# Patient Record
Sex: Female | Born: 1945 | Race: White | Hispanic: No | Marital: Married | Smoking: Never smoker
Health system: Southern US, Community
[De-identification: ages and names within clinical notes are randomized; demographics above are authoritative.]

## PROBLEM LIST (undated history)

## (undated) DIAGNOSIS — K2100 Gastro-esophageal reflux disease with esophagitis, without bleeding: Secondary | ICD-10-CM

## (undated) DIAGNOSIS — M069 Rheumatoid arthritis, unspecified: Secondary | ICD-10-CM

## (undated) DIAGNOSIS — J45909 Unspecified asthma, uncomplicated: Secondary | ICD-10-CM

## (undated) DIAGNOSIS — K5730 Diverticulosis of large intestine without perforation or abscess without bleeding: Secondary | ICD-10-CM

## (undated) DIAGNOSIS — E669 Obesity, unspecified: Secondary | ICD-10-CM

## (undated) DIAGNOSIS — T7840XA Allergy, unspecified, initial encounter: Secondary | ICD-10-CM

## (undated) DIAGNOSIS — M810 Age-related osteoporosis without current pathological fracture: Secondary | ICD-10-CM

## (undated) DIAGNOSIS — C4491 Basal cell carcinoma of skin, unspecified: Secondary | ICD-10-CM

## (undated) DIAGNOSIS — E7800 Pure hypercholesterolemia, unspecified: Secondary | ICD-10-CM

## (undated) DIAGNOSIS — Z86010 Personal history of colonic polyps: Secondary | ICD-10-CM

## (undated) DIAGNOSIS — D509 Iron deficiency anemia, unspecified: Secondary | ICD-10-CM

## (undated) DIAGNOSIS — E039 Hypothyroidism, unspecified: Secondary | ICD-10-CM

## (undated) DIAGNOSIS — I10 Essential (primary) hypertension: Secondary | ICD-10-CM

## (undated) DIAGNOSIS — M1990 Unspecified osteoarthritis, unspecified site: Secondary | ICD-10-CM

## (undated) DIAGNOSIS — Z860101 Personal history of adenomatous and serrated colon polyps: Secondary | ICD-10-CM

## (undated) DIAGNOSIS — K219 Gastro-esophageal reflux disease without esophagitis: Secondary | ICD-10-CM

## (undated) HISTORY — DX: Basal cell carcinoma of skin, unspecified: C44.91

## (undated) HISTORY — DX: Age-related osteoporosis without current pathological fracture: M81.0

## (undated) HISTORY — PX: ENDOMETRIAL ABLATION: SHX621

## (undated) HISTORY — DX: Allergy, unspecified, initial encounter: T78.40XA

## (undated) HISTORY — PX: TONSILLECTOMY: SUR1361

## (undated) HISTORY — DX: Obesity, unspecified: E66.9

## (undated) HISTORY — DX: Iron deficiency anemia, unspecified: D50.9

## (undated) HISTORY — PX: UPPER GASTROINTESTINAL ENDOSCOPY: SHX188

## (undated) HISTORY — DX: Gastro-esophageal reflux disease with esophagitis, without bleeding: K21.00

## (undated) HISTORY — DX: Rheumatoid arthritis, unspecified: M06.9

## (undated) HISTORY — DX: Pure hypercholesterolemia, unspecified: E78.00

## (undated) HISTORY — DX: Personal history of adenomatous and serrated colon polyps: Z86.0101

## (undated) HISTORY — DX: Gastro-esophageal reflux disease with esophagitis: K21.0

## (undated) HISTORY — PX: COLONOSCOPY: SHX174

## (undated) HISTORY — DX: Personal history of colonic polyps: Z86.010

## (undated) HISTORY — PX: CERVICAL CONE BIOPSY: SUR198

## (undated) HISTORY — PX: CATARACT EXTRACTION, BILATERAL: SHX1313

## (undated) HISTORY — DX: Gastro-esophageal reflux disease without esophagitis: K21.9

## (undated) HISTORY — DX: Unspecified asthma, uncomplicated: J45.909

## (undated) HISTORY — DX: Hypothyroidism, unspecified: E03.9

## (undated) HISTORY — DX: Essential (primary) hypertension: I10

## (undated) HISTORY — DX: Diverticulosis of large intestine without perforation or abscess without bleeding: K57.30

## (undated) HISTORY — DX: Unspecified osteoarthritis, unspecified site: M19.90

---

## 1998-01-29 ENCOUNTER — Ambulatory Visit (HOSPITAL_COMMUNITY): Admission: RE | Admit: 1998-01-29 | Discharge: 1998-01-29 | Payer: Self-pay | Admitting: Obstetrics and Gynecology

## 1998-01-29 ENCOUNTER — Encounter: Payer: Self-pay | Admitting: Obstetrics and Gynecology

## 1998-03-06 ENCOUNTER — Other Ambulatory Visit: Admission: RE | Admit: 1998-03-06 | Discharge: 1998-03-06 | Payer: Self-pay | Admitting: Obstetrics and Gynecology

## 1998-07-17 ENCOUNTER — Other Ambulatory Visit: Admission: RE | Admit: 1998-07-17 | Discharge: 1998-07-17 | Payer: Self-pay | Admitting: Obstetrics and Gynecology

## 1999-04-15 ENCOUNTER — Other Ambulatory Visit: Admission: RE | Admit: 1999-04-15 | Discharge: 1999-04-15 | Payer: Self-pay | Admitting: Obstetrics and Gynecology

## 1999-06-05 ENCOUNTER — Encounter: Payer: Self-pay | Admitting: Obstetrics and Gynecology

## 1999-06-05 ENCOUNTER — Ambulatory Visit (HOSPITAL_COMMUNITY): Admission: RE | Admit: 1999-06-05 | Discharge: 1999-06-05 | Payer: Self-pay | Admitting: Obstetrics and Gynecology

## 2000-04-26 ENCOUNTER — Other Ambulatory Visit: Admission: RE | Admit: 2000-04-26 | Discharge: 2000-04-26 | Payer: Self-pay | Admitting: Obstetrics and Gynecology

## 2000-10-06 ENCOUNTER — Ambulatory Visit (HOSPITAL_COMMUNITY): Admission: RE | Admit: 2000-10-06 | Discharge: 2000-10-06 | Payer: Self-pay | Admitting: Obstetrics and Gynecology

## 2000-10-06 ENCOUNTER — Encounter: Payer: Self-pay | Admitting: Obstetrics and Gynecology

## 2001-10-12 ENCOUNTER — Ambulatory Visit (HOSPITAL_COMMUNITY): Admission: RE | Admit: 2001-10-12 | Discharge: 2001-10-12 | Payer: Self-pay | Admitting: Obstetrics and Gynecology

## 2001-10-12 ENCOUNTER — Encounter: Payer: Self-pay | Admitting: Obstetrics and Gynecology

## 2003-06-11 ENCOUNTER — Other Ambulatory Visit: Admission: RE | Admit: 2003-06-11 | Discharge: 2003-06-11 | Payer: Self-pay | Admitting: Obstetrics and Gynecology

## 2004-04-16 ENCOUNTER — Ambulatory Visit: Payer: Self-pay | Admitting: Internal Medicine

## 2005-02-16 ENCOUNTER — Ambulatory Visit: Payer: Self-pay | Admitting: Internal Medicine

## 2005-05-21 ENCOUNTER — Ambulatory Visit: Payer: Self-pay | Admitting: Internal Medicine

## 2005-06-04 ENCOUNTER — Ambulatory Visit: Payer: Self-pay | Admitting: Internal Medicine

## 2005-06-04 ENCOUNTER — Encounter (INDEPENDENT_AMBULATORY_CARE_PROVIDER_SITE_OTHER): Payer: Self-pay | Admitting: *Deleted

## 2005-06-04 DIAGNOSIS — K5730 Diverticulosis of large intestine without perforation or abscess without bleeding: Secondary | ICD-10-CM | POA: Insufficient documentation

## 2005-06-04 DIAGNOSIS — K210 Gastro-esophageal reflux disease with esophagitis: Secondary | ICD-10-CM

## 2006-10-31 ENCOUNTER — Ambulatory Visit: Payer: Self-pay | Admitting: Internal Medicine

## 2007-01-24 ENCOUNTER — Ambulatory Visit: Payer: Self-pay | Admitting: Internal Medicine

## 2007-04-25 ENCOUNTER — Ambulatory Visit: Payer: Self-pay | Admitting: Family Medicine

## 2007-05-04 DIAGNOSIS — E669 Obesity, unspecified: Secondary | ICD-10-CM

## 2007-05-16 ENCOUNTER — Ambulatory Visit: Payer: Self-pay | Admitting: Family Medicine

## 2007-06-23 DIAGNOSIS — J302 Other seasonal allergic rhinitis: Secondary | ICD-10-CM

## 2007-06-23 DIAGNOSIS — E7800 Pure hypercholesterolemia, unspecified: Secondary | ICD-10-CM

## 2007-06-23 DIAGNOSIS — E039 Hypothyroidism, unspecified: Secondary | ICD-10-CM

## 2007-06-23 DIAGNOSIS — J3089 Other allergic rhinitis: Secondary | ICD-10-CM

## 2007-06-23 DIAGNOSIS — Z85828 Personal history of other malignant neoplasm of skin: Secondary | ICD-10-CM | POA: Insufficient documentation

## 2007-06-23 DIAGNOSIS — I10 Essential (primary) hypertension: Secondary | ICD-10-CM

## 2007-08-29 ENCOUNTER — Telehealth: Payer: Self-pay | Admitting: Internal Medicine

## 2007-09-18 ENCOUNTER — Telehealth: Payer: Self-pay | Admitting: Internal Medicine

## 2007-09-19 ENCOUNTER — Encounter: Payer: Self-pay | Admitting: Internal Medicine

## 2007-12-25 ENCOUNTER — Telehealth: Payer: Self-pay | Admitting: Internal Medicine

## 2008-03-29 ENCOUNTER — Telehealth: Payer: Self-pay | Admitting: Internal Medicine

## 2008-08-28 ENCOUNTER — Telehealth: Payer: Self-pay | Admitting: Internal Medicine

## 2008-09-26 ENCOUNTER — Encounter: Payer: Self-pay | Admitting: Internal Medicine

## 2008-09-27 ENCOUNTER — Encounter: Payer: Self-pay | Admitting: Internal Medicine

## 2008-09-30 ENCOUNTER — Telehealth: Payer: Self-pay | Admitting: Internal Medicine

## 2008-11-06 ENCOUNTER — Telehealth: Payer: Self-pay | Admitting: Internal Medicine

## 2008-11-11 ENCOUNTER — Ambulatory Visit: Payer: Self-pay | Admitting: Internal Medicine

## 2008-11-11 DIAGNOSIS — M0579 Rheumatoid arthritis with rheumatoid factor of multiple sites without organ or systems involvement: Secondary | ICD-10-CM

## 2008-11-14 ENCOUNTER — Encounter: Payer: Self-pay | Admitting: Internal Medicine

## 2009-06-16 ENCOUNTER — Encounter: Payer: Self-pay | Admitting: Internal Medicine

## 2010-02-25 ENCOUNTER — Ambulatory Visit: Payer: Self-pay | Admitting: Internal Medicine

## 2010-04-23 NOTE — Assessment & Plan Note (Signed)
Summary: sick per pt//kcw   Copy to:  n/a Primary Provider/Referring Provider:  Tracey Harries, MD  CC:  Acute visit-has been using Neti pot; congestion and cough increased.Marland Kitchen  History of Present Illness: February 25, 2010- Allergic rhinitis, Cough 64 yo never smoker wih hx allergic rhinitis, asthma and GERD, previously followed at [facility]. Old chart reviewed.  Acute visit- Dry cough since she started lisinopril in January. Cough more frequent and also nasal stuffiness x 3 weeks. More recent sneeze and yellow nasal. Had some sore throat and frontal headache. Neti pot not helping. No hx asthma, but a little cough or wheeze if she laughs. Prilosec controls reflux.   Preventive Screening-Counseling & Management  Alcohol-Tobacco     Smoking Status: never  Current Medications (verified): 1)  Prilosec Otc 20 Mg  Tbec (Omeprazole Magnesium) .Marland Kitchen.. 1 Twice A Day 30 Minutes Before Meals 2)  Plaquenil 200 Mg Tabs (Hydroxychloroquine Sulfate) .... One Tablet By Mouth Two Times A Day 3)  Red Yeast Rice 600 Mg Caps (Red Yeast Rice Extract) .... Take 1 By Mouth Once Daily 4)  Multivitamins   Tabs (Multiple Vitamin) .... One Tablet By Mouth Once Daily 5)  Fish Oil   Oil (Fish Oil) .... One Tablet By Mouth Once Daily 6)  Liothyronnie Sodium(T3)thyroxine L (T4) (Compound Mixed) .... Take 1 By Mouth  Every Morning 7)  Osteohealth 4 .... Take 1 By Mouth Once Daily 8)  Vitamin D-3 5000 Unit Tabs (Cholecalciferol) .... Take 1 By Mouth Twice A Week 9)  Glucosamine Sulfate 750 Mg Tabs (Glucosamine Sulfate) .... Take 1 By Mouth Once Daily 10)  Msm 1000 Mg Caps (Methylsulfonylmethane) .... Take 1 By Mouth Once Daily 11)  Lisinopril 5 Mg Tabs (Lisinopril) .... Take 1 By Mouth Once Daily  Allergies (verified): 1)  ! Tetracycline 2)  ! Neosporin  Past History:  Past Surgical History: Last updated: 06/23/2007 Tonsillectomy  Family History: Last updated: 02/25/2010 Family History of Colon Cancer: Maternal 1st  Cousin Family History of Diabetes: Maternal Grandmother Family History of Heart Disease: Mother Jerrye Beavers) Father- died 87 of CVA and Parkinson's with pneumonia Brother- died 40 of lung cancer/ smoker  Social History: Last updated: 11/07/2008 Occupation: Education Alcohol Use - no Illicit Drug Use - no  Risk Factors: Smoking Status: never (02/25/2010)  Past Medical History:   ALLERGY (ICD-995.3)- Allergy vaccine 1994 CARCINOMA, BASAL CELL (ICD-173.9) HYPOTHYROIDISM (ICD-244.9) HYPERCHOLESTEROLEMIA (ICD-272.0) HYPERTENSION (ICD-401.9) DIVERTICULOSIS, COLON (ICD-562.10) ESOPHAGITIS, REFLUX (ICD-530.11) OBESITY, UNSPECIFIED (ICD-278.00)  Family History: Family History of Colon Cancer: Maternal 1st Cousin Family History of Diabetes: Maternal Grandmother Family History of Heart Disease: Mother Jerrye Beavers) Father- died 25 of CVA and Parkinson's with pneumonia Brother- died 23 of lung cancer/ smoker  Social History: Smoking Status:  never  Review of Systems      See HPI       The patient complains of headaches, nasal congestion/difficulty breathing through nose, and sneezing.  The patient denies shortness of breath with activity, shortness of breath at rest, productive cough, non-productive cough, coughing up blood, chest pain, irregular heartbeats, acid heartburn, indigestion, loss of appetite, weight change, abdominal pain, difficulty swallowing, sore throat, and tooth/dental problems.    Vital Signs:  Patient profile:   65 year old female Height:      63 inches Weight:      188.13 pounds BMI:     33.45 O2 Sat:      94 % on Room air Pulse rate:   75 / minute BP sitting:  142 / 74  (left arm) Cuff size:   regular  Vitals Entered By: Reynaldo Minium CMA (February 25, 2010 10:44 AM)  O2 Flow:  Room air CC: Acute visit-has been using Neti pot; congestion, cough increased.   Physical Exam  Additional Exam:  General: A/Ox3; pleasant and cooperative, NAD, overweight,  NAD SKIN: no rash, lesions NODES: no lymphadenopathy HEENT: [place]/AT, EOM- WNL, Conjuctivae- clear, PERRLA, TM-WNL, Nose- clear, Throat- patchy red, no drainage, Mallampati  II NECK: Supple w/ fair ROM, JVD- none, normal carotid impulses w/o bruits Thyroid- normal to palpation CHEST: Clear to P&A HEART: RRR, no m/g/r heard ABDOMEN: Soft and nl; nml bowel sounds; no organomegaly or masses noted ZOX:WRUE, nl pulses, no edema  NEURO: Grossly intact to observation       Impression & Recommendations:  Problem # 1:  ALLERGY (ICD-995.3)  Allergic rhinitis- seasonal issues.  She may be getting a mild sinus infection. If we can't open her readily, she may need an antibiotic. will give neb and depo, with script for Z pak. Consider decongestant despite BP  We discussed lisininopril. She will contact Dr  Everlene Other for change from lisinopril  Medications Added to Medication List This Visit: 1)  Red Yeast Rice 600 Mg Caps (Red yeast rice extract) .... Take 1 by mouth once daily 2)  Liothyronnie Sodium(t3)thyroxine L (t4) (compound Mixed)  .... Take 1 by mouth  every morning 3)  Osteohealth 4  .... Take 1 by mouth once daily 4)  Vitamin D-3 5000 Unit Tabs (Cholecalciferol) .... Take 1 by mouth twice a week 5)  Glucosamine Sulfate 750 Mg Tabs (Glucosamine sulfate) .... Take 1 by mouth once daily 6)  Msm 1000 Mg Caps (Methylsulfonylmethane) .... Take 1 by mouth once daily 7)  Lisinopril 5 Mg Tabs (Lisinopril) .... Take 1 by mouth once daily 8)  Zithromax Z-pak 250 Mg Tabs (Azithromycin) .... 2 today then one daily  Other Orders: Est. Patient Level III (45409) Depo- Medrol 80mg  (J1040) Admin of Therapeutic Inj  intramuscular or subcutaneous (81191) Nebulizer Tx (47829)  Patient Instructions: 1)  Please schedule a follow-up appointment in 1 year. 2)  Please call Dr Everlene Other and explain that the lisinopril is causing persistent cough and you would like to change from that.  3)  neb neo nasal 4)   depo 80 5)  script to hold for Z pak antibiotic Prescriptions: ZITHROMAX Z-PAK 250 MG TABS (AZITHROMYCIN) 2 today then one daily  #1 pak x 0   Entered and Authorized by:   Waymon Budge MD   Signed by:   Waymon Budge MD on 02/25/2010   Method used:   Print then Give to Patient   RxID:   [id number]      Medication Administration  Injection # 1:    Medication: Depo- Medrol 80mg     Diagnosis: ALLERGY (ICD-995.3)    Route: IM    Site: RUOQ gluteus    Exp Date: 07/2012    Lot #: [id number]    Mfr: Pharmacia    Patient tolerated injection without complications    Given by: Elray Buba RN (February 25, 2010 11:49 AM)  Medication # 1:    Medication: EMR miscellaneous medications    Diagnosis: ALLERGY (ICD-995.3)    Dose: 3 gtts.    Route: intranasal    Exp Date: 04/13    Lot #: [id number]    Mfr: BAYER    Comments: Neo-Synephrine    Patient tolerated medication without complications  Given by: Elray Buba RN (February 25, 2010 11:51 AM)

## 2010-04-23 NOTE — Miscellaneous (Signed)
Summary: Prilosec Rx  Clinical Lists Changes  Medications: Rx of PRILOSEC OTC 20 MG  TBEC (OMEPRAZOLE MAGNESIUM) 1 twice a day 30 minutes before meals;  #60 x 4;  Signed;  Entered by: Hortense Ramal CMA (AAMA);  Authorized by: Hart Carwin MD;  Method used: Electronically to [facility] [address], [place], [place]  [id number], Ph: [phone], Fax: [phone]    Prescriptions: PRILOSEC OTC 20 MG  TBEC (OMEPRAZOLE MAGNESIUM) 1 twice a day 30 minutes before meals  #60 x 4   Entered by:   Hortense Ramal CMA (AAMA)   Authorized by:   Hart Carwin MD   Signed by:   Hortense Ramal CMA (AAMA) on 06/16/2009   Method used:   Electronically to        [facility]* (retail)       [address]       [place], [place]  [id number]       Ph: [phone]       Fax: [phone]   RxID:   [id number]

## 2010-08-04 NOTE — Assessment & Plan Note (Signed)
[facility]                         GASTROENTEROLOGY OFFICE NOTE   NAME:Sally Diaz, Sally Diaz                     MRN:          [id number]  DATE:10/31/2006                            DOB:          [birth year]    Ms. Colvin is a 65 year old white female with a history of  gastroesophageal reflux, asthmatic bronchitis and history of colon  polyps removed on colonoscopy by Dr. Madilyn Fireman in 2004.  She has since then  had a repeat colonoscopy in March of 2003 with findings of moderate to  severe diverticulosis of the left colon, but no evidence of recurrent  polyps.  Her recall colonoscopy was set for a 7-year interval, which  would be March 2014.  As far as her gastroesophageal reflux is  concerned, it is well controlled on Aciphex 20 mg daily.  She  discontinued the Aciphex for a few days, only to find out that she has  recurrent reflux.  She denies any nocturnal coughing, hoarseness or food  regurgitation, as long as she takes the Aciphex.  On upper endoscopy on  June 04, 2005, biopsies of the GE junction showed minimal inflammation,  consistent with gastroesophageal reflux but no intestinal metaplasia.   PHYSICAL EXAMINATION:  VITAL SIGNS:  Blood pressure 130/78, pulse 68.  Weight 188 pounds.  The patient was not re-examined today.   IMPRESSION:  A 65 year old white female with chronic gastroesophageal  reflux disease without histologic evidence of Barrett's esophagus.  The  patient is dependent on a proton pump inhibitor to remain her in  remission.   PLAN:  Refill for Aciphex 20 mg daily.  We will send it by E-prescibing  to [facility].  She was also interested in starting 3 months of  mail order Aciphex, and I gave her a prescription for that.  She will  return in 1 year.     Hedwig Morton. Juanda Chance, MD  Electronically Signed    DMB/MedQ  DD: 10/31/2006  DT: 11/01/2006  Job #: [id number]   cc:   Buren Kos, M.D.

## 2011-01-05 ENCOUNTER — Other Ambulatory Visit: Payer: Self-pay | Admitting: *Deleted

## 2011-01-05 MED ORDER — OMEPRAZOLE MAGNESIUM 20 MG PO TBEC: 20.0000 mg | DELAYED_RELEASE_TABLET | Freq: Two times a day (BID) | ORAL | Status: DC

## 2011-02-24 ENCOUNTER — Encounter: Payer: Self-pay | Admitting: Internal Medicine

## 2011-02-25 ENCOUNTER — Ambulatory Visit: Payer: Self-pay | Admitting: Internal Medicine

## 2011-03-12 ENCOUNTER — Encounter: Payer: Self-pay | Admitting: *Deleted

## 2011-03-19 ENCOUNTER — Ambulatory Visit (INDEPENDENT_AMBULATORY_CARE_PROVIDER_SITE_OTHER): Payer: Medicare Other | Admitting: Internal Medicine

## 2011-03-19 ENCOUNTER — Encounter: Payer: Self-pay | Admitting: Internal Medicine

## 2011-03-19 VITALS — BP 132/76 | HR 88 | Ht 63.0 in | Wt 187.0 lb

## 2011-03-19 DIAGNOSIS — K219 Gastro-esophageal reflux disease without esophagitis: Secondary | ICD-10-CM

## 2011-03-19 MED ORDER — OMEPRAZOLE MAGNESIUM 20 MG PO TBEC: 20.0000 mg | DELAYED_RELEASE_TABLET | Freq: Two times a day (BID) | ORAL | Status: DC

## 2011-03-19 NOTE — Progress Notes (Signed)
Sally Diaz Aug 05, 1945 MRN [id number]    History of Present Illness:  This is a 65 year old white female with chronic gastroesophageal reflux controlled on Prilosec 20 mg once a day or twice a day. Her last upper endoscopy in March 2007 showed esophagitis. There was no Barrett's esophagus. Her symptoms are perfectly well controlled on Prilosec. She needs a refill today. She was diagnosed with rheumatoid arthritis and has been started on Plaquenil and most recently low-dose prednisone which will be completed tomorrow. She has a history of adenomatous polyp of the colon and a family history of colon cancer in a first cousin. She had moderately severe diverticulosis on a colonoscopy in March 2007.   Past Medical History  Diagnosis Date  . Allergy, unspecified not elsewhere classified   . Basal cell carcinoma   . Hypothyroidism   . Hypercholesterolemia   . Hypertension   . Diverticulosis of colon   . Reflux esophagitis   . Obesity   . Hx of adenomatous colonic polyps   . Anemia, iron deficiency    Past Surgical History  Procedure Date  . Tonsillectomy     reports that she has never smoked. She has never used smokeless tobacco. She reports that she drinks alcohol. She reports that she does not use illicit drugs. family history includes Cancer in her mother; Colon cancer in an unspecified family member; Diabetes in her maternal grandmother; Heart disease in her mother; Lung cancer in her brother; Pneumonia in her father; and Stroke in her father. Allergies  Allergen Reactions  . Tetracycline   . Triple Antibiotic         Review of Systems: Denies dysphagia, odynophagia, chest pain or shortness of breath  The remainder of the 10 point ROS is negative except as outlined in H&P   Physical Exam: General appearance  Well developed, in no distress. Eyes- non icteric. HEENT nontraumatic, normocephalic. Mouth no lesions, tongue papillated, no cheilosis. Neck supple without  adenopathy, thyroid not enlarged, no carotid bruits, no JVD. Lungs Clear to auscultation bilaterally. Cor normal S1, normal S2, regular rhythm, no murmur,  quiet precordium. Abdomen: Soft, nontender. Rectal: Not done Extremities no pedal edema. Skin no lesions. Neurological alert and oriented x 3. Psychological normal mood and affect.  Assessment and Plan:  Problem #1 Chronic gastroesophageal reflux disease controlled on Prilosec 20 mg twice a day. We will refill her Prilosec today. She will continue antireflux measures.  Problem #2 Colorectal screening. Patient has a personal history of adenomatous polyps. She will be due for a recall colonoscopy in March 2014.   03/19/2011 Lina Sar

## 2011-03-19 NOTE — Patient Instructions (Addendum)
We have sent the following medications to your pharmacy for you to pick up at your convenience: Prilosec If you have problems with your Prilosec prescription, please call Dottie at [phone] and I will contact the pharmacy. CC: Dr Everlene Other, Dr Corliss Skains

## 2011-09-02 ENCOUNTER — Other Ambulatory Visit: Payer: Self-pay | Admitting: Internal Medicine

## 2012-01-12 ENCOUNTER — Other Ambulatory Visit: Payer: Self-pay | Admitting: *Deleted

## 2012-01-12 MED ORDER — OMEPRAZOLE MAGNESIUM 20 MG PO TBEC: 20.0000 mg | DELAYED_RELEASE_TABLET | Freq: Two times a day (BID) | ORAL | Status: DC

## 2012-05-12 ENCOUNTER — Other Ambulatory Visit: Payer: Self-pay | Admitting: Internal Medicine

## 2012-05-19 ENCOUNTER — Encounter: Payer: Self-pay | Admitting: *Deleted

## 2012-05-25 ENCOUNTER — Encounter: Payer: Self-pay | Admitting: Internal Medicine

## 2012-05-28 ENCOUNTER — Other Ambulatory Visit: Payer: Self-pay | Admitting: Internal Medicine

## 2012-06-13 ENCOUNTER — Telehealth: Payer: Self-pay | Admitting: Internal Medicine

## 2012-06-13 MED ORDER — OMEPRAZOLE MAGNESIUM 20 MG PO TBEC: DELAYED_RELEASE_TABLET | ORAL | Status: DC

## 2012-06-13 NOTE — Telephone Encounter (Signed)
rx sent until patient's appointment 07/18/12

## 2012-07-04 ENCOUNTER — Other Ambulatory Visit: Payer: Self-pay | Admitting: Internal Medicine

## 2012-07-10 ENCOUNTER — Telehealth: Payer: Self-pay | Admitting: Internal Medicine

## 2012-07-10 NOTE — Telephone Encounter (Signed)
I spoke with pt. She is scheduled to come in and see CDY 07/11/12 at 1:30. Pt needed nothing further

## 2012-07-11 ENCOUNTER — Ambulatory Visit (INDEPENDENT_AMBULATORY_CARE_PROVIDER_SITE_OTHER): Payer: Medicare Other | Admitting: Internal Medicine

## 2012-07-11 ENCOUNTER — Encounter: Payer: Self-pay | Admitting: Internal Medicine

## 2012-07-11 VITALS — BP 112/76 | HR 94 | Ht 62.5 in | Wt 192.8 lb

## 2012-07-11 DIAGNOSIS — J209 Acute bronchitis, unspecified: Secondary | ICD-10-CM

## 2012-07-11 DIAGNOSIS — T7840XA Allergy, unspecified, initial encounter: Secondary | ICD-10-CM

## 2012-07-11 MED ORDER — PROMETHAZINE-CODEINE 6.25-10 MG/5ML PO SYRP: 5.0000 mL | ORAL_SOLUTION | Freq: Four times a day (QID) | ORAL | Status: DC | PRN

## 2012-07-11 MED ORDER — AZITHROMYCIN 250 MG PO TABS: ORAL_TABLET | ORAL | Status: DC

## 2012-07-11 NOTE — Patient Instructions (Addendum)
Neb xop 0.63  Depo 80  Scripts for Zpak antibiotic and for cough syrup  Please call as needed

## 2012-07-11 NOTE — Progress Notes (Signed)
07/11/12- 66yoF never smoker followed for allergic rhinitis  FOLLOWS FOR: last seen 02-2010; pt states she is having trouble with breathing and cough; was around old house and raking leaves. Started Mucinex to help to bring up mucus. Also, states she is loosing her voice. Drainage in nasal area is green. Starting 10 days ago when she rates leaves and cleaned an old house that was mold in dusty. Green from nose, white from chest. Had some frontal headache initially and some wheeze with cough. Husband had had a cold. She took Mucinex, old cough syrup and cough drops.  ROS-see HPI Constitutional:   No-   weight loss, night sweats, fevers, chills, fatigue, lassitude. HEENT:   + headaches, difficulty swallowing, tooth/dental problems, sore throat,       No-  sneezing, itching, ear ache, +nasal congestion, +post nasal drip,  CV:  No-   chest pain, orthopnea, PND, swelling in lower extremities, anasarca,                                  dizziness, palpitations Resp: No-   shortness of breath with exertion or at rest.              + productive cough,  No non-productive cough,  No- coughing up of blood.              No-   change in color of mucus.  + wheezing.   Skin: No-   rash or lesions. GI:  No-   heartburn, indigestion, abdominal pain, nausea, vomiting, GU:  MS:  No-   joint pain or swelling.   Neuro-     nothing unusual Psych:  No- change in mood or affect. No depression or anxiety.  No memory loss.  OBJ- Physical Exam General- Alert, Oriented, Affect-appropriate, Distress- none acute Skin- rash-none, lesions- none, excoriation- none Lymphadenopathy- none Head- atraumatic            Eyes- Gross vision intact, PERRLA, conjunctivae and secretions clear            Ears- Hearing, canals-normal            Nose- Clear, no-Septal dev, mucus, polyps, erosion, perforation             Throat- Mallampati II , +mucosa red , drainage- none, tonsils- atrophic. +Hoarse Neck- flexible , trachea midline, no  stridor , thyroid nl, carotid no bruit Chest - symmetrical excursion , unlabored           Heart/CV- RRR , no murmur , no gallop  , no rub, nl s1 s2                           - JVD- none , edema- none, stasis changes- none, varices- none           Lung- clear to P&A, wheeze- none, cough- none , dullness-none, rub- none           Chest wall-  Abd-  Br/ Gen/ Rectal- Not done, not indicated Extrem- cyanosis- none, clubbing, none, atrophy- none, strength- nl Neuro- grossly intact to observation

## 2012-07-12 DIAGNOSIS — T7840XA Allergy, unspecified, initial encounter: Secondary | ICD-10-CM

## 2012-07-12 MED ORDER — METHYLPREDNISOLONE ACETATE 80 MG/ML IJ SUSP
80.0000 mg | Freq: Once | INTRAMUSCULAR | Status: AC
  Administered 2012-07-12: 80 mg via INTRAMUSCULAR

## 2012-07-12 MED ORDER — LEVALBUTEROL HCL 0.63 MG/3ML IN NEBU
0.6300 mg | INHALATION_SOLUTION | Freq: Once | RESPIRATORY_TRACT | Status: AC
  Administered 2012-07-12: 0.63 mg via RESPIRATORY_TRACT

## 2012-07-14 ENCOUNTER — Telehealth: Payer: Self-pay | Admitting: Internal Medicine

## 2012-07-14 MED ORDER — PREDNISONE 10 MG PO TABS: ORAL_TABLET | ORAL | Status: DC

## 2012-07-14 NOTE — Telephone Encounter (Signed)
Called and spoke with pt and she is aware of CY recs to start on the pred taper.  This has been sent in to the pharmacy per pts request and pt is aware that if she gets worse over the weekend to go to the Urgent care for eval. Pt voiced her understanding and nothing further is needed.

## 2012-07-14 NOTE — Telephone Encounter (Signed)
Called and spoke with pt and she stated that she is using the cough syrup every 6 hours now.  She started the zpak yesterday, and is coughing up yellow sputum now.  She stated that she is having right ear pain now.  She is unable to sleep at night due to all the coughing and she is unable to lay down at night.  Pt is aware that the abx will take a couple of days to start to help.  Pt wanted to know if there is anything else that she should be doing.  Should she start back on the mucinex?   Any other recs to help?  CY is out of the office today.  VS please advise. Thanks  Allergies  Allergen Reactions  . Neomycin-Bacitracin Zn-Polymyx   . Tetracycline

## 2012-07-14 NOTE — Telephone Encounter (Signed)
Spoke with CY-okay to give patient Prednisone 10 mg #20 take 4 x 2 days, 3 x 2 days, 2 x 2 days, 1 x 2 days, then stop no refills and let patient know if her ear pain worsens then she should go to Urgent Care over the weekend to get it looked at.

## 2012-07-17 DIAGNOSIS — J209 Acute bronchitis, unspecified: Secondary | ICD-10-CM | POA: Insufficient documentation

## 2012-07-17 DIAGNOSIS — J45909 Unspecified asthma, uncomplicated: Secondary | ICD-10-CM | POA: Insufficient documentation

## 2012-07-17 NOTE — Assessment & Plan Note (Signed)
Acute exacerbation of rhinitis with sinusitis. Effectively this is an upper respiratory infection with asthma and bronchitis. Hard to sort out how much she caught a viral infection from her husband and how much is allergy from trying to clean a moldy, dusty old house Plan Depo-Medrol, Z-Pak to hold

## 2012-07-17 NOTE — Assessment & Plan Note (Signed)
Plan-nebulizer with Xopenex, Depo-Medrol, Z-Pak to hold

## 2012-07-18 ENCOUNTER — Encounter: Payer: Self-pay | Admitting: Internal Medicine

## 2012-07-18 ENCOUNTER — Ambulatory Visit (INDEPENDENT_AMBULATORY_CARE_PROVIDER_SITE_OTHER): Payer: Medicare Other | Admitting: Internal Medicine

## 2012-07-18 VITALS — BP 132/80 | HR 88 | Ht 62.5 in | Wt 196.0 lb

## 2012-07-18 DIAGNOSIS — K219 Gastro-esophageal reflux disease without esophagitis: Secondary | ICD-10-CM

## 2012-07-18 MED ORDER — OMEPRAZOLE MAGNESIUM 20 MG PO TBEC: 20.0000 mg | DELAYED_RELEASE_TABLET | Freq: Two times a day (BID) | ORAL | Status: DC

## 2012-07-18 NOTE — Patient Instructions (Addendum)
We have sent the following medications to your pharmacy for you to pick up at your convenience: Omeprazole 20 mg twice daily  CC: Dr Tracey Harries

## 2012-07-18 NOTE — Progress Notes (Signed)
Sally Diaz 1945/08/09 MRN [id number]  History of Present Illness:  This is a 67 year old white female with chronic gastroesophageal reflux who comes for refills of Prilosec 20 mg twice a day. Her last upper endoscopy in March 2007 showed mild esophagitis. Biopsies were negative for Barrett's esophagus. She has a history of rheumatoid arthritis. A colorectal screening was done in 1997 when she had an adenomatous polyp. Her last colonoscopy in March 2007 did not show any recurrent polyps. She has a positive family history of colon cancer in her paternal cousin who died at the age of 47. She denies nocturnal cough, dysphagia, or chest pain. She has gained about 20 pounds.   Past Medical History  Diagnosis Date  . Allergy, unspecified not elsewhere classified   . Basal cell carcinoma   . Hypothyroidism   . Hypercholesterolemia   . Hypertension   . Diverticulosis of colon   . Reflux esophagitis   . Obesity   . Hx of adenomatous colonic polyps   . Anemia, iron deficiency    Past Surgical History  Procedure Laterality Date  . Tonsillectomy      reports that she has never smoked. She has never used smokeless tobacco. She reports that  drinks alcohol. She reports that she does not use illicit drugs. family history includes Bladder Cancer in her mother; Colon cancer in her cousin; Diabetes in her maternal grandmother; Heart disease in her mother; Lung cancer in her brother; Pneumonia in her father; and Stroke in her father. Allergies  Allergen Reactions  . Neomycin-Bacitracin Zn-Polymyx   . Tetracycline         Review of Systems: No change in bowel habits.  The remainder of the 10 point ROS is negative except as outlined in H&P   Physical Exam: General appearance  Well developed, in no distress. Eyes- non icteric. HEENT nontraumatic, normocephalic. Mouth no lesions, tongue papillated, no cheilosis. Neck supple without adenopathy, thyroid not enlarged, no carotid bruits, no  JVD. Lungs Clear to auscultation bilaterally. Expiratory wheezes. Productive cough present. Cor normal S1, normal S2, regular rhythm, no murmur,  quiet precordium. Abdomen: Soft nontender. Rectal: Not done. Extremities no pedal edema. Skin no lesions. Neurological alert and oriented x 3. Psychological normal mood and affect.  Assessment and Plan:  Problem #41 67 year old white female with gastroesophageal reflux under good control with Prilosec 20 mg twice a day. She may reduce the medication to one a day if it controls her symptoms. We will refill it for one year.  Problem #2 Colorectal screening. Patient's last colonoscopy was in March 2007. No polyps were found. Her next colonoscopy will be due in March 2017.   07/18/2012 Lina Sar

## 2012-08-01 ENCOUNTER — Other Ambulatory Visit: Payer: Self-pay | Admitting: Internal Medicine

## 2013-01-24 ENCOUNTER — Other Ambulatory Visit: Payer: Self-pay | Admitting: Internal Medicine

## 2013-07-12 ENCOUNTER — Encounter: Payer: Self-pay | Admitting: Internal Medicine

## 2013-07-12 ENCOUNTER — Ambulatory Visit (INDEPENDENT_AMBULATORY_CARE_PROVIDER_SITE_OTHER): Payer: Medicare Other | Admitting: Internal Medicine

## 2013-07-12 VITALS — BP 144/82 | HR 74 | Ht 63.0 in | Wt 192.8 lb

## 2013-07-12 DIAGNOSIS — K2100 Gastro-esophageal reflux disease with esophagitis, without bleeding: Secondary | ICD-10-CM

## 2013-07-12 DIAGNOSIS — J309 Allergic rhinitis, unspecified: Secondary | ICD-10-CM

## 2013-07-12 DIAGNOSIS — J302 Other seasonal allergic rhinitis: Secondary | ICD-10-CM

## 2013-07-12 DIAGNOSIS — J3089 Other allergic rhinitis: Principal | ICD-10-CM

## 2013-07-12 NOTE — Progress Notes (Signed)
07/11/12- 66yoF never smoker followed for allergic rhinitis  FOLLOWS FOR: last seen 02-2010; pt states she is having trouble with breathing and cough; was around old house and raking leaves. Started Mucinex to help to bring up mucus. Also, states she is loosing her voice. Drainage in nasal area is green. Starting 10 days ago when she rates leaves and cleaned an old house that was mold in dusty. Green from nose, white from chest. Had some frontal headache initially and some wheeze with cough. Husband had had a cold. She took Mucinex, old cough syrup and cough drops.  07/12/13- 67 yoF never smoker followed for allergic rhinitis, complicated by RA, HBP, GERD  FOLLOWS FOR: Pt states she is doing very well. Pt notices intermittent cough with and without mucus. Denies SOB and CP. Reports a good year with no special problems since last here. Trying to wean off Prilosec so we discussed interaction of reflux with respiratory complaints  ROS-see HPI Constitutional:   No-   weight loss, night sweats, fevers, chills, fatigue, lassitude. HEENT:   + headaches, difficulty swallowing, tooth/dental problems, sore throat,       No-  sneezing, itching, ear ache, +nasal congestion, post nasal drip,  CV:  No-   chest pain, orthopnea, PND, swelling in lower extremities, anasarca,                                                         dizziness, palpitations Resp: No-   shortness of breath with exertion or at rest.              No-productive cough,  No non-productive cough,  No- coughing up of blood.              No-   change in color of mucus.  No-wheezing.   Skin: No-   rash or lesions. GI:  No-   heartburn, indigestion, abdominal pain, nausea, vomiting, GU:  MS:  No-   joint pain or swelling.   Neuro-     nothing unusual Psych:  No- change in mood or affect. No depression or anxiety.  No memory loss.  OBJ- Physical Exam General- Alert, Oriented, Affect-appropriate, Distress- none acute Skin- rash-none, lesions-  none, excoriation- none Lymphadenopathy- none Head- atraumatic            Eyes- Gross vision intact, PERRLA, conjunctivae and secretions clear            Ears- Hearing, canals-normal            Nose- Clear, no-Septal dev, mucus, polyps, erosion, perforation             Throat- Mallampati II , mucosa clear , drainage- none, tonsils- atrophic.  Neck- flexible , trachea midline, no stridor , thyroid nl, carotid no bruit Chest - symmetrical excursion , unlabored           Heart/CV- RRR , no murmur , no gallop  , no rub, nl s1 s2                           - JVD- none , edema- none, stasis changes- none, varices- none           Lung- clear to P&A, wheeze- none, cough- none , dullness-none, rub- none  Chest wall-  Abd-  Br/ Gen/ Rectal- Not done, not indicated Extrem- cyanosis- none, clubbing, none, atrophy- none, strength- nl Neuro- grossly intact to observation

## 2013-07-12 NOTE — Patient Instructions (Signed)
If you want to try tapering off Prilosec, try changing to a different mechanism like otc ranitidine (Zantac) and gradually taper off that.   We will be happy to see you as needed

## 2013-08-08 NOTE — Assessment & Plan Note (Signed)
Good control especially for the spring season

## 2013-08-08 NOTE — Assessment & Plan Note (Signed)
Says she wants to get off Prilosec, we discussed rebound. She is going to switch off of Prilosec to ranitidine and then taper from that as tolerated.

## 2014-03-09 ENCOUNTER — Other Ambulatory Visit: Payer: Self-pay | Admitting: Internal Medicine

## 2014-07-01 ENCOUNTER — Telehealth: Payer: Self-pay | Admitting: Internal Medicine

## 2014-07-01 NOTE — Telephone Encounter (Signed)
Sharyn Lull, Please contact pt first and see what her concerns are. Thanks.

## 2014-07-01 NOTE — Telephone Encounter (Signed)
Suggest she first try prednisone 10 mg, # 7, 1 daily. If this doesn't help let us know.

## 2014-07-01 NOTE — Telephone Encounter (Signed)
Pt aware of rec's per CY. [place] the [place] and will not be home until next week.  Pt states that she may just wait and keep this on hand as a preventative option in case symptoms worsen.  Pt states that she is not "sick" she is just having some chest tightness and a little wheezing.  Pt denies any congestion in chest. Pt does have some PND and a little sinus congestion. Pt is wanting to know if maybe she needs a type of inhaler to control her asthma?? Pt states that she is beginning to wonder if it is her asthma acting up with her [place] the mountains and the [place]?   Pt has appt 08/26/14 for 1 year recheck.  Pt wanting to know if she can be fit in next week to be seen when she returns back home.  Pt states that she is okay with waiting until 07/02/14 AM for response.  Please advise Dr Annamaria Boots. Thanks.  Allergies  Allergen Reactions  . Neomycin-Bacitracin Zn-Polymyx   . Tetracycline      Medication List       This list is accurate as of: 07/01/14  5:33 PM.  Always use your most recent med list.               calcium carbonate 200 MG capsule  Take 250 mg by mouth 2 (two) times daily with a meal.     clobetasol cream 0.05 %  Commonly known as:  TEMOVATE  Apply topically as needed.     cyclobenzaprine 10 MG tablet  Commonly known as:  FLEXERIL  Take 10 mg by mouth as needed.     DHEA PO  Take 4 mg by mouth every morning.     fish oil-omega-3 fatty acids 1000 MG capsule  Take 2 capsules by mouth daily. 1 daily     hydroxychloroquine 200 MG tablet  Commonly known as:  PLAQUENIL  Take 200 mg by mouth 2 (two) times daily.     irbesartan 150 MG tablet  Commonly known as:  AVAPRO  Take 150 mg by mouth daily.     LIOTHYRONINE SODIUM PO  Take 1 tablet by mouth daily. T4, T3 compounded medication     Melatonin 1 MG Tabs  Take 0.05 tablets by mouth at bedtime.     multivitamin tablet  Take 2 tablets by mouth daily.     NON FORMULARY  Progesterone compound 25mg   take 1 tablet daily     PRILOSEC OTC 20 MG tablet  Generic drug:  omeprazole  TAKE 1 TABLET TWICE DAILY, 30 MINUTES BEFORE MEALS     promethazine-codeine 6.25-10 MG/5ML syrup  Commonly known as:  PHENERGAN with CODEINE  Take 5 mLs by mouth every 6 (six) hours as needed for cough.     TART CHERRY ADVANCED PO  Take 1 tablet by mouth 2 (two) times daily.     Vitamin D3 2000 UNITS Tabs  Take 1 tablet by mouth daily.

## 2014-07-01 NOTE — Telephone Encounter (Signed)
Patient needs appointment, all of Dr. Janee Morn appointments are blocked.    Sally Diaz - can you give this patient an appointment please?

## 2014-07-01 NOTE — Telephone Encounter (Signed)
Patient is having allergy troubles- sinus congestion, PND, sob.

## 2014-07-02 MED ORDER — ALBUTEROL SULFATE HFA 108 (90 BASE) MCG/ACT IN AERS: INHALATION_SPRAY | RESPIRATORY_TRACT | Status: DC

## 2014-07-02 NOTE — Telephone Encounter (Signed)
We can add patient to 07-10-14 at 11:15am. Thanks.

## 2014-07-02 NOTE — Telephone Encounter (Signed)
Dr. Young please advise.  Thanks! 

## 2014-07-02 NOTE — Telephone Encounter (Signed)
Spoke with pt and advised of appt time with Dr Annamaria Boots and also of rx for Proventil sent to pharmacy.

## 2014-07-02 NOTE — Telephone Encounter (Signed)
Sally Diaz- please see if we have a schedule space for her in next couple of weeks. Meanwhile offer albuterol HFA rescue inhaler, # 1,   2 puffs every 4-6 hours as needed

## 2014-07-02 NOTE — Telephone Encounter (Signed)
Spoke with pt and advised of appt time with Dr Annamaria Boots and also r

## 2014-07-10 ENCOUNTER — Ambulatory Visit (INDEPENDENT_AMBULATORY_CARE_PROVIDER_SITE_OTHER): Payer: Medicare Other | Admitting: Internal Medicine

## 2014-07-10 ENCOUNTER — Encounter: Payer: Self-pay | Admitting: Internal Medicine

## 2014-07-10 VITALS — BP 122/70 | HR 79 | Ht 63.0 in | Wt 190.6 lb

## 2014-07-10 DIAGNOSIS — K2100 Gastro-esophageal reflux disease with esophagitis, without bleeding: Secondary | ICD-10-CM

## 2014-07-10 DIAGNOSIS — J302 Other seasonal allergic rhinitis: Secondary | ICD-10-CM

## 2014-07-10 DIAGNOSIS — J309 Allergic rhinitis, unspecified: Secondary | ICD-10-CM

## 2014-07-10 DIAGNOSIS — J3089 Other allergic rhinitis: Principal | ICD-10-CM

## 2014-07-10 MED ORDER — AZELASTINE-FLUTICASONE 137-50 MCG/ACT NA SUSP: NASAL | Status: DC

## 2014-07-10 NOTE — Patient Instructions (Addendum)
Sample and script Dymista nasal spray  1-2 puffs each nostril once daily at bedtime   Please call as needed

## 2014-07-10 NOTE — Progress Notes (Signed)
07/11/12- 66yoF never smoker followed for allergic rhinitis  FOLLOWS FOR: last seen 02-2010; pt states she is having trouble with breathing and cough; was around old house and raking leaves. Started Mucinex to help to bring up mucus. Also, states she is loosing her voice. Drainage in nasal area is green. Starting 10 days ago when she rates leaves and cleaned an old house that was mold in dusty. Green from nose, white from chest. Had some frontal headache initially and some wheeze with cough. Husband had had a cold. She took Mucinex, old cough syrup and cough drops.  07/12/13- 67 yoF never smoker followed for allergic rhinitis, complicated by RA, HBP, GERD  FOLLOWS FOR: Pt states she is doing very well. Pt notices intermittent cough with and without mucus. Denies SOB and CP. Reports a good year with no special problems since last here. Trying to wean off Prilosec so we discussed interaction of reflux with respiratory complaints  07/10/14-  67 yoF never smoker followed for allergic rhinitis, complicated by RA, HBP, GERD  FOLLOWS FOR: Pt state since going to the mtns that she can breathe better; felt drainage coming back since being home now.  She stated week at [facility] before tree pollen since started and felt much better. Has noted nasal congestion on returning to [place]. Using ranitidine 75 mg once daily. Postnasal drainage increases cough which seems to increase her reflux.  ROS-see HPI Constitutional:   No-   weight loss, night sweats, fevers, chills, fatigue, lassitude. HEENT:   + headaches, difficulty swallowing, tooth/dental problems, sore throat,       No-  sneezing, itching, ear ache, +nasal congestion, post nasal drip,  CV:  No-   chest pain, orthopnea, PND, swelling in lower extremities, anasarca,                                                         dizziness, palpitations Resp: No-   shortness of breath with exertion or at rest.              No-productive cough,  +  non-productive cough,  No- coughing up of blood.              No-   change in color of mucus.  No-wheezing.   Skin: No-   rash or lesions. GI:  No-   heartburn, indigestion, abdominal pain, nausea, vomiting, GU:  MS:  No-   joint pain or swelling.   Neuro-     nothing unusual Psych:  No- change in mood or affect. No depression or anxiety.  No memory loss.  OBJ- Physical Exam General- Alert, Oriented, Affect-appropriate, Distress- none acute Skin- rash-none, lesions- none, excoriation- none Lymphadenopathy- none Head- atraumatic            Eyes- Gross vision intact, PERRLA, conjunctivae and secretions clear            Ears- Hearing, canals-normal            Nose- + turbinates pale and swollen, no-Septal dev, mucus, polyps, erosion, perforation             Throat- Mallampati II , mucosa clear , drainage- none, tonsils- atrophic.  Neck- flexible , trachea midline, no stridor , thyroid nl, carotid no bruit Chest - symmetrical excursion , unlabored  Heart/CV- RRR , no murmur , no gallop  , no rub, nl s1 s2                           - JVD- none , edema- none, stasis changes- none, varices- none           Lung- clear to P&A, wheeze- none, cough- none , dullness-none, rub- none           Chest wall-  Abd-  Br/ Gen/ Rectal- Not done, not indicated Extrem- cyanosis- none, clubbing, none, atrophy- none, strength- nl Neuro- grossly intact to observation

## 2014-07-10 NOTE — Assessment & Plan Note (Signed)
Seasonal exacerbation Plan-sample Dymista nasal spray for trial

## 2014-07-10 NOTE — Assessment & Plan Note (Signed)
Postnasal drainage  seems related to cough and to increased GERD symptoms Plan-reflux precautions, increase Zantac to 150 mg twice daily before meals

## 2014-08-26 ENCOUNTER — Ambulatory Visit: Payer: Self-pay | Admitting: Internal Medicine

## 2014-09-24 ENCOUNTER — Telehealth: Payer: Self-pay | Admitting: Internal Medicine

## 2014-09-24 MED ORDER — BENZONATATE 100 MG PO CAPS: ORAL_CAPSULE | ORAL | Status: DC

## 2014-09-24 NOTE — Telephone Encounter (Signed)
Spoke with pt and informed her if CY's recs. Tessalon perles called into pharmacy. Nothing further needed.

## 2014-09-24 NOTE — Telephone Encounter (Signed)
This sounds viral, in which case antibiotics won't work and she may have to wait it out with fluids and otc meds for symptoms. Offer benzonatate perles 100 mg, # 30, 1-2 every 8 hours if needed for cough

## 2014-09-24 NOTE — Telephone Encounter (Signed)
Spoke with pt. Reports increased coughing with production of white mucus and SOB. Denies chest tightness, wheezing or fever. All of this started while she was on a cruise. She was given prednisone and an antibiotic while on the cruise. These medications did not help. Would like CY's recommendations.  Allergies  Allergen Reactions  . Neomycin-Bacitracin Zn-Polymyx   . Tetracycline     Current Outpatient Prescriptions on File Prior to Visit  Medication Sig Dispense Refill  . albuterol (PROVENTIL HFA;VENTOLIN HFA) 108 (90 BASE) MCG/ACT inhaler Use 2 puffs every 4-6 hours as needed 1 Inhaler 6  . Azelastine-Fluticasone (DYMISTA) 137-50 MCG/ACT SUSP 1-2 puffs each nostril once daily at bedtime 1 Bottle prn  . calcium gluconate 500 MG tablet Take 1 tablet by mouth 2 (two) times daily.    . Cholecalciferol (VITAMIN D3) 2000 UNITS TABS Take 1 tablet by mouth daily.    . clobetasol cream (TEMOVATE) 0.05 % Apply topically as needed.    . fish oil-omega-3 fatty acids 1000 MG capsule Take 2 capsules by mouth daily. 1 daily    . hydroxychloroquine (PLAQUENIL) 200 MG tablet Take 100 mg by mouth 2 (two) times daily.    . irbesartan (AVAPRO) 150 MG tablet Take 150 mg by mouth daily.      Marland Kitchen LIOTHYRONINE SODIUM PO Take 1 tablet by mouth daily. T4, T3 compounded medication    . Melatonin 1 MG TABS Take 0.05 tablets by mouth at bedtime.    . Misc Natural Products (TART CHERRY ADVANCED PO) Take 1 tablet by mouth 2 (two) times daily.     . NON FORMULARY Progesterone compound 25mg  take 1 tablet daily    . promethazine-codeine (PHENERGAN WITH CODEINE) 6.25-10 MG/5ML syrup Take 5 mLs by mouth every 6 (six) hours as needed for cough. 180 mL 0  . ranitidine (ZANTAC) 75 MG tablet Take 75 mg by mouth daily.     No current facility-administered medications on file prior to visit.    CY - please advise. Thanks.

## 2014-10-24 ENCOUNTER — Telehealth: Payer: Self-pay | Admitting: Internal Medicine

## 2014-10-24 NOTE — Telephone Encounter (Signed)
Patient notified.  Nothing further needed. 

## 2014-10-24 NOTE — Telephone Encounter (Signed)
Spoke with pt. States that she went to see her OB-GYN and she was told that she had some wheezing in her L lung that sounded like bronchitis. Pt reports that she doesn't feel like she is wheezing. Reports coughing with production of white mucus. Denies chest tightness, SOB or fever. Would like to know what CY thinks needs to be done for the wheezing.  CY - please advise. Thanks.

## 2014-10-24 NOTE — Telephone Encounter (Signed)
Just use her albuterol rescue inhaler, 2 puffs every 4-6 hours IF NEEDED. If it doesn't make her feel better,and she feels well, then she may be fine to just watch.

## 2015-02-26 ENCOUNTER — Telehealth: Payer: Self-pay | Admitting: Internal Medicine

## 2015-02-26 MED ORDER — PREDNISONE 10 MG PO TABS: ORAL_TABLET | ORAL | Status: DC

## 2015-02-26 NOTE — Telephone Encounter (Signed)
Spoke with pt. She c/o prod cough (white phlem), wheezing, increase SOB w/ exertion, chest tx x 1 week.  She has been taking mucinex and using her albuterol inhaler q4hrs. She takes OTC nighttime nyquil. Please advise Dr. Annamaria Boots thanks  Allergies  Allergen Reactions  . Neomycin-Bacitracin Zn-Polymyx   . Tetracycline      Current Outpatient Prescriptions on File Prior to Visit  Medication Sig Dispense Refill  . albuterol (PROVENTIL HFA;VENTOLIN HFA) 108 (90 BASE) MCG/ACT inhaler Use 2 puffs every 4-6 hours as needed 1 Inhaler 6  . Azelastine-Fluticasone (DYMISTA) 137-50 MCG/ACT SUSP 1-2 puffs each nostril once daily at bedtime 1 Bottle prn  . calcium gluconate 500 MG tablet Take 1 tablet by mouth 2 (two) times daily.    . Cholecalciferol (VITAMIN D3) 2000 UNITS TABS Take 1 tablet by mouth daily.    . clobetasol cream (TEMOVATE) 0.05 % Apply topically as needed.    . fish oil-omega-3 fatty acids 1000 MG capsule Take 2 capsules by mouth daily. 1 daily    . hydroxychloroquine (PLAQUENIL) 200 MG tablet Take 100 mg by mouth 2 (two) times daily.    . irbesartan (AVAPRO) 150 MG tablet Take 150 mg by mouth daily.      Marland Kitchen LIOTHYRONINE SODIUM PO Take 1 tablet by mouth daily. T4, T3 compounded medication    . Melatonin 1 MG TABS Take 0.05 tablets by mouth at bedtime.    . Misc Natural Products (TART CHERRY ADVANCED PO) Take 1 tablet by mouth 2 (two) times daily.     . NON FORMULARY Progesterone compound 25mg  take 1 tablet daily    . ranitidine (ZANTAC) 75 MG tablet Take 75 mg by mouth daily.     No current facility-administered medications on file prior to visit.

## 2015-02-26 NOTE — Telephone Encounter (Signed)
Sounds viral. Antibiotic won't help.  Best for the wheeze and tightness will be prednisone 10 mg, # 20 4 X 2 DAYS, 3 X 2 DAYS, 2 X 2 DAYS, 1 X 2 DAYS

## 2015-02-26 NOTE — Telephone Encounter (Signed)
Called spoke with pt regarding recs. RX has been called in. Nothing further needed

## 2015-04-22 ENCOUNTER — Telehealth: Payer: Self-pay | Admitting: Internal Medicine

## 2015-04-22 MED ORDER — PREDNISONE 10 MG PO TABS: ORAL_TABLET | ORAL | Status: DC

## 2015-04-22 NOTE — Telephone Encounter (Signed)
Called spoke w/ pt. Aware of recs. RX sent in. Nothing further needed

## 2015-04-22 NOTE — Telephone Encounter (Signed)
Called spoke with pt. She is requesting prednisone to be called in. C/p prod cough (white phlem), blowing out yellow phlem, wheezing, chest tx x couple days. Please advise Dr. Annamaria Boots thanks  Allergies  Allergen Reactions  . Neomycin-Bacitracin Zn-Polymyx   . Tetracycline      Current Outpatient Prescriptions on File Prior to Visit  Medication Sig Dispense Refill  . albuterol (PROVENTIL HFA;VENTOLIN HFA) 108 (90 BASE) MCG/ACT inhaler Use 2 puffs every 4-6 hours as needed 1 Inhaler 6  . Azelastine-Fluticasone (DYMISTA) 137-50 MCG/ACT SUSP 1-2 puffs each nostril once daily at bedtime 1 Bottle prn  . calcium gluconate 500 MG tablet Take 1 tablet by mouth 2 (two) times daily.    . Cholecalciferol (VITAMIN D3) 2000 UNITS TABS Take 1 tablet by mouth daily.    . clobetasol cream (TEMOVATE) 0.05 % Apply topically as needed.    . fish oil-omega-3 fatty acids 1000 MG capsule Take 2 capsules by mouth daily. 1 daily    . hydroxychloroquine (PLAQUENIL) 200 MG tablet Take 100 mg by mouth 2 (two) times daily.    . irbesartan (AVAPRO) 150 MG tablet Take 150 mg by mouth daily.      Marland Kitchen LIOTHYRONINE SODIUM PO Take 1 tablet by mouth daily. T4, T3 compounded medication    . Melatonin 1 MG TABS Take 0.05 tablets by mouth at bedtime.    . Misc Natural Products (TART CHERRY ADVANCED PO) Take 1 tablet by mouth 2 (two) times daily.     . NON FORMULARY Progesterone compound 25mg  take 1 tablet daily    . predniSONE (DELTASONE) 10 MG tablet Take 4 tabs daily x 2 days, 3 tabs daily x 2 days, 2 tabs daily x 2 days, 1 tab daily x 2 days then stop 20 tablet 0  . ranitidine (ZANTAC) 75 MG tablet Take 75 mg by mouth daily.     No current facility-administered medications on file prior to visit.

## 2015-04-22 NOTE — Telephone Encounter (Signed)
Offer prednisone 110 mg, # 20, 4 X 2 DAYS, 3 X 2 DAYS, 2 X 2 DAYS, 1 X 2 DAYS

## 2015-06-06 ENCOUNTER — Encounter: Payer: Self-pay | Admitting: Gastroenterology

## 2015-07-10 ENCOUNTER — Ambulatory Visit: Payer: Self-pay | Admitting: Internal Medicine

## 2015-09-16 ENCOUNTER — Ambulatory Visit (HOSPITAL_COMMUNITY)
Admission: RE | Admit: 2015-09-16 | Discharge: 2015-09-16 | Disposition: A | Discharge status: Home / Self Care | Payer: Medicare Other | Source: Ambulatory Visit | Attending: Rheumatology | Admitting: Rheumatology

## 2015-09-16 ENCOUNTER — Other Ambulatory Visit (HOSPITAL_COMMUNITY): Payer: Self-pay | Admitting: Rheumatology

## 2015-09-16 DIAGNOSIS — M069 Rheumatoid arthritis, unspecified: Secondary | ICD-10-CM

## 2016-01-12 ENCOUNTER — Ambulatory Visit: Payer: Medicare Other | Admitting: Gastroenterology

## 2016-01-20 ENCOUNTER — Ambulatory Visit (INDEPENDENT_AMBULATORY_CARE_PROVIDER_SITE_OTHER): Payer: Medicare Other | Admitting: Gastroenterology

## 2016-01-20 ENCOUNTER — Encounter: Payer: Self-pay | Admitting: Gastroenterology

## 2016-01-20 VITALS — BP 156/90 | HR 84 | Ht 62.21 in | Wt 186.1 lb

## 2016-01-20 DIAGNOSIS — R1110 Vomiting, unspecified: Secondary | ICD-10-CM

## 2016-01-20 DIAGNOSIS — R112 Nausea with vomiting, unspecified: Secondary | ICD-10-CM

## 2016-01-20 DIAGNOSIS — K219 Gastro-esophageal reflux disease without esophagitis: Secondary | ICD-10-CM | POA: Diagnosis not present

## 2016-01-20 DIAGNOSIS — IMO0001 Reserved for inherently not codable concepts without codable children: Secondary | ICD-10-CM

## 2016-01-20 NOTE — Progress Notes (Signed)
Sally Diaz    NO:[id number]    [birth year]  Primary Care Physician:BOUSKA,DAVID E, MD  Referring Physician: Bernerd Limbo, MD [address] [place] [address] [place], [place] [address]  Chief complaint:  Vomiting, GERD  HPI: 74 yr F previously followed by Dr Olevia Perches for GERD, last seen in April 2014 here with c/o intermittent vomiting for past 6 months. She has been having about 1 or 2 episodes of vomiting per month. Before vomiting she feels something is stuck middle of her chest that improves after vomiting. She also has post nasal drip, and h/o intermittent bronchitis. No dysphagia or odynophagia. EGD in March 2007 showed mild esophagitis, biopsies negative for Barrett's esophagus. Colonoscopy in March 2007 was unremarkable. Colon cancer in distant relative (Paternal Cousin) . She has lost ~5 lbs in past 6 months. GERD symptoms currently controlled with Zantac    Outpatient Encounter Prescriptions as of 01/20/2016  Medication Sig  . albuterol (PROVENTIL HFA;VENTOLIN HFA) 108 (90 BASE) MCG/ACT inhaler Use 2 puffs every 4-6 hours as needed  . calcium gluconate 500 MG tablet Take 1 tablet by mouth 2 (two) times daily.  . Cholecalciferol (VITAMIN D3) 5000 units TBDP Take 1 tablet by mouth daily.  . fish oil-omega-3 fatty acids 1000 MG capsule Take 2 capsules by mouth daily. 1 daily  . hydroxychloroquine (PLAQUENIL) 200 MG tablet Take 100 mg by mouth 2 (two) times daily at 10 AM and 5 PM. Takes 1/2 q a.m. and 1 in the p.m.  . irbesartan (AVAPRO) 300 MG tablet Take 150 mg by mouth daily.    Marland Kitchen LIOTHYRONINE SODIUM PO Take 1 tablet by mouth daily. T4, T3 compounded medication  . Misc Natural Products (TART CHERRY ADVANCED PO) Take 1 tablet by mouth 2 (two) times daily.   . NON FORMULARY Progesterone compound 25mg  take 1 tablet daily  . Nutritional Supplements (DHEA) 15-50 MG CAPS Take 15 mg by mouth daily.  . Pregnenolone POWD Take 2 mg by mouth daily.  .  [DISCONTINUED] Azelastine-Fluticasone (DYMISTA) 137-50 MCG/ACT SUSP 1-2 puffs each nostril once daily at bedtime  . [DISCONTINUED] clobetasol cream (TEMOVATE) 0.05 % Apply topically as needed.  . [DISCONTINUED] Melatonin 1 MG TABS Take 0.05 tablets by mouth at bedtime.  . [DISCONTINUED] predniSONE (DELTASONE) 10 MG tablet Take 4 tabs daily x 2 days, 3 tabs daily x 2 days, 2 tabs daily x 2 days, 1 tab daily x 2 days then stop  . [DISCONTINUED] ranitidine (ZANTAC) 75 MG tablet Take 75 mg by mouth daily.   No facility-administered encounter medications on file as of 01/20/2016.     Allergies as of 01/20/2016 - Review Complete 01/20/2016  Allergen Reaction Noted  . Neomycin-bacitracin zn-polymyx    . Tetracycline      Past Medical History:  Diagnosis Date  . Allergy, unspecified not elsewhere classified   . Anemia, iron deficiency   . Basal cell carcinoma   . Diverticulosis of colon   . Hx of adenomatous colonic polyps   . Hypercholesterolemia   . Hypertension   . Hypothyroidism   . Obesity   . Reflux esophagitis     Past Surgical History:  Procedure Laterality Date  . CATARACT EXTRACTION, BILATERAL    . CERVICAL CONE BIOPSY    . TONSILLECTOMY      Family History  Problem Relation Age of Onset  . Colon cancer Cousin     Maternal Side  .  Diabetes Maternal Grandmother   . Heart disease Mother   . Bladder Cancer Mother   . Stroke Father   . Pneumonia Father   . Lung cancer Brother     Social History   Social History  . Marital status: Married    Spouse name: N/A  . Number of children: N/A  . Years of education: N/A   Occupational History  . Retired    Social History Main Topics  . Smoking status: Never Smoker  . Smokeless tobacco: Never Used  . Alcohol use Yes     Comment: occ   . Drug use: No  . Sexual activity: Not on file   Other Topics Concern  . Not on file   Social History Narrative  . No narrative on file      Review of systems: Review of  Systems  Constitutional: Negative for fever and chills.  HENT: Positive for post nasal drip Eyes: Negative for blurred vision.  Respiratory: Negative for cough, shortness of breath and wheezing.   Cardiovascular: Negative for chest pain and palpitations.  Gastrointestinal: as per HPI Genitourinary: Negative for dysuria, urgency, frequency and hematuria.  Musculoskeletal: Positive for myalgias, back pain and joint pain.  Skin: Negative for itching and rash.  Neurological: Negative for dizziness, tremors, focal weakness, seizures and loss of consciousness.  Endo/Heme/Allergies: Positive for seasonal allergies.  Psychiatric/Behavioral: Negative for depression, suicidal ideas and hallucinations.  All other systems reviewed and are negative.   Physical Exam: Vitals:   01/20/16 1355  BP: (!) 156/90  Pulse: 84   Body mass index is 33.82 kg/m. Gen:      No acute distress HEENT:  EOMI, sclera anicteric Neck:     No masses; no thyromegaly Lungs:    Clear to auscultation bilaterally; normal respiratory effort CV:         Regular rate and rhythm; no murmurs Abd:      + bowel sounds; soft, non-tender; no palpable masses, no distension Ext:    No edema; adequate peripheral perfusion Skin:      Warm and dry; no rash Neuro: alert and oriented x 3 Psych: normal mood and affect  Data Reviewed:  Reviewed labs, radiology imaging, old records and pertinent past GI work up   Assessment and Plan/Recommendations: 23 yr F with h/o rheumatoid arthritis on Plaquenil and Methotrexate, chronic GERD here with c/o nausea and intermittent episodes of intractable vomiting for past 6 months Patient's symptoms are atypical to have peptic ulcer disease or gastric outlet obstruction Will obtain gastric emptying study to exclude gastroparesis Also obtain upper GI series to evaluate for possible dysmotility Small frequent meals, avoid high fiber and high fat diet.  Due for screening colonoscopy but will wait  to schedule until after resolution of upper GI symptoms. Return in 4-6 weeks or sooner if needed Greater than 50% of the time used for counseling as well as treatment plan and follow-up. She had multiple questions which were answered to her satisfaction  K. Denzil Magnuson , MD [phone] Mon-Fri 8a-5p [phone] after 5p, weekends, holidays  CC: Bernerd Limbo, MD

## 2016-01-20 NOTE — Patient Instructions (Signed)
You have been scheduled for a gastric emptying scan at [facility] on 02/04/2016 at 7:30am. Please arrive at least 15 minutes prior to your appointment for registration. Please make certain not to have anything to eat or drink after midnight the night before your test. Hold all stomach medications (ex: Zofran, phenergan, Reglan) 48 hours prior to your test. If you need to reschedule your appointment, please contact radiology scheduling at [phone]. _____________________________________________________________________ A gastric-emptying study measures how long it takes for food to move through your stomach. There are several ways to measure stomach emptying. In the most common test, you eat food that contains a small amount of radioactive material. A scanner that detects the movement of the radioactive material is placed over your abdomen to monitor the rate at which food leaves your stomach. This test normally takes about 4 hours to complete. _____________________________________________________________________        Sally Diaz have been scheduled for an Upper GI Series at 02/09/2016. Your appointment is on 8:30am at [facility]. Please arrive 15 minutes prior to your test for registration. Make sure not to eat or drink anything after midnight on the night before your test. If you need to reschedule, please call radiology at [phone]. ________________________________________________________________ An upper GI series uses x rays to help diagnose problems of the upper GI tract, which includes the esophagus, stomach, and duodenum. The duodenum is the first part of the small intestine. An upper GI series is conducted by a radiology technologist or a radiologist-a doctor who specializes in x-ray imaging-at a hospital or outpatient center. While sitting or standing in front of an x-ray machine, the patient drinks barium liquid, which is often white and has a chalky consistency and taste. The barium  liquid coats the lining of the upper GI tract and makes signs of disease show up more clearly on x rays. X-ray video, called fluoroscopy, is used to view the barium liquid moving through the esophagus, stomach, and duodenum. Additional x rays and fluoroscopy are performed while the patient lies on an x-ray table. To fully coat the upper GI tract with barium liquid, the technologist or radiologist may press on the abdomen or ask the patient to change position. Patients hold still in various positions, allowing the technologist or radiologist to take x rays of the upper GI tract at different angles. If a technologist conducts the upper GI series, a radiologist will later examine the images to look for problems.  This test typically takes about 1 hour to complete. __________________________________________________________________

## 2016-02-03 ENCOUNTER — Ambulatory Visit (INDEPENDENT_AMBULATORY_CARE_PROVIDER_SITE_OTHER): Payer: Medicare Other | Admitting: Rheumatology

## 2016-02-03 ENCOUNTER — Encounter: Payer: Self-pay | Admitting: Rheumatology

## 2016-02-03 VITALS — BP 163/84 | HR 83 | Resp 14 | Ht 63.0 in | Wt 188.0 lb

## 2016-02-03 DIAGNOSIS — M79642 Pain in left hand: Secondary | ICD-10-CM

## 2016-02-03 DIAGNOSIS — M170 Bilateral primary osteoarthritis of knee: Secondary | ICD-10-CM | POA: Diagnosis not present

## 2016-02-03 DIAGNOSIS — M25532 Pain in left wrist: Secondary | ICD-10-CM | POA: Diagnosis not present

## 2016-02-03 DIAGNOSIS — M0579 Rheumatoid arthritis with rheumatoid factor of multiple sites without organ or systems involvement: Secondary | ICD-10-CM

## 2016-02-03 DIAGNOSIS — M25531 Pain in right wrist: Secondary | ICD-10-CM | POA: Diagnosis not present

## 2016-02-03 DIAGNOSIS — M79641 Pain in right hand: Secondary | ICD-10-CM | POA: Diagnosis not present

## 2016-02-03 DIAGNOSIS — Z79899 Other long term (current) drug therapy: Secondary | ICD-10-CM

## 2016-02-03 LAB — CBC WITH DIFFERENTIAL/PLATELET
Basophils Absolute: 44 cells/uL (ref 0–200)
Basophils Relative: 1 %
EOS PCT: 6 %
Eosinophils Absolute: 264 cells/uL (ref 15–500)
HCT: 35.9 % (ref 35.0–45.0)
HEMOGLOBIN: 11.8 g/dL (ref 11.7–15.5)
LYMPHS ABS: 1188 {cells}/uL (ref 850–3900)
Lymphocytes Relative: 27 %
MCH: 29.9 pg (ref 27.0–33.0)
MCHC: 32.9 g/dL (ref 32.0–36.0)
MCV: 91.1 fL (ref 80.0–100.0)
MPV: 8.9 fL (ref 7.5–12.5)
Monocytes Absolute: 528 cells/uL (ref 200–950)
Monocytes Relative: 12 %
NEUTROS ABS: 2376 {cells}/uL (ref 1500–7800)
NEUTROS PCT: 54 %
Platelets: 268 10*3/uL (ref 140–400)
RBC: 3.94 MIL/uL (ref 3.80–5.10)
RDW: 13.6 % (ref 11.0–15.0)
WBC: 4.4 10*3/uL (ref 3.8–10.8)

## 2016-02-03 LAB — COMPLETE METABOLIC PANEL WITH GFR
ALBUMIN: 4.4 g/dL (ref 3.6–5.1)
ALT: 14 U/L (ref 6–29)
AST: 18 U/L (ref 10–35)
Alkaline Phosphatase: 65 U/L (ref 33–130)
BUN: 11 mg/dL (ref 7–25)
CHLORIDE: 106 mmol/L (ref 98–110)
CO2: 26 mmol/L (ref 20–31)
Calcium: 9.6 mg/dL (ref 8.6–10.4)
Creat: 0.97 mg/dL — ABNORMAL HIGH (ref 0.60–0.93)
GFR, Est African American: 68 mL/min (ref >=60)
GFR, Est Non African American: 59 mL/min — ABNORMAL LOW (ref >=60)
GLUCOSE: 91 mg/dL (ref 65–99)
POTASSIUM: 4.3 mmol/L (ref 3.5–5.3)
SODIUM: 140 mmol/L (ref 135–146)
Total Bilirubin: 0.6 mg/dL (ref 0.2–1.2)
Total Protein: 7.1 g/dL (ref 6.1–8.1)

## 2016-02-03 MED ORDER — METHOTREXATE 2.5 MG PO TABS: 15.0000 mg | ORAL_TABLET | ORAL | 0 refills | Status: DC

## 2016-02-03 MED ORDER — FOLIC ACID 1 MG PO TABS: 2.0000 mg | ORAL_TABLET | Freq: Every day | ORAL | 4 refills | Status: AC

## 2016-02-03 MED ORDER — HYDROXYCHLOROQUINE SULFATE 200 MG PO TABS: 100.0000 mg | ORAL_TABLET | Freq: Two times a day (BID) | ORAL | 1 refills | Status: AC

## 2016-02-03 NOTE — Progress Notes (Signed)
Office Visit Note  Patient: Sally Diaz             Date of Birth: 09-16-1945           MRN: [id number]             PCP: Phineas Inches, MD Referring: Bernerd Limbo, MD Visit Date: 02/03/2016 Occupation: @GUAROCC @    Subjective:  Follow-up Follow-up on rheumatoid arthritis and high risk prescription  History of Present Illness: Sally Diaz is a 70 y.o. female since Plaquenil was not effective, Dr. Estanislado Pandy added methotrexate. She was requested to return back in 3 months.  On last visit on 09/16/2015, the patient was started on methotrexate by Dr. Estanislado Pandy. The plan was for the patient to get a TB gold chest x-ray pneumococcal vaccine and then after all of these things are done she can start methotrexate at 4 pills every week for 2 weeks then 6 pills every week for 2 weeks. She was also advised to take folic acid.  The above was done because the rheumatoid arthritis was causing increased joint pain and swelling and synovitis in hands and wrist joint she was having nocturnal pain. She was on Plaquenil only and had a recent prednisone taper.  Today patient states: That she was unable to take the methotrexate after 1 week because she was having a lead test done and she needed to stop the methotrexate.  Patient states that her flare that she saw Dr. Estanislado Pandy for on June 27 7,17,  all her pain symptoms  resolved. She suspects the right shoulder joint pain was coming from a long ride from here to [place]. She was sleeping in the car in awkward ways that may have aggravated her muscles in her shoulder joint. Overall since the last visit patient is doing great with her right shoulder as well as bilateral hands including her wrists. She is only taking Plaquenil at this time is not on methotrexate at all. She has not had any pain swelling symptoms and she is content with Plaquenil alone and has not used methotrexate except for that first initial dose back in June/July 2017. At this time she  feels that she does not need methotrexate at alshe feels that exercise can benefit her joints area   Activities of Daily Living:  Patient reports morning stiffness for 15 minutes.   Patient Denies nocturnal pain.  Difficulty dressing/grooming: Denies Difficulty climbing stairs: Denies Difficulty getting out of chair: Denies Difficulty using hands for taps, buttons, cutlery, and/or writing: Denies   Review of Systems  Constitutional: Negative for fatigue.  HENT: Negative for mouth sores and mouth dryness.   Eyes: Negative for dryness.  Respiratory: Negative for shortness of breath.   Gastrointestinal: Negative for constipation and diarrhea.  Musculoskeletal: Negative for myalgias and myalgias.  Skin: Negative for sensitivity to sunlight.  Psychiatric/Behavioral: Negative for decreased concentration and sleep disturbance.    PMFS History:  Patient Active Problem List   Diagnosis Date Noted  . Acute bronchitis 07/17/2012  . RHEUMATOID ARTHRITIS 11/11/2008  . CARCINOMA, BASAL CELL 06/23/2007  . HYPOTHYROIDISM 06/23/2007  . HYPERCHOLESTEROLEMIA 06/23/2007  . HYPERTENSION 06/23/2007  . Seasonal and perennial allergic rhinitis 06/23/2007  . OBESITY, UNSPECIFIED 05/04/2007  . ESOPHAGITIS, REFLUX 06/04/2005  . DIVERTICULOSIS, COLON 06/04/2005    Past Medical History:  Diagnosis Date  . Allergy, unspecified not elsewhere classified   . Anemia, iron deficiency   . Basal cell carcinoma   . Diverticulosis of colon   . Hx  of adenomatous colonic polyps   . Hypercholesterolemia   . Hypertension   . Hypothyroidism   . Obesity   . Reflux esophagitis     Family History  Problem Relation Age of Onset  . Colon cancer Cousin     Maternal Side  . Diabetes Maternal Grandmother   . Heart disease Mother   . Bladder Cancer Mother   . Stroke Father   . Pneumonia Father   . Lung cancer Brother    Past Surgical History:  Procedure Laterality Date  . CATARACT EXTRACTION, BILATERAL      . CERVICAL CONE BIOPSY    . TONSILLECTOMY     Social History   Social History Narrative  . No narrative on file     Objective: Vital Signs: BP (!) 163/84 (BP Location: Left Arm, Patient Position: Sitting, Cuff Size: Large)   Pulse 83   Resp 14   Ht 5\' 3"  (1.6 m)   Wt 188 lb (85.3 kg)   BMI 33.30 kg/m    Physical Exam  Constitutional: She is oriented to person, place, and time. She appears well-developed and well-nourished.  HENT:  Head: Normocephalic and atraumatic.  Eyes: EOM are normal. Pupils are equal, round, and reactive to light.  Cardiovascular: Normal rate, regular rhythm and normal heart sounds.  Exam reveals no gallop and no friction rub.   No murmur heard. Pulmonary/Chest: Effort normal and breath sounds normal. She has no wheezes. She has no rales.  Abdominal: Soft. Bowel sounds are normal. She exhibits no distension. There is no tenderness. There is no guarding. No hernia.  Musculoskeletal: Normal range of motion. She exhibits no edema, tenderness or deformity.  Lymphadenopathy:    She has no cervical adenopathy.  Neurological: She is alert and oriented to person, place, and time. Coordination normal.  Skin: Skin is warm and dry. Capillary refill takes less than 2 seconds. No rash noted.  Psychiatric: She has a normal mood and affect. Her behavior is normal.  Nursing note and vitals reviewed.    Musculoskeletal Exam: Full range of motion of all joints except decreased grip strength in the left hand. She is only able to make about 80% fist formation. For myalgia tender points are all absent  CDAI Exam: CDAI Homunculus Exam:   Tenderness:  RUE: wrist Right hand: 2nd MCP and 3rd MCP Left hand: 4th MCP  Joint Counts:  CDAI Tender Joint count: 4 CDAI Swollen Joint count: 0  Global Assessments:  Patient Global Assessment: 6 Provider Global Assessment: 6  CDAI Calculated Score: 16  Patient does have synovitis to right second MCP joint and ulnar  styloid of the right wrist. Left fourth MCP joint.  Investigation: No additional findings. Labs from 10/02/2015 shows CMP with GFR normal CBC with differential normal.  Imaging: No results found. Patient did go and get the chest x-ray done after 09/16/2015 visit Patient's TB goal is negative as of 09/16/2015 Patient's SPEP is negative also. Patient's immunoglobulin panel is negative for immunoglobulin G and A  And  Immunoglobulin M is slightly low at 22 and we will monitor.  Speciality Comments: No specialty comments available.    Procedures:  No procedures performed Allergies: Neomycin-bacitracin zn-polymyx and Tetracycline   Assessment / Plan: Visit Diagnoses: Rheumatoid arthritis with rheumatoid factor of multiple sites without organ or systems involvement ([place]) - +RF; +CCP - Plan: CBC with Differential/Platelet, COMPLETE METABOLIC PANEL WITH GFR, VITAMIN D 25 Hydroxy (Vit-D Deficiency, Fractures)  High risk medications (not anticoagulants) long-term use -  Methotrexate 6 per week,; folic acid 1 mg per day; Plaquenil 200 mg in the morning and 100 mg in the evening - Plan: CBC with Differential/Platelet, COMPLETE METABOLIC PANEL WITH GFR, VITAMIN D 25 Hydroxy (Vit-D Deficiency, Fractures)  Osteoarthritis of both knees, unspecified osteoarthritis type  Bilateral hand pain  Bilateral wrist pain   Refill methotrexate 6 per week ninety-day supply with no refills She will start with 4 pills of methotrexate every week for 2 weeks Then do labs on Wednesday. Then she will move up to 6 pills every week for 2 weeks Then do labs on Wednesday Then continue methotrexate 6 pills every week and do labs 2 months later.  She will take folic acid 2 mg every day.  I discussed the risks and benefits of the disease process as well as a treatment option with the patient at length. She herself is agreeable on starting methotrexate at this time.  Refill folic acid 2 mg every day dispense 90 day  supply with 4 refills  CBC with differential CMP with GFR every 2 months  Refill Plaquenil 200 mg twice a day Monday through Friday, ninety-day supply with one refill  Patient did get get chest x-ray, pneumococcal vaccine, TB gold and other labs  She will return to clinic in 3 months so we can see how she is responding to her methotrexate. If she is doing well at plan to stop her off of the Plaquenil. 3 months after that visit ID 6 months from today, maybe we can decrease the methotrexate to 5 per week or even 4 per week depending on how well the patient is doing. She also does not want to be a "pincushion". As a result I'm hopeful that if her labs are doing well we can do CBC with differential and CMP with GFR every 3 months. Next return to clinic in 3 months.  Orders: Orders Placed This Encounter  Procedures  . CBC with Differential/Platelet  . COMPLETE METABOLIC PANEL WITH GFR  . VITAMIN D 25 Hydroxy (Vit-D Deficiency, Fractures)   Meds ordered this encounter  Medications  . hydroxychloroquine (PLAQUENIL) 200 MG tablet    Sig: Take 0.5 tablets (100 mg total) by mouth 2 (two) times daily at 10 AM and 5 PM. Takes 1 tablet q a.m. and 1/2 tablet po in the p.m.    Dispense:  135 tablet    Refill:  1    Order Specific Question:   Supervising Provider    Answer:   Bo Merino [[id number]]  . methotrexate (RHEUMATREX) 2.5 MG tablet    Sig: Take 6 tablets (15 mg total) by mouth once a week. Caution:Chemotherapy. Protect from light.    Dispense:  48 tablet    Refill:  0    Order Specific Question:   Supervising Provider    Answer:   Bo Merino [id number]  . folic acid (FOLVITE) 1 MG tablet    Sig: Take 2 tablets (2 mg total) by mouth daily.    Dispense:  180 tablet    Refill:  4    Order Specific Question:   Supervising Provider    Answer:   Bo Merino [phone]    Face-to-face time spent with patient was 30 minutes. 50% of time was spent in counseling and coordination of  care.  Follow-Up Instructions: Return in about 3 months (around 05/05/2016).   Eliezer Lofts, PA-C I examined and evaluated the patient with Eliezer Lofts PA. The plan of care was discussed as noted above.  Bo Merino, MD

## 2016-02-04 ENCOUNTER — Encounter (HOSPITAL_COMMUNITY)
Admission: RE | Admit: 2016-02-04 | Discharge: 2016-02-04 | Disposition: A | Discharge status: Home / Self Care | Payer: Medicare Other | Source: Ambulatory Visit | Attending: Gastroenterology | Admitting: Gastroenterology

## 2016-02-04 DIAGNOSIS — K219 Gastro-esophageal reflux disease without esophagitis: Secondary | ICD-10-CM | POA: Diagnosis present

## 2016-02-04 DIAGNOSIS — R1110 Vomiting, unspecified: Secondary | ICD-10-CM | POA: Diagnosis present

## 2016-02-04 DIAGNOSIS — R112 Nausea with vomiting, unspecified: Secondary | ICD-10-CM | POA: Diagnosis not present

## 2016-02-04 DIAGNOSIS — IMO0001 Reserved for inherently not codable concepts without codable children: Secondary | ICD-10-CM

## 2016-02-04 LAB — VITAMIN D 25 HYDROXY (VIT D DEFICIENCY, FRACTURES): Vit D, 25-Hydroxy: 47 ng/mL (ref 30–100)

## 2016-02-04 MED ORDER — TECHNETIUM TC 99M SULFUR COLLOID
2.0000 | Freq: Once | INTRAVENOUS | Status: AC | PRN
  Administered 2016-02-04: 2 via INTRAVENOUS

## 2016-02-04 NOTE — Progress Notes (Signed)
#  1 vitamin D is normal  #2 CMP with GFR is normal except for slight elevation of creatinine and slight decrease in GFR.We can monitor. No change in treatment.#3 CBC with differential is normal.Please notify patient and sent labs to [facility] you

## 2016-02-05 ENCOUNTER — Telehealth: Payer: Self-pay | Admitting: Radiology

## 2016-02-05 ENCOUNTER — Telehealth: Payer: Self-pay | Admitting: Rheumatology

## 2016-02-05 NOTE — Telephone Encounter (Signed)
-----   Message from [place], [place] sent at 02/04/2016  3:04 PM EST ----- #1 vitamin D is normal  #2 CMP with GFR is normal except for slight elevation of creatinine and slight decrease in GFR.We can monitor. No change in treatment.#3 CBC with differential is normal.Please notify patient and sent labs to [facility] you

## 2016-02-05 NOTE — Telephone Encounter (Signed)
Mailed to patient

## 2016-02-05 NOTE — Telephone Encounter (Signed)
Called patient to advise /faxed to Dr Coletta Memos

## 2016-02-05 NOTE — Telephone Encounter (Signed)
Patient returned Amy's call, please call back. She would like a copy of her labs mailed to her.

## 2016-02-09 ENCOUNTER — Ambulatory Visit (HOSPITAL_COMMUNITY)
Admission: RE | Admit: 2016-02-09 | Discharge: 2016-02-09 | Disposition: A | Discharge status: Home / Self Care | Payer: Medicare Other | Source: Ambulatory Visit | Attending: Gastroenterology | Admitting: Gastroenterology

## 2016-02-09 ENCOUNTER — Telehealth: Payer: Self-pay | Admitting: Gastroenterology

## 2016-02-09 DIAGNOSIS — K219 Gastro-esophageal reflux disease without esophagitis: Secondary | ICD-10-CM | POA: Diagnosis not present

## 2016-02-09 DIAGNOSIS — IMO0001 Reserved for inherently not codable concepts without codable children: Secondary | ICD-10-CM

## 2016-02-09 DIAGNOSIS — R112 Nausea with vomiting, unspecified: Secondary | ICD-10-CM | POA: Diagnosis not present

## 2016-02-09 DIAGNOSIS — R1110 Vomiting, unspecified: Secondary | ICD-10-CM

## 2016-02-09 DIAGNOSIS — K449 Diaphragmatic hernia without obstruction or gangrene: Secondary | ICD-10-CM | POA: Diagnosis not present

## 2016-02-10 ENCOUNTER — Other Ambulatory Visit: Payer: Self-pay

## 2016-02-10 MED ORDER — PANTOPRAZOLE SODIUM 40 MG PO TBEC: 40.0000 mg | DELAYED_RELEASE_TABLET | Freq: Two times a day (BID) | ORAL | 3 refills | Status: DC

## 2016-02-11 ENCOUNTER — Telehealth: Payer: Self-pay | Admitting: *Deleted

## 2016-02-11 MED ORDER — RANITIDINE HCL 75 MG PO TABS: 75.0000 mg | ORAL_TABLET | Freq: Two times a day (BID) | ORAL | 3 refills | Status: DC

## 2016-02-11 NOTE — Telephone Encounter (Addendum)
Dr Silverio Decamp, [facility]-  Patient is requesting ranitidine be sent to her pharmacy because there seems to be an interaction with protonix to methotrexate   It seems she was on Zantac 75 mg in her history of meds so I will send this in to her pharmacy for her

## 2016-02-16 NOTE — Telephone Encounter (Signed)
ok 

## 2016-02-18 ENCOUNTER — Other Ambulatory Visit: Payer: Self-pay | Admitting: Radiology

## 2016-02-18 DIAGNOSIS — Z79899 Other long term (current) drug therapy: Secondary | ICD-10-CM

## 2016-02-18 LAB — COMPLETE METABOLIC PANEL WITH GFR
ALBUMIN: 4.2 g/dL (ref 3.6–5.1)
ALK PHOS: 66 U/L (ref 33–130)
ALT: 14 U/L (ref 6–29)
AST: 17 U/L (ref 10–35)
BILIRUBIN TOTAL: 0.4 mg/dL (ref 0.2–1.2)
BUN: 16 mg/dL (ref 7–25)
CO2: 30 mmol/L (ref 20–31)
CREATININE: 1.08 mg/dL — AB (ref 0.60–0.93)
Calcium: 9.6 mg/dL (ref 8.6–10.4)
Chloride: 103 mmol/L (ref 98–110)
GFR, Est African American: 60 mL/min (ref >=60)
GFR, Est Non African American: 52 mL/min — ABNORMAL LOW (ref >=60)
GLUCOSE: 76 mg/dL (ref 65–99)
POTASSIUM: 4.5 mmol/L (ref 3.5–5.3)
SODIUM: 140 mmol/L (ref 135–146)
TOTAL PROTEIN: 7 g/dL (ref 6.1–8.1)

## 2016-02-18 LAB — CBC WITH DIFFERENTIAL/PLATELET
BASOS ABS: 64 {cells}/uL (ref 0–200)
Basophils Relative: 1 %
EOS ABS: 320 {cells}/uL (ref 15–500)
Eosinophils Relative: 5 %
HEMATOCRIT: 35.4 % (ref 35.0–45.0)
Hemoglobin: 11.6 g/dL — ABNORMAL LOW (ref 11.7–15.5)
LYMPHS PCT: 31 %
Lymphs Abs: 1984 cells/uL (ref 850–3900)
MCH: 30.3 pg (ref 27.0–33.0)
MCHC: 32.8 g/dL (ref 32.0–36.0)
MCV: 92.4 fL (ref 80.0–100.0)
MONO ABS: 640 {cells}/uL (ref 200–950)
MPV: 9.2 fL (ref 7.5–12.5)
Monocytes Relative: 10 %
NEUTROS PCT: 53 %
Neutro Abs: 3392 cells/uL (ref 1500–7800)
Platelets: 270 10*3/uL (ref 140–400)
RBC: 3.83 MIL/uL (ref 3.80–5.10)
RDW: 13.6 % (ref 11.0–15.0)
WBC: 6.4 10*3/uL (ref 3.8–10.8)

## 2016-02-19 NOTE — Progress Notes (Signed)
Tell patient:Creatinine slightly elevated at 1.08.2 weeks prior it was slightly better.GFR is low at 52.This is consistent with abnormal kidney function which we can monitor.CBC with differential is normal.No change in treatment at this time.Please see copy of this lab to her PCP

## 2016-03-02 ENCOUNTER — Ambulatory Visit: Payer: Medicare Other | Admitting: Rheumatology

## 2016-03-02 ENCOUNTER — Ambulatory Visit (INDEPENDENT_AMBULATORY_CARE_PROVIDER_SITE_OTHER): Payer: Medicare Other

## 2016-03-02 ENCOUNTER — Ambulatory Visit (INDEPENDENT_AMBULATORY_CARE_PROVIDER_SITE_OTHER): Payer: Medicare Other | Admitting: Rheumatology

## 2016-03-02 ENCOUNTER — Encounter: Payer: Self-pay | Admitting: Rheumatology

## 2016-03-02 ENCOUNTER — Other Ambulatory Visit: Payer: Self-pay | Admitting: *Deleted

## 2016-03-02 VITALS — BP 138/82 | HR 90 | Resp 18 | Ht 62.5 in | Wt 184.0 lb

## 2016-03-02 DIAGNOSIS — M545 Low back pain, unspecified: Secondary | ICD-10-CM

## 2016-03-02 DIAGNOSIS — Z79899 Other long term (current) drug therapy: Secondary | ICD-10-CM

## 2016-03-02 DIAGNOSIS — M5136 Other intervertebral disc degeneration, lumbar region: Secondary | ICD-10-CM

## 2016-03-02 DIAGNOSIS — M19049 Primary osteoarthritis, unspecified hand: Secondary | ICD-10-CM | POA: Insufficient documentation

## 2016-03-02 DIAGNOSIS — M19041 Primary osteoarthritis, right hand: Secondary | ICD-10-CM | POA: Diagnosis not present

## 2016-03-02 DIAGNOSIS — M51369 Other intervertebral disc degeneration, lumbar region without mention of lumbar back pain or lower extremity pain: Secondary | ICD-10-CM

## 2016-03-02 DIAGNOSIS — M179 Osteoarthritis of knee, unspecified: Secondary | ICD-10-CM | POA: Insufficient documentation

## 2016-03-02 DIAGNOSIS — M0579 Rheumatoid arthritis with rheumatoid factor of multiple sites without organ or systems involvement: Secondary | ICD-10-CM

## 2016-03-02 DIAGNOSIS — M170 Bilateral primary osteoarthritis of knee: Secondary | ICD-10-CM

## 2016-03-02 DIAGNOSIS — M19042 Primary osteoarthritis, left hand: Secondary | ICD-10-CM

## 2016-03-02 DIAGNOSIS — M1710 Unilateral primary osteoarthritis, unspecified knee: Secondary | ICD-10-CM | POA: Insufficient documentation

## 2016-03-02 LAB — CBC WITH DIFFERENTIAL/PLATELET
BASOS ABS: 44 {cells}/uL (ref 0–200)
Basophils Relative: 1 %
EOS ABS: 264 {cells}/uL (ref 15–500)
Eosinophils Relative: 6 %
HEMATOCRIT: 36.7 % (ref 35.0–45.0)
HEMOGLOBIN: 12.2 g/dL (ref 11.7–15.5)
LYMPHS PCT: 31 %
Lymphs Abs: 1364 cells/uL (ref 850–3900)
MCH: 30 pg (ref 27.0–33.0)
MCHC: 33.2 g/dL (ref 32.0–36.0)
MCV: 90.2 fL (ref 80.0–100.0)
MONO ABS: 396 {cells}/uL (ref 200–950)
MPV: 9 fL (ref 7.5–12.5)
Monocytes Relative: 9 %
NEUTROS PCT: 53 %
Neutro Abs: 2332 cells/uL (ref 1500–7800)
Platelets: 315 10*3/uL (ref 140–400)
RBC: 4.07 MIL/uL (ref 3.80–5.10)
RDW: 13.8 % (ref 11.0–15.0)
WBC: 4.4 10*3/uL (ref 3.8–10.8)

## 2016-03-02 MED ORDER — TIZANIDINE HCL 4 MG PO TABS: ORAL_TABLET | ORAL | 0 refills | Status: DC

## 2016-03-02 MED ORDER — METHYLPREDNISOLONE 4 MG PO TBPK: ORAL_TABLET | ORAL | 0 refills | Status: DC

## 2016-03-02 NOTE — Progress Notes (Signed)
Patient was counseled on the purpose, proper use, and adverse effects of methylprednisolone and tizanidine.  Advised patient to take methylprednisolone with food in order to reduce GI upset.  Advised patient to avoid driving after taking tizanidine due to risk of sedation.  Patient voiced understanding.  Patient denied any questions or concerns regarding her medications.    Elisabeth Most, Pharm.D., BCPS Clinical Pharmacist Pager: [phone] Phone: [phone] 03/02/2016 2:47 PM

## 2016-03-02 NOTE — Progress Notes (Signed)
Office Visit Note  Patient: Sally Diaz             Date of Birth: June 25, 1945           MRN: [id number]             PCP: Phineas Inches, MD Referring: Bernerd Limbo, MD Visit Date: 03/02/2016 Occupation: '@GUAROCC'$ @    Subjective:  Lower back pain   History of Present Illness: Sally Diaz is a 70 y.o. female with history of sero positive rheumatoid arthritis. She comes today with the increased pain and discomfort in her lower back. She states she has an old injury to her lower back several years ago. About 3-4 weeks ago she was doing some Christmas decorations and did not recall lifting something heavy but gradually she started having pain in her lower back. She took some muscle relaxers which were expired and did not notice much improvement in her symptoms. The pain is localized to her lower back and it does not radiate down. Her rheumatoid arthritis is fairly well controlled without any significant joint pain or joint swelling. He is a still unable to completely make a fist with her left hand. She has noticed improvement in her symptoms since she's been on methotrexate him which is been about 2 months  Activities of Daily Living:  Patient reports morning stiffness for all day hours.   Patient Reports nocturnal pain.  Difficulty dressing/grooming: Reports Difficulty climbing stairs: Reports Difficulty getting out of chair: Reports Difficulty using hands for taps, buttons, cutlery, and/or writing: Denies   Review of Systems  Constitutional: Negative for fatigue, night sweats, weight gain, weight loss and weakness.  HENT: Positive for mouth dryness. Negative for mouth sores, trouble swallowing, trouble swallowing and nose dryness.   Eyes: Negative for pain, redness, visual disturbance and dryness.  Respiratory: Negative for cough, shortness of breath and difficulty breathing.   Cardiovascular: Negative for chest pain, palpitations, hypertension, irregular heartbeat and  swelling in legs/feet.  Gastrointestinal: Negative for blood in stool, constipation and diarrhea.  Endocrine: Negative for increased urination.  Genitourinary: Negative for vaginal dryness.  Musculoskeletal: Positive for arthralgias, joint pain, myalgias, morning stiffness, muscle tenderness and myalgias. Negative for joint swelling and muscle weakness.  Skin: Negative for color change, rash, hair loss, skin tightness, ulcers and sensitivity to sunlight.  Allergic/Immunologic: Negative for susceptible to infections.  Neurological: Negative for dizziness, memory loss and night sweats.  Hematological: Negative for swollen glands.  Psychiatric/Behavioral: Negative for depressed mood and sleep disturbance. The patient is not nervous/anxious.     PMFS History:  Patient Active Problem List   Diagnosis Date Noted  . High risk medication use 03/02/2016  . DDD (degenerative disc disease), lumbar 03/02/2016  . 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 Osteoarthritis, knee 03/02/2016  . Acute bronchitis 07/17/2012  . 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[facility]) 11/11/2008  . CARCINOMA, BASAL CELL 06/23/2007  . HYPOTHYROIDISM 06/23/2007  . HYPERCHOLESTEROLEMIA 06/23/2007  . HYPERTENSION 06/23/2007  . Seasonal and perennial allergic rhinitis 06/23/2007  . OBESITY, UNSPECIFIED 05/04/2007  . ESOPHAGITIS, REFLUX 06/04/2005  . DIVERTICULOSIS, COLON 06/04/2005    Past Medical History:  Diagnosis Date  . Allergy, unspecified not elsewhere classified   . Anemia, iron deficiency   . Basal cell carcinoma   . Diverticulosis of colon   . Hx of adenomatous colonic polyps   . Hypercholesterolemia   . Hypertension   . Hypothyroidism   . Obesity   . Reflux esophagitis  Family History  Problem Relation Age of Onset  . Colon cancer Cousin     Maternal Side  . Diabetes Maternal Grandmother   . Heart disease Mother   . Bladder Cancer Mother   . Stroke Father   . Pneumonia Father   . Lung  cancer Brother    Past Surgical History:  Procedure Laterality Date  . CATARACT EXTRACTION, BILATERAL    . CERVICAL CONE BIOPSY    . TONSILLECTOMY     Social History   Social History Narrative  . No narrative on file     Objective: Vital Signs: BP 138/82   Pulse 90   Resp 18   Ht 5' 2.5" (1.588 m)   Wt 184 lb (83.5 kg)   BMI 33.12 kg/m    Physical Exam   Musculoskeletal Exam: C-spine and thoracic spine good range of motion. She has discomfort with range of motion of her lumbar spine she also has tenderness over left SI joint area. Hip joints, elbow joints, wrist joints, MCPs PIPs DIPs with good range of motion with no tenderness on examination. Hip joints knee joints ankles MTPs PIPs with good range of motion with no synovitis.  CDAI Exam: CDAI Homunculus Exam:   Joint Counts:  CDAI Tender Joint count: 0 CDAI Swollen Joint count: 0  Global Assessments:  Patient Global Assessment: 3 Provider Global Assessment: 2  CDAI Calculated Score: 5    Investigation: No additional findings. Orders Only on 02/18/2016  Component Date Value Ref Range Status  . WBC 02/18/2016 6.4  3.8 - 10.8 K/uL Final  . RBC 02/18/2016 3.83  3.80 - 5.10 MIL/uL Final  . Hemoglobin 02/18/2016 11.6* 11.7 - 15.5 g/dL Final  . HCT 57/89/7847 35.4  35.0 - 45.0 % Final  . MCV 02/18/2016 92.4  80.0 - 100.0 fL Final  . MCH 02/18/2016 30.3  27.0 - 33.0 pg Final  . MCHC 02/18/2016 32.8  32.0 - 36.0 g/dL Final  . RDW 84/02/8207 13.6  11.0 - 15.0 % Final  . Platelets 02/18/2016 270  140 - 400 K/uL Final  . MPV 02/18/2016 9.2  7.5 - 12.5 fL Final  . Neutro Abs 02/18/2016 3392  1,500 - 7,800 cells/uL Final  . Lymphs Abs 02/18/2016 1984  850 - 3,900 cells/uL Final  . Monocytes Absolute 02/18/2016 640  200 - 950 cells/uL Final  . Eosinophils Absolute 02/18/2016 320  15 - 500 cells/uL Final  . Basophils Absolute 02/18/2016 64  0 - 200 cells/uL Final  . Neutrophils Relative % 02/18/2016 53  % Final  .  Lymphocytes Relative 02/18/2016 31  % Final  . Monocytes Relative 02/18/2016 10  % Final  . Eosinophils Relative 02/18/2016 5  % Final  . Basophils Relative 02/18/2016 1  % Final  . Smear Review 02/18/2016 Criteria for review not met   Final  . Sodium 02/18/2016 140  135 - 146 mmol/L Final  . Potassium 02/18/2016 4.5  3.5 - 5.3 mmol/L Final  . Chloride 02/18/2016 103  98 - 110 mmol/L Final  . CO2 02/18/2016 30  20 - 31 mmol/L Final  . Glucose, Bld 02/18/2016 76  65 - 99 mg/dL Final  . BUN 13/88/7195 16  7 - 25 mg/dL Final  . Creat 97/47/1855 1.08* 0.60 - 0.93 mg/dL Final   Comment:   For patients > or = 71 years of age: The upper reference limit for Creatinine is approximately 13% higher for people identified as African-American.     Marland Kitchen  Total Bilirubin 02/18/2016 0.4  0.2 - 1.2 mg/dL Final  . Alkaline Phosphatase 02/18/2016 66  33 - 130 U/L Final  . AST 02/18/2016 17  10 - 35 U/L Final  . ALT 02/18/2016 14  6 - 29 U/L Final  . Total Protein 02/18/2016 7.0  6.1 - 8.1 g/dL Final  . Albumin 02/18/2016 4.2  3.6 - 5.1 g/dL Final  . Calcium 02/18/2016 9.6  8.6 - 10.4 mg/dL Final  . GFR, Est African American 02/18/2016 60  >=60 mL/min Final  . GFR, Est Non African American 02/18/2016 52* >=60 mL/min Final  Office Visit on 02/03/2016  Component Date Value Ref Range Status  . WBC 02/03/2016 4.4  3.8 - 10.8 K/uL Final  . RBC 02/03/2016 3.94  3.80 - 5.10 MIL/uL Final  . Hemoglobin 02/03/2016 11.8  11.7 - 15.5 g/dL Final  . HCT 02/03/2016 35.9  35.0 - 45.0 % Final  . MCV 02/03/2016 91.1  80.0 - 100.0 fL Final  . MCH 02/03/2016 29.9  27.0 - 33.0 pg Final  . MCHC 02/03/2016 32.9  32.0 - 36.0 g/dL Final  . RDW 02/03/2016 13.6  11.0 - 15.0 % Final  . Platelets 02/03/2016 268  140 - 400 K/uL Final  . MPV 02/03/2016 8.9  7.5 - 12.5 fL Final  . Neutro Abs 02/03/2016 2376  1,500 - 7,800 cells/uL Final  . Lymphs Abs 02/03/2016 1188  850 - 3,900 cells/uL Final  . Monocytes Absolute 02/03/2016 528   200 - 950 cells/uL Final  . Eosinophils Absolute 02/03/2016 264  15 - 500 cells/uL Final  . Basophils Absolute 02/03/2016 44  0 - 200 cells/uL Final  . Neutrophils Relative % 02/03/2016 54  % Final  . Lymphocytes Relative 02/03/2016 27  % Final  . Monocytes Relative 02/03/2016 12  % Final  . Eosinophils Relative 02/03/2016 6  % Final  . Basophils Relative 02/03/2016 1  % Final  . Smear Review 02/03/2016 Criteria for review not met   Final  . Sodium 02/03/2016 140  135 - 146 mmol/L Final  . Potassium 02/03/2016 4.3  3.5 - 5.3 mmol/L Final  . Chloride 02/03/2016 106  98 - 110 mmol/L Final  . CO2 02/03/2016 26  20 - 31 mmol/L Final  . Glucose, Bld 02/03/2016 91  65 - 99 mg/dL Final  . BUN 02/03/2016 11  7 - 25 mg/dL Final  . Creat 02/03/2016 0.97* 0.60 - 0.93 mg/dL Final   Comment:   For patients > or = 71 years of age: The upper reference limit for Creatinine is approximately 13% higher for people identified as African-American.     . Total Bilirubin 02/03/2016 0.6  0.2 - 1.2 mg/dL Final  . Alkaline Phosphatase 02/03/2016 65  33 - 130 U/L Final  . AST 02/03/2016 18  10 - 35 U/L Final  . ALT 02/03/2016 14  6 - 29 U/L Final  . Total Protein 02/03/2016 7.1  6.1 - 8.1 g/dL Final  . Albumin 02/03/2016 4.4  3.6 - 5.1 g/dL Final  . Calcium 02/03/2016 9.6  8.6 - 10.4 mg/dL Final  . GFR, Est African American 02/03/2016 68  >=60 mL/min Final  . GFR, Est Non African American 02/03/2016 59* >=60 mL/min Final  . Vit D, 25-Hydroxy 02/04/2016 47  30 - 100 ng/mL Final   Comment: Vitamin D Status           25-OH Vitamin D        Deficiency                <  20 ng/mL        Insufficiency         20 - 29 ng/mL        Optimal             > or = 30 ng/mL   For 25-OH Vitamin D testing on patients on D2-supplementation and patients for whom quantitation of D2 and D3 fractions is required, the QuestAssureD 25-OH VIT D, (D2,D3), LC/MS/MS is recommended: order code [phone] (patients > 2 yrs).       Imaging: Nm Gastric Emptying  Result Date: 02/04/2016 CLINICAL DATA:  Nausea and vomiting.  Reflux. EXAM: NUCLEAR MEDICINE GASTRIC EMPTYING SCAN TECHNIQUE: After oral ingestion of radiolabeled meal, sequential abdominal images were obtained for 4 hours. Percentage of activity emptying the stomach was calculated at 1 hour, 2 hour, 3 hour, and 4 hours. RADIOPHARMACEUTICALS:  2.0 mCi Tc-2msulfur colloid in standardized meal COMPARISON:  None. FINDINGS: Expected location of the stomach in the left upper quadrant. Ingested meal empties the stomach gradually over the course of the study. 52% emptied at 1 hr ( normal >= 10%) 87% emptied at 2 hr ( normal >= 40%) 97% emptied at 3 hr ( normal >= 70%) 4 hour imaging was not performed due to the high degree of emptying. IMPRESSION: Normal gastric emptying study. Electronically Signed   By: WVan ClinesM.D.   On: 02/04/2016 13:31   Dg Ugi W/high Density W/kub  Result Date: 02/09/2016 CLINICAL DATA:  Regurgitation of food. EXAM: UPPER GI SERIES WITH KUB TECHNIQUE: After obtaining a scout radiograph a routine upper GI series was performed using thin and high density barium. FLUOROSCOPY TIME:  Fluoroscopy Time:  1.7 minutes Radiation Exposure Index (if provided by the fluoroscopic device): 21.7 mGyair kerma Number of Acquired Spot Images: 6, the rest saved fluoroscopy COMPARISON:  None. FINDINGS: Normal bowel gas pattern. No abnormal stool retention, gas collection, or mass effect. Incidental flash laryngeal penetration. No pharyngeal diverticulum or obstruction. Cricopharyngeal impression that is transient. Normal esophageal motility. No esophageal mucosal lesion. No web or stricture. A 13 mm barium tablet easily traversed the esophagus. Small sliding hiatal hernia. Spontaneous gastroesophageal reflux was noted. No mucosal lesions abnormality noted in the stomach. No gastric outlet obstruction or abnormality of the duodenal C-loop. IMPRESSION: Small  hiatal hernia and unprovoked gastroesophageal reflux. Electronically Signed   By: JMonte FantasiaM.D.   On: 02/09/2016 09:45   Xr Lumbar Spine 2-3 Views  Result Date: 03/02/2016 She has L2-3, L4-5 narrowing. With multiple osteophytes and spondylosis. She also has some facet joint space narrowing at L4-5 level. Impression: Multilevel spondylosis with severe disc space narrowing between L4-5 7 narrowing at L2-3 mild scoliosis and spurring noted.  Xr Pelvis 1-2 Views  Result Date: 03/02/2016 SI joint space appears normalgross is was noted. Impression SI joints are normal at joints are normal   Speciality Comments: No specialty comments available.    Procedures:  No procedures performed Allergies: Neomycin-bacitracin zn-polymyx and Tetracycline   Assessment / Plan:     Visit Diagnosis  Rheumatoid Arthritis with Rheumatoid Factor and positive CCP: She is doing much better on methotrexate and Plaquenil combination. The Plaquenil was started last visit and she's been taking it for approximately 6 weeks now. She has no synovitis on examination today.   High risk medication use: She is on methotrexate 6 tablets per week and Plaquenil 300 mg a day. Her labs have been stable and her eye exam has been normal. She'll continue  on the current dosage for right now.  Lower back pain: Patient gives history of this disease of lumbar spine for multiple years she has flared her lower back with increased pain and discomfort. She's also complaining of a lot of discomfort in the left SI joint. I will obtain x-ray of her lumbar spine today. The x-rays reveal multilevel disc disease with L4-5 significant narrowing. She has no radiculopathy. I will call in prescription for Medrol Dosepak. Tizanidine 4 mg by mouth 3 times a day when necessary total 30 day supply will be given. If her symptoms persist that she supposed to notify me and the may have to evaluated through MRI.   Osteoarthritis hands: She does have  some DIP PIP thickening in her hands but no underlying synovitis today.   Osteoarthritis knees: She has some chronic discomfort in her knee joints.   Orders: Orders Placed This Encounter  Procedures  . XR Lumbar Spine 2-3 Views  . XR Pelvis 1-2 Views   No orders of the defined types were placed in this encounter.   Face-to-face time spent with patient was 30 minutes. 50% of time was spent in counseling and coordination of care.  Follow-Up Instructions: Return in about 4 months (around 07/01/2016) for Rheumatoid arthritis.   Bo Merino, MD

## 2016-03-03 LAB — COMPLETE METABOLIC PANEL WITH GFR
ALBUMIN: 4.7 g/dL (ref 3.6–5.1)
ALK PHOS: 71 U/L (ref 33–130)
ALT: 36 U/L — ABNORMAL HIGH (ref 6–29)
AST: 33 U/L (ref 10–35)
BUN: 20 mg/dL (ref 7–25)
CALCIUM: 9.9 mg/dL (ref 8.6–10.4)
CHLORIDE: 102 mmol/L (ref 98–110)
CO2: 24 mmol/L (ref 20–31)
Creat: 1.12 mg/dL — ABNORMAL HIGH (ref 0.60–0.93)
GFR, EST AFRICAN AMERICAN: 57 mL/min — AB (ref >=60)
GFR, Est Non African American: 50 mL/min — ABNORMAL LOW (ref >=60)
Glucose, Bld: 101 mg/dL — ABNORMAL HIGH (ref 65–99)
POTASSIUM: 3.7 mmol/L (ref 3.5–5.3)
Sodium: 138 mmol/L (ref 135–146)
Total Bilirubin: 0.6 mg/dL (ref 0.2–1.2)
Total Protein: 7.4 g/dL (ref 6.1–8.1)

## 2016-03-03 NOTE — Progress Notes (Signed)
Tell patient: #1: CMP with GFR is normal except for slight elevation of creatinine. We can monitor.GFR is 50 and close to normal which we can monitor.#2: CBC with differential is normal  No change in treatment

## 2016-03-17 ENCOUNTER — Other Ambulatory Visit: Payer: Self-pay | Admitting: Radiology

## 2016-03-17 DIAGNOSIS — Z79899 Other long term (current) drug therapy: Secondary | ICD-10-CM

## 2016-03-17 LAB — COMPLETE METABOLIC PANEL WITH GFR
ALBUMIN: 4.1 g/dL (ref 3.6–5.1)
ALK PHOS: 65 U/L (ref 33–130)
ALT: 20 U/L (ref 6–29)
AST: 23 U/L (ref 10–35)
BILIRUBIN TOTAL: 0.7 mg/dL (ref 0.2–1.2)
BUN: 13 mg/dL (ref 7–25)
CALCIUM: 9.6 mg/dL (ref 8.6–10.4)
CO2: 23 mmol/L (ref 20–31)
Chloride: 106 mmol/L (ref 98–110)
Creat: 1.16 mg/dL — ABNORMAL HIGH (ref 0.60–0.93)
GFR, EST AFRICAN AMERICAN: 55 mL/min — AB (ref >=60)
GFR, EST NON AFRICAN AMERICAN: 47 mL/min — AB (ref >=60)
GLUCOSE: 96 mg/dL (ref 65–99)
POTASSIUM: 4.2 mmol/L (ref 3.5–5.3)
Sodium: 136 mmol/L (ref 135–146)
TOTAL PROTEIN: 6.8 g/dL (ref 6.1–8.1)

## 2016-03-17 LAB — CBC WITH DIFFERENTIAL/PLATELET
BASOS ABS: 0 {cells}/uL (ref 0–200)
Basophils Relative: 0 %
EOS ABS: 260 {cells}/uL (ref 15–500)
Eosinophils Relative: 4 %
HEMATOCRIT: 34.6 % — AB (ref 35.0–45.0)
HEMOGLOBIN: 11.4 g/dL — AB (ref 11.7–15.5)
LYMPHS ABS: 1560 {cells}/uL (ref 850–3900)
Lymphocytes Relative: 24 %
MCH: 29.8 pg (ref 27.0–33.0)
MCHC: 32.9 g/dL (ref 32.0–36.0)
MCV: 90.6 fL (ref 80.0–100.0)
MPV: 9.3 fL (ref 7.5–12.5)
Monocytes Absolute: 650 cells/uL (ref 200–950)
Monocytes Relative: 10 %
NEUTROS ABS: 4030 {cells}/uL (ref 1500–7800)
NEUTROS PCT: 62 %
Platelets: 262 10*3/uL (ref 140–400)
RBC: 3.82 MIL/uL (ref 3.80–5.10)
RDW: 14.6 % (ref 11.0–15.0)
WBC: 6.5 10*3/uL (ref 3.8–10.8)

## 2016-03-18 NOTE — Progress Notes (Signed)
Tell patient: #1: CMP with GFR shows slightly elevated creatinine but consistent with past labs. GFR slightly low at 47 but consistent with past labs. Overall kidney function is slightly lower than before. We will need to continue to monitor the kidneys#2: CBC with differential is within normal limits.  #3: Will discuss in detail at follow-up on 04/07/2016.

## 2016-03-24 ENCOUNTER — Telehealth: Payer: Self-pay | Admitting: Rheumatology

## 2016-03-24 NOTE — Telephone Encounter (Signed)
Patient called for lab results. She is due to have her next labs drawn on 01/10 and will be out of town. Should she come on Monday 01/08 or 01/16 cb# [phone]

## 2016-03-24 NOTE — Telephone Encounter (Signed)
Patient advised that she should come in on 03/29/16 for her labs.

## 2016-03-29 ENCOUNTER — Other Ambulatory Visit: Payer: Self-pay | Admitting: *Deleted

## 2016-03-29 DIAGNOSIS — Z79899 Other long term (current) drug therapy: Secondary | ICD-10-CM

## 2016-03-29 LAB — COMPLETE METABOLIC PANEL WITH GFR
ALT: 43 U/L — AB (ref 6–29)
AST: 28 U/L (ref 10–35)
Albumin: 4.1 g/dL (ref 3.6–5.1)
Alkaline Phosphatase: 62 U/L (ref 33–130)
BILIRUBIN TOTAL: 0.6 mg/dL (ref 0.2–1.2)
BUN: 21 mg/dL (ref 7–25)
CHLORIDE: 102 mmol/L (ref 98–110)
CO2: 27 mmol/L (ref 20–31)
Calcium: 9.5 mg/dL (ref 8.6–10.4)
Creat: 1.13 mg/dL — ABNORMAL HIGH (ref 0.60–0.93)
GFR, EST AFRICAN AMERICAN: 57 mL/min — AB (ref >=60)
GFR, EST NON AFRICAN AMERICAN: 49 mL/min — AB (ref >=60)
GLUCOSE: 92 mg/dL (ref 65–99)
Potassium: 4.1 mmol/L (ref 3.5–5.3)
SODIUM: 138 mmol/L (ref 135–146)
TOTAL PROTEIN: 7.1 g/dL (ref 6.1–8.1)

## 2016-03-29 LAB — CBC WITH DIFFERENTIAL/PLATELET
BASOS PCT: 1 %
Basophils Absolute: 48 cells/uL (ref 0–200)
EOS PCT: 6 %
Eosinophils Absolute: 288 cells/uL (ref 15–500)
HCT: 32.9 % — ABNORMAL LOW (ref 35.0–45.0)
Hemoglobin: 11.1 g/dL — ABNORMAL LOW (ref 11.7–15.5)
LYMPHS PCT: 29 %
Lymphs Abs: 1392 cells/uL (ref 850–3900)
MCH: 30.8 pg (ref 27.0–33.0)
MCHC: 33.7 g/dL (ref 32.0–36.0)
MCV: 91.4 fL (ref 80.0–100.0)
MONOS PCT: 7 %
MPV: 8.7 fL (ref 7.5–12.5)
Monocytes Absolute: 336 cells/uL (ref 200–950)
NEUTROS ABS: 2736 {cells}/uL (ref 1500–7800)
Neutrophils Relative %: 57 %
PLATELETS: 314 10*3/uL (ref 140–400)
RBC: 3.6 MIL/uL — ABNORMAL LOW (ref 3.80–5.10)
RDW: 15 % (ref 11.0–15.0)
WBC: 4.8 10*3/uL (ref 3.8–10.8)

## 2016-03-31 ENCOUNTER — Ambulatory Visit: Payer: Medicare Other | Admitting: Gastroenterology

## 2016-04-02 ENCOUNTER — Encounter: Payer: Self-pay | Admitting: Gastroenterology

## 2016-04-07 ENCOUNTER — Ambulatory Visit: Payer: Medicare Other | Admitting: Gastroenterology

## 2016-04-13 ENCOUNTER — Other Ambulatory Visit: Payer: Self-pay | Admitting: *Deleted

## 2016-04-13 DIAGNOSIS — Z79899 Other long term (current) drug therapy: Secondary | ICD-10-CM

## 2016-04-13 LAB — COMPLETE METABOLIC PANEL WITH GFR
ALBUMIN: 4.1 g/dL (ref 3.6–5.1)
ALK PHOS: 55 U/L (ref 33–130)
ALT: 40 U/L — AB (ref 6–29)
AST: 31 U/L (ref 10–35)
BILIRUBIN TOTAL: 0.7 mg/dL (ref 0.2–1.2)
BUN: 12 mg/dL (ref 7–25)
CO2: 26 mmol/L (ref 20–31)
CREATININE: 0.97 mg/dL — AB (ref 0.60–0.93)
Calcium: 9.1 mg/dL (ref 8.6–10.4)
Chloride: 107 mmol/L (ref 98–110)
GFR, EST AFRICAN AMERICAN: 68 mL/min (ref >=60)
GFR, EST NON AFRICAN AMERICAN: 59 mL/min — AB (ref >=60)
Glucose, Bld: 87 mg/dL (ref 65–99)
Potassium: 4.1 mmol/L (ref 3.5–5.3)
Sodium: 140 mmol/L (ref 135–146)
TOTAL PROTEIN: 6.6 g/dL (ref 6.1–8.1)

## 2016-04-13 LAB — CBC WITH DIFFERENTIAL/PLATELET
BASOS ABS: 41 {cells}/uL (ref 0–200)
BASOS PCT: 1 %
EOS ABS: 287 {cells}/uL (ref 15–500)
Eosinophils Relative: 7 %
HEMATOCRIT: 31.9 % — AB (ref 35.0–45.0)
HEMOGLOBIN: 10.7 g/dL — AB (ref 11.7–15.5)
LYMPHS ABS: 1558 {cells}/uL (ref 850–3900)
Lymphocytes Relative: 38 %
MCH: 30.9 pg (ref 27.0–33.0)
MCHC: 33.5 g/dL (ref 32.0–36.0)
MCV: 92.2 fL (ref 80.0–100.0)
MONO ABS: 369 {cells}/uL (ref 200–950)
MPV: 8.9 fL (ref 7.5–12.5)
Monocytes Relative: 9 %
NEUTROS ABS: 1845 {cells}/uL (ref 1500–7800)
Neutrophils Relative %: 45 %
PLATELETS: 268 10*3/uL (ref 140–400)
RBC: 3.46 MIL/uL — ABNORMAL LOW (ref 3.80–5.10)
RDW: 15.7 % — ABNORMAL HIGH (ref 11.0–15.0)
WBC: 4.1 10*3/uL (ref 3.8–10.8)

## 2016-04-13 MED ORDER — METHOTREXATE 2.5 MG PO TABS: 15.0000 mg | ORAL_TABLET | ORAL | 0 refills | Status: DC

## 2016-04-13 NOTE — Telephone Encounter (Signed)
Patient is here today to get the labs done.Okay to refill methotrexate at 6 pills per week; ninety-day supplyShe also is on Plaquenil 3 mg daily which she will continue.

## 2016-04-13 NOTE — Telephone Encounter (Signed)
Last Visit: 03/02/16 Next Visit: 06/21/16 Labs: 03/29/16 Creat 1.13, ALT 43, GFR 49  Okay to refill MTX?

## 2016-04-28 ENCOUNTER — Other Ambulatory Visit (INDEPENDENT_AMBULATORY_CARE_PROVIDER_SITE_OTHER): Payer: Self-pay

## 2016-04-28 DIAGNOSIS — Z79899 Other long term (current) drug therapy: Secondary | ICD-10-CM

## 2016-04-28 LAB — CBC WITH DIFFERENTIAL/PLATELET
BASOS ABS: 51 {cells}/uL (ref 0–200)
BASOS PCT: 1 %
EOS ABS: 204 {cells}/uL (ref 15–500)
Eosinophils Relative: 4 %
HEMATOCRIT: 33.4 % — AB (ref 35.0–45.0)
HEMOGLOBIN: 11 g/dL — AB (ref 11.7–15.5)
LYMPHS ABS: 1530 {cells}/uL (ref 850–3900)
Lymphocytes Relative: 30 %
MCH: 31 pg (ref 27.0–33.0)
MCHC: 32.9 g/dL (ref 32.0–36.0)
MCV: 94.1 fL (ref 80.0–100.0)
MONO ABS: 561 {cells}/uL (ref 200–950)
MONOS PCT: 11 %
MPV: 9 fL (ref 7.5–12.5)
NEUTROS ABS: 2754 {cells}/uL (ref 1500–7800)
Neutrophils Relative %: 54 %
Platelets: 283 10*3/uL (ref 140–400)
RBC: 3.55 MIL/uL — ABNORMAL LOW (ref 3.80–5.10)
RDW: 16.5 % — ABNORMAL HIGH (ref 11.0–15.0)
WBC: 5.1 10*3/uL (ref 3.8–10.8)

## 2016-04-28 LAB — COMPLETE METABOLIC PANEL WITH GFR
ALBUMIN: 4.4 g/dL (ref 3.6–5.1)
ALK PHOS: 58 U/L (ref 33–130)
ALT: 24 U/L (ref 6–29)
AST: 22 U/L (ref 10–35)
BILIRUBIN TOTAL: 0.8 mg/dL (ref 0.2–1.2)
BUN: 22 mg/dL (ref 7–25)
CALCIUM: 10 mg/dL (ref 8.6–10.4)
CO2: 29 mmol/L (ref 20–31)
CREATININE: 1.29 mg/dL — AB (ref 0.60–0.93)
Chloride: 104 mmol/L (ref 98–110)
GFR, EST AFRICAN AMERICAN: 48 mL/min — AB (ref >=60)
GFR, Est Non African American: 42 mL/min — ABNORMAL LOW (ref >=60)
Glucose, Bld: 94 mg/dL (ref 65–99)
Potassium: 4.2 mmol/L (ref 3.5–5.3)
Sodium: 141 mmol/L (ref 135–146)
TOTAL PROTEIN: 7 g/dL (ref 6.1–8.1)

## 2016-05-03 ENCOUNTER — Telehealth: Payer: Self-pay | Admitting: Rheumatology

## 2016-05-03 NOTE — Telephone Encounter (Signed)
Patient would like lab results as soon as we get them

## 2016-05-03 NOTE — Telephone Encounter (Signed)
Called patient and reviewed lab results. Patient verbalized understanding.

## 2016-05-05 ENCOUNTER — Ambulatory Visit: Payer: Medicare Other | Admitting: Rheumatology

## 2016-05-12 ENCOUNTER — Other Ambulatory Visit: Payer: Self-pay | Admitting: *Deleted

## 2016-05-12 DIAGNOSIS — Z79899 Other long term (current) drug therapy: Secondary | ICD-10-CM

## 2016-05-12 LAB — COMPLETE METABOLIC PANEL WITH GFR
ALK PHOS: 57 U/L (ref 33–130)
ALT: 34 U/L — ABNORMAL HIGH (ref 6–29)
AST: 24 U/L (ref 10–35)
Albumin: 4.3 g/dL (ref 3.6–5.1)
BILIRUBIN TOTAL: 0.6 mg/dL (ref 0.2–1.2)
BUN: 18 mg/dL (ref 7–25)
CO2: 24 mmol/L (ref 20–31)
Calcium: 9.8 mg/dL (ref 8.6–10.4)
Chloride: 106 mmol/L (ref 98–110)
Creat: 1.08 mg/dL — ABNORMAL HIGH (ref 0.60–0.93)
GFR, EST AFRICAN AMERICAN: 60 mL/min (ref >=60)
GFR, EST NON AFRICAN AMERICAN: 52 mL/min — AB (ref >=60)
Glucose, Bld: 88 mg/dL (ref 65–99)
Potassium: 4.4 mmol/L (ref 3.5–5.3)
Sodium: 141 mmol/L (ref 135–146)
TOTAL PROTEIN: 6.9 g/dL (ref 6.1–8.1)

## 2016-05-12 LAB — CBC WITH DIFFERENTIAL/PLATELET
BASOS ABS: 37 {cells}/uL (ref 0–200)
Basophils Relative: 1 %
Eosinophils Absolute: 185 cells/uL (ref 15–500)
Eosinophils Relative: 5 %
HCT: 32.9 % — ABNORMAL LOW (ref 35.0–45.0)
Hemoglobin: 10.8 g/dL — ABNORMAL LOW (ref 11.7–15.5)
LYMPHS PCT: 33 %
Lymphs Abs: 1221 cells/uL (ref 850–3900)
MCH: 31 pg (ref 27.0–33.0)
MCHC: 32.8 g/dL (ref 32.0–36.0)
MCV: 94.5 fL (ref 80.0–100.0)
MONOS PCT: 10 %
MPV: 9 fL (ref 7.5–12.5)
Monocytes Absolute: 370 cells/uL (ref 200–950)
NEUTROS ABS: 1887 {cells}/uL (ref 1500–7800)
NEUTROS PCT: 51 %
PLATELETS: 268 10*3/uL (ref 140–400)
RBC: 3.48 MIL/uL — ABNORMAL LOW (ref 3.80–5.10)
RDW: 16.2 % — ABNORMAL HIGH (ref 11.0–15.0)
WBC: 3.7 10*3/uL — ABNORMAL LOW (ref 3.8–10.8)

## 2016-05-17 ENCOUNTER — Telehealth: Payer: Self-pay | Admitting: Radiology

## 2016-05-17 NOTE — Telephone Encounter (Signed)
Called patient/ advised labs stable, will monitor  Dr Coletta Memos can view.

## 2016-05-17 NOTE — Telephone Encounter (Signed)
-----   Message from [place], [place] sent at 05/13/2016 11:31 AM EST ----- Send copy of labs to PCP Tell patient CMP with GFR normal except for elevated creatinine 1.08 and decreased GFR at 52 but improved compared to last visit CBC with differential is within normal limits with mild anemia. (Consistent with past labs)

## 2016-05-25 ENCOUNTER — Encounter: Payer: Self-pay | Admitting: Gastroenterology

## 2016-05-25 ENCOUNTER — Encounter (INDEPENDENT_AMBULATORY_CARE_PROVIDER_SITE_OTHER): Payer: Self-pay

## 2016-05-25 ENCOUNTER — Ambulatory Visit (INDEPENDENT_AMBULATORY_CARE_PROVIDER_SITE_OTHER): Payer: Medicare Other | Admitting: Gastroenterology

## 2016-05-25 VITALS — BP 130/72 | HR 80 | Ht 63.0 in | Wt 194.4 lb

## 2016-05-25 DIAGNOSIS — Z86010 Personal history of colonic polyps: Secondary | ICD-10-CM | POA: Diagnosis not present

## 2016-05-25 DIAGNOSIS — K219 Gastro-esophageal reflux disease without esophagitis: Secondary | ICD-10-CM | POA: Diagnosis not present

## 2016-05-25 MED ORDER — NA SULFATE-K SULFATE-MG SULF 17.5-3.13-1.6 GM/177ML PO SOLN: 1.0000 | Freq: Once | ORAL | 0 refills | Status: AC

## 2016-05-25 NOTE — Patient Instructions (Signed)
You have been scheduled for a colonoscopy. Please follow written instructions given to you at your visit today.  Please pick up your prep supplies at the pharmacy within the next 1-3 days. If you use inhalers (even only as needed), please bring them with you on the day of your procedure. Your physician has requested that you go to www.startemmi.com and enter the access code given to you at your visit today. This web site gives a general overview about your procedure. However, you should still follow specific instructions given to you by our office regarding your preparation for the procedure.  Use Gaviscon after meals three times a day   Follow up as needed

## 2016-05-25 NOTE — Progress Notes (Signed)
Sally Diaz    NO:[id number]    October 02, 1945  Primary Care Physician:BOUSKA,DAVID E, MD  Referring Physician: Bernerd Limbo, MD [place], [place] [id number]  Chief complaint: Nausea  HPI: 71 year old female with history of rheumatoid arthritis, hypothyroidism, hypertension, hyperlipidemia and GERD here for follow-up visit. She was last seen in office 01/20/2016 with complaints of nausea. She noticed improvement of nausea and her symptoms since she stopped dark chocolate , caffeine and is eating small meals. Rheumatoid arthritis is better controlled on methotrexate.  She had gastric emptying scan that showed normal emptying at 4 hours. Barium upper GI series was unremarkable except for unprovoked GERD   Outpatient Encounter Prescriptions as of 05/25/2016  Medication Sig  . albuterol (PROVENTIL HFA;VENTOLIN HFA) 108 (90 BASE) MCG/ACT inhaler Use 2 puffs every 4-6 hours as needed  . calcium gluconate 500 MG tablet Take 1 tablet by mouth 2 (two) times daily.  . chlorthalidone (HYGROTON) 25 MG tablet Take 25 mg by mouth as needed.   . Cholecalciferol (VITAMIN D3) 5000 units TBDP Take 1 tablet by mouth daily.  Marland Kitchen Co-Enzyme Q-10 30 MG CAPS Take 100 mg by mouth.  . fish oil-omega-3 fatty acids 1000 MG capsule Take 2 capsules by mouth daily. 1 daily  . folic acid (FOLVITE) 1 MG tablet Take 2 tablets (2 mg total) by mouth daily.  . Glutathione 50 MG TABS Take by mouth.  . irbesartan (AVAPRO) 300 MG tablet Take 150 mg by mouth daily.    Marland Kitchen LIOTHYRONINE SODIUM PO Take 1 tablet by mouth daily. T4, T3 compounded medication  . methotrexate (RHEUMATREX) 2.5 MG tablet Take 6 tablets (15 mg total) by mouth once a week. Caution:Chemotherapy. Protect from light.  . Misc Natural Products (TART CHERRY ADVANCED PO) Take 1 tablet by mouth 2 (two) times daily.   . NON FORMULARY Progesterone compound 25mg  take 1 tablet daily  . Nutritional Supplements (DHEA) 15-50 MG CAPS Take 15 mg  by mouth daily.  . Pregnenolone POWD Take 2 mg by mouth daily.  . Probiotic Product (PROBIOTIC-10 PO) Take by mouth.  . ranitidine (ZANTAC 75) 75 MG tablet Take 1 tablet (75 mg total) by mouth 2 (two) times daily.  Marland Kitchen tiZANidine (ZANAFLEX) 4 MG tablet Every 8 hours as needed for muscle spasms for up to 10 days then reduce dose to daily as needed.  . vitamin E 400 UNIT capsule Take by mouth.  . [DISCONTINUED] methylPREDNISolone (MEDROL DOSEPAK) 4 MG TBPK tablet Take as directed per package instructions  . [DISCONTINUED] pantoprazole (PROTONIX) 40 MG tablet Take 1 tablet (40 mg total) by mouth 2 (two) times daily before a meal.   No facility-administered encounter medications on file as of 05/25/2016.     Allergies as of 05/25/2016 - Review Complete 05/25/2016  Allergen Reaction Noted  . Neomycin-bacitracin zn-polymyx Rash   . Tetracycline Swelling     Past Medical History:  Diagnosis Date  . Allergy, unspecified not elsewhere classified   . Anemia, iron deficiency   . Basal cell carcinoma   . Diverticulosis of colon   . Hx of adenomatous colonic polyps   . Hypercholesterolemia   . Hypertension   . Hypothyroidism   . Obesity   . Reflux esophagitis     Past Surgical History:  Procedure Laterality Date  . CATARACT EXTRACTION, BILATERAL    . CERVICAL CONE BIOPSY    . ENDOMETRIAL ABLATION    . TONSILLECTOMY  Family History  Problem Relation Age of Onset  . Colon cancer Cousin     Maternal Side  . Diabetes Maternal Grandmother   . Aneurysm Maternal Grandmother     brain  . Heart disease Mother   . Bladder Cancer Mother   . Stroke Father   . Pneumonia Father   . Lung cancer Brother   . AAA (abdominal aortic aneurysm) Brother     Social History   Social History  . Marital status: Married    Spouse name: N/A  . Number of children: N/A  . Years of education: N/A   Occupational History  . Retired    Social History Main Topics  . Smoking status: Never Smoker  .  Smokeless tobacco: Never Used  . Alcohol use No  . Drug use: No  . Sexual activity: Not on file   Other Topics Concern  . Not on file   Social History Narrative  . No narrative on file      Review of systems: Review of Systems  Constitutional: Negative for fever and chills.  HENT: Negative.   Eyes: Negative for blurred vision.  Respiratory: Positive for cough and wheezing.   negative for shortness of breath Cardiovascular: Negative for chest pain and palpitations.  Gastrointestinal: as per HPI Genitourinary: Negative for dysuria, urgency, frequency and hematuria.  Musculoskeletal: Negative for myalgias, back pain and joint pain.  Skin: Negative for itching and rash.  Neurological: Negative for dizziness, tremors, focal weakness, seizures and loss of consciousness.  Endo/Heme/Allergies: Positive for seasonal allergies.  Psychiatric/Behavioral: Negative for depression, suicidal ideas and hallucinations.  All other systems reviewed and are negative.   Physical Exam: Vitals:   05/25/16 0903  BP: 130/72  Pulse: 80   Body mass index is 34.44 kg/m. Gen:      No acute distress HEENT:  EOMI, sclera anicteric Neck:     No masses; no thyromegaly Lungs:    Clear to auscultation bilaterally; normal respiratory effort CV:         Regular rate and rhythm; no murmurs Abd:      + bowel sounds; soft, non-tender; no palpable masses, no distension Ext:    No edema; adequate peripheral perfusion Skin:      Warm and dry; no rash Neuro: alert and oriented x 3 Psych: normal mood and affect  Data Reviewed:  Reviewed labs, radiology imaging, old records and pertinent past GI work up  Personally reviewed the images of upper GI series and discussed the results with patient  Assessment and Plan/Recommendations:  71 year old female with history of rheumatoid arthritis on methotrexate and chronic GERD here for follow-up visit GERD symptoms improved with diet changes and antireflux  measures She is currently taking ranitidine 75 mg twice daily, not on PPI with good symptom control Small meals Avoid meals 3 hours before bedtime and sleep with head end elevation Gaviscon as needed after meals for breakthrough symptoms  Last colonoscopy in March 2007, past due for surveillance colonoscopy recall We'll schedule it The risks and benefits as well as alternatives of endoscopic procedure(s) have been discussed and reviewed. All questions answered. The patient agrees to proceed.   25 minutes was spent face-to-face with the patient. Greater than 50% of the time used for counseling as well as treatment plan and follow-up. She had multiple questions which were answered to her satisfaction  K. Denzil Magnuson , MD [phone] Mon-Fri 8a-5p [phone] after 5p, weekends, holidays  CC: Bernerd Limbo, MD

## 2016-05-26 ENCOUNTER — Other Ambulatory Visit: Payer: Self-pay | Admitting: *Deleted

## 2016-05-26 DIAGNOSIS — Z79899 Other long term (current) drug therapy: Secondary | ICD-10-CM

## 2016-05-26 LAB — CBC WITH DIFFERENTIAL/PLATELET
BASOS ABS: 40 {cells}/uL (ref 0–200)
Basophils Relative: 1 %
EOS ABS: 240 {cells}/uL (ref 15–500)
Eosinophils Relative: 6 %
HCT: 32.3 % — ABNORMAL LOW (ref 35.0–45.0)
HEMOGLOBIN: 10.7 g/dL — AB (ref 11.7–15.5)
Lymphocytes Relative: 35 %
Lymphs Abs: 1400 cells/uL (ref 850–3900)
MCH: 32.1 pg (ref 27.0–33.0)
MCHC: 33.1 g/dL (ref 32.0–36.0)
MCV: 97 fL (ref 80.0–100.0)
MPV: 9 fL (ref 7.5–12.5)
Monocytes Absolute: 440 cells/uL (ref 200–950)
Monocytes Relative: 11 %
NEUTROS ABS: 1880 {cells}/uL (ref 1500–7800)
Neutrophils Relative %: 47 %
PLATELETS: 252 10*3/uL (ref 140–400)
RBC: 3.33 MIL/uL — ABNORMAL LOW (ref 3.80–5.10)
RDW: 15.2 % — ABNORMAL HIGH (ref 11.0–15.0)
WBC: 4 10*3/uL (ref 3.8–10.8)

## 2016-05-26 LAB — COMPLETE METABOLIC PANEL WITH GFR
ALBUMIN: 4.1 g/dL (ref 3.6–5.1)
ALK PHOS: 54 U/L (ref 33–130)
ALT: 23 U/L (ref 6–29)
AST: 22 U/L (ref 10–35)
BILIRUBIN TOTAL: 0.6 mg/dL (ref 0.2–1.2)
BUN: 14 mg/dL (ref 7–25)
CALCIUM: 9.6 mg/dL (ref 8.6–10.4)
CO2: 26 mmol/L (ref 20–31)
Chloride: 106 mmol/L (ref 98–110)
Creat: 0.93 mg/dL (ref 0.60–0.93)
GFR, EST AFRICAN AMERICAN: 72 mL/min (ref >=60)
GFR, EST NON AFRICAN AMERICAN: 62 mL/min (ref >=60)
Glucose, Bld: 90 mg/dL (ref 65–99)
Potassium: 4.2 mmol/L (ref 3.5–5.3)
Sodium: 141 mmol/L (ref 135–146)
TOTAL PROTEIN: 6.8 g/dL (ref 6.1–8.1)

## 2016-06-07 ENCOUNTER — Ambulatory Visit (AMBULATORY_SURGERY_CENTER): Payer: Medicare Other | Admitting: Gastroenterology

## 2016-06-07 ENCOUNTER — Encounter: Payer: Self-pay | Admitting: Gastroenterology

## 2016-06-07 VITALS — BP 126/78 | HR 71 | Temp 97.1°F | Resp 18 | Ht 63.0 in | Wt 194.0 lb

## 2016-06-07 DIAGNOSIS — Z86010 Personal history of colonic polyps: Secondary | ICD-10-CM

## 2016-06-07 DIAGNOSIS — K635 Polyp of colon: Secondary | ICD-10-CM

## 2016-06-07 DIAGNOSIS — D122 Benign neoplasm of ascending colon: Secondary | ICD-10-CM

## 2016-06-07 DIAGNOSIS — D126 Benign neoplasm of colon, unspecified: Secondary | ICD-10-CM

## 2016-06-07 DIAGNOSIS — D123 Benign neoplasm of transverse colon: Secondary | ICD-10-CM

## 2016-06-07 MED ORDER — SODIUM CHLORIDE 0.9 % IV SOLN: 500.0000 mL | INTRAVENOUS | Status: DC

## 2016-06-07 NOTE — Progress Notes (Signed)
Called to room to assist during endoscopic procedure.  Patient ID and intended procedure confirmed with present staff. Received instructions for my participation in the procedure from the performing physician.  

## 2016-06-07 NOTE — Progress Notes (Signed)
A/ox3 pleased with MAC, report to [employer]

## 2016-06-07 NOTE — Progress Notes (Signed)
Pt's states no medical or surgical changes since previsit or office visit. 

## 2016-06-07 NOTE — Op Note (Signed)
[place] Patient Name: Sally Diaz Procedure Date: 06/07/2016 9:01 AM MRN: [id number] Endoscopist: Mauri Pole , MD Age: 71 Referring MD:  Date of Birth: [birth year] Gender: Female Account #: [id number] Procedure:                Colonoscopy Indications:              High risk colon cancer surveillance: Personal                            history of colonic polyps, Last colonoscopy: 2007 Medicines:                Monitored Anesthesia Care Procedure:                Pre-Anesthesia Assessment:                           - Prior to the procedure, a History and Physical                            was performed, and patient medications and                            allergies were reviewed. The patient's tolerance of                            previous anesthesia was also reviewed. The risks                            and benefits of the procedure and the sedation                            options and risks were discussed with the patient.                            All questions were answered, and informed consent                            was obtained. Prior Anticoagulants: The patient has                            taken no previous anticoagulant or antiplatelet                            agents. ASA Grade Assessment: II - A patient with                            mild systemic disease. After reviewing the risks                            and benefits, the patient was deemed in                            satisfactory condition to undergo the procedure.  After obtaining informed consent, the colonoscope                            was passed under direct vision. Throughout the                            procedure, the patient's blood pressure, pulse, and                            oxygen saturations were monitored continuously. The                            Colonoscope was introduced through the anus and                            advanced  to the the terminal ileum, with                            identification of the appendiceal orifice and IC                            valve. The colonoscopy was performed without                            difficulty. The patient tolerated the procedure                            well. The quality of the bowel preparation was                            excellent. The terminal ileum, ileocecal valve,                            appendiceal orifice, and rectum were photographed. Scope In: 9:22:28 AM Scope Out: 9:43:38 AM Scope Withdrawal Time: 0 hours 16 minutes 6 seconds  Total Procedure Duration: 0 hours 21 minutes 10 seconds  Findings:                 The perianal and digital rectal examinations were                            normal.                           A 11 mm polyp was found in the ascending colon. The                            polyp was semi-sessile. The polyp was removed with                            a cold snare. Resection and retrieval were complete.                           A 4 mm polyp was found in the transverse colon.  The                            polyp was sessile. The polyp was removed with a                            cold biopsy forceps. Resection and retrieval were                            complete.                           Multiple small and large-mouthed diverticula were                            found in the sigmoid colon and descending colon.                           Non-bleeding internal hemorrhoids were found during                            retroflexion. The hemorrhoids were medium-sized.                           The exam was otherwise without abnormality. Complications:            No immediate complications. Estimated Blood Loss:     Estimated blood loss: none. Impression:               - One 11 mm polyp in the ascending colon, removed                            with a cold snare. Resected and retrieved.                           - One 4 mm  polyp in the transverse colon, removed                            with a cold biopsy forceps. Resected and retrieved.                           - Diverticulosis in the sigmoid colon and in the                            descending colon.                           - Non-bleeding internal hemorrhoids.                           - The examination was otherwise normal. Recommendation:           - Patient has a contact number available for                            emergencies. The signs and symptoms of potential  delayed complications were discussed with the                            patient. Return to normal activities tomorrow.                            Written discharge instructions were provided to the                            patient.                           - Resume previous diet.                           - Continue present medications.                           - Await pathology results.                           - Repeat colonoscopy in 3 - 5 years for                            surveillance based on pathology results. Mauri Pole, MD 06/07/2016 9:49:50 AM This report has been signed electronically.

## 2016-06-07 NOTE — Patient Instructions (Signed)
YOU HAD AN ENDOSCOPIC PROCEDURE TODAY AT THE [facility]:   Refer to the procedure report that was given to you for any specific questions about what was found during the examination.  If the procedure report does not answer your questions, please call your gastroenterologist to clarify.  If you requested that your care partner not be given the details of your procedure findings, then the procedure report has been included in a sealed envelope for you to review at your convenience later.  YOU SHOULD EXPECT: Some feelings of bloating in the abdomen. Passage of more gas than usual.  Walking can help get rid of the air that was put into your GI tract during the procedure and reduce the bloating. If you had a lower endoscopy (such as a colonoscopy or flexible sigmoidoscopy) you may notice spotting of blood in your stool or on the toilet paper. If you underwent a bowel prep for your procedure, you may not have a normal bowel movement for a few days.  Please Note:  You might notice some irritation and congestion in your nose or some drainage.  This is from the oxygen used during your procedure.  There is no need for concern and it should clear up in a day or so.  SYMPTOMS TO REPORT IMMEDIATELY:   Following lower endoscopy (colonoscopy or flexible sigmoidoscopy):  Excessive amounts of blood in the stool  Significant tenderness or worsening of abdominal pains  Swelling of the abdomen that is new, acute  Fever of 100F or higher  For urgent or emergent issues, a gastroenterologist can be reached at any hour by calling [phone].  DIET:  We do recommend a small meal at first, but then you may proceed to your regular diet.  Drink plenty of fluids but you should avoid alcoholic beverages for 24 hours.  ACTIVITY:  You should plan to take it easy for the rest of today and you should NOT DRIVE or use heavy machinery until tomorrow (because of the sedation medicines used during the test).     FOLLOW UP: Our staff will call the number listed on your records the next business day following your procedure to check on you and address any questions or concerns that you may have regarding the information given to you following your procedure. If we do not reach you, we will leave a message.  However, if you are feeling well and you are not experiencing any problems, there is no need to return our call.  We will assume that you have returned to your regular daily activities without incident.  If any biopsies were taken you will be contacted by phone or by letter within the next 1-3 weeks.  Please call us at [phone] if you have not heard about the biopsies in 3 weeks.   SIGNATURES/CONFIDENTIALITY: You and/or your care partner have signed paperwork which will be entered into your electronic medical record.  These signatures attest to the fact that that the information above on your After Visit Summary has been reviewed and is understood.  Full responsibility of the confidentiality of this discharge information lies with you and/or your care-partner.  Await pathology  Please read over handouts about polyps, diverticulosis, hemorrhoids and high fiber diets  Continue your normal medications 

## 2016-06-08 ENCOUNTER — Telehealth: Payer: Self-pay

## 2016-06-08 NOTE — Telephone Encounter (Signed)
  Follow up Call-  Call back number 06/07/2016  Post procedure Call Back phone  # [phone]  Permission to leave phone message Yes  Some recent data might be hidden     Patient questions:  Do you have a fever, pain , or abdominal swelling? No. Pain Score  0 *  Have you tolerated food without any problems? Yes.    Have you been able to return to your normal activities? Yes.    Do you have any questions about your discharge instructions: Diet   No. Medications  No. Follow up visit  No.  Do you have questions or concerns about your Care? No.  Actions: * If pain score is 4 or above: No action needed, pain <4.

## 2016-06-09 ENCOUNTER — Other Ambulatory Visit: Payer: Self-pay | Admitting: *Deleted

## 2016-06-09 DIAGNOSIS — Z79899 Other long term (current) drug therapy: Secondary | ICD-10-CM

## 2016-06-09 LAB — COMPLETE METABOLIC PANEL WITH GFR
ALT: 26 U/L (ref 6–29)
AST: 21 U/L (ref 10–35)
Albumin: 4.1 g/dL (ref 3.6–5.1)
Alkaline Phosphatase: 59 U/L (ref 33–130)
BILIRUBIN TOTAL: 0.7 mg/dL (ref 0.2–1.2)
BUN: 12 mg/dL (ref 7–25)
CHLORIDE: 105 mmol/L (ref 98–110)
CO2: 27 mmol/L (ref 20–31)
Calcium: 9.2 mg/dL (ref 8.6–10.4)
Creat: 0.93 mg/dL (ref 0.60–0.93)
GFR, EST AFRICAN AMERICAN: 72 mL/min (ref >=60)
GFR, EST NON AFRICAN AMERICAN: 62 mL/min (ref >=60)
GLUCOSE: 95 mg/dL (ref 65–99)
POTASSIUM: 4 mmol/L (ref 3.5–5.3)
SODIUM: 139 mmol/L (ref 135–146)
Total Protein: 6.5 g/dL (ref 6.1–8.1)

## 2016-06-10 LAB — CBC WITH DIFFERENTIAL/PLATELET
Basophils Absolute: 59 cells/uL (ref 0–200)
Basophils Relative: 1 %
Eosinophils Absolute: 177 cells/uL (ref 15–500)
Eosinophils Relative: 3 %
HCT: 32 % — ABNORMAL LOW (ref 35.0–45.0)
Hemoglobin: 10.7 g/dL — ABNORMAL LOW (ref 11.7–15.5)
LYMPHS PCT: 19 %
Lymphs Abs: 1121 cells/uL (ref 850–3900)
MCH: 32.3 pg (ref 27.0–33.0)
MCHC: 33.4 g/dL (ref 32.0–36.0)
MCV: 96.7 fL (ref 80.0–100.0)
MPV: 9.3 fL (ref 7.5–12.5)
Monocytes Absolute: 472 cells/uL (ref 200–950)
Monocytes Relative: 8 %
Neutro Abs: 4071 cells/uL (ref 1500–7800)
Neutrophils Relative %: 69 %
PLATELETS: 270 10*3/uL (ref 140–400)
RBC: 3.31 MIL/uL — ABNORMAL LOW (ref 3.80–5.10)
RDW: 14.6 % (ref 11.0–15.0)
WBC: 5.9 10*3/uL (ref 3.8–10.8)

## 2016-06-11 ENCOUNTER — Telehealth: Payer: Self-pay | Admitting: Radiology

## 2016-06-11 ENCOUNTER — Encounter: Payer: Self-pay | Admitting: Gastroenterology

## 2016-06-11 MED ORDER — METHOTREXATE 2.5 MG PO TABS: 15.0000 mg | ORAL_TABLET | ORAL | 0 refills | Status: DC

## 2016-06-11 NOTE — Telephone Encounter (Signed)
I have called patient to advise labs are c/w previous 

## 2016-06-11 NOTE — Telephone Encounter (Signed)
03/02/2016 last visit Next visit 06/23/16 Labs resulted WNL Ok to refill MTX per Dr Estanislado Pandy

## 2016-06-11 NOTE — Telephone Encounter (Signed)
-----   Message from Eliezer Lofts, [place] sent at 06/11/2016  3:09 PM EDT ----- Send copy of labs to PCP And tell patient #1: CMP with GFR is normal  #2: CBC with differential is consistent with past labs with mild anemia. No change in the last 5 lab results since 03/29/2016 to 06/09/2016.

## 2016-06-17 DIAGNOSIS — M545 Low back pain, unspecified: Secondary | ICD-10-CM | POA: Insufficient documentation

## 2016-06-17 NOTE — Progress Notes (Addendum)
Office Visit Note  Patient: Sally Diaz             Date of Birth: [birth year]           MRN: [id number]             PCP: Phineas Inches, MD Referring: Bernerd Limbo, MD Visit Date: 06/23/2016 Occupation: _0 @    Subjective:  Pain of the Lower Back   History of Present Illness: JURNIE GARRITANO is a 71 y.o. female    Currently, patient is taking Plaquenil at 200 mg in the morning and 100 mg in the evening. We will discuss with the patient how to take the medication without cutting it in half. Namely, we will do 200 mg twice a day Monday through Friday only and skip Saturday and Sunday.  She is also taking methotrexate 6 pills every Friday. She is doing really well with the methotrexate. She is also taking folic acid 2 mg every day.  Overall patient's rheumatoid arthritis is very well controlled with the combination of Plaquenil and methotrexate. She has labs up-to-date and there were done March 2018. See epic for full details.   Right lower back pain x 5 - 6 days without radiation. Hx of L4-L5 disc space narrowing per patient.  Last Plaquenil eye exam was January 2018 at Dr. Velvet Bathe office and was normal according to the patient.  Activities of Daily Living:  Patient reports morning stiffness for 1 minutes.   Patient Denies nocturnal pain.  Difficulty dressing/grooming: Denies Difficulty climbing stairs: Denies Difficulty getting out of chair: Denies Difficulty using hands for taps, buttons, cutlery, and/or writing: Denies   Review of Systems  Constitutional: Negative for fatigue.  HENT: Negative for mouth sores and mouth dryness.   Eyes: Negative for dryness.  Respiratory: Negative for shortness of breath.   Gastrointestinal: Negative for constipation and diarrhea.  Musculoskeletal: Negative for myalgias and myalgias.  Skin: Negative for sensitivity to sunlight.  Psychiatric/Behavioral: Negative for decreased concentration and sleep disturbance.     PMFS History:  Patient Active Problem List   Diagnosis Date Noted  . Acute midline low back pain without sciatica 06/17/2016  . High risk medication use 03/02/2016  . DDD (degenerative disc disease), lumbar 03/02/2016  . 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 Osteoarthritis, knee 03/02/2016  . Acute bronchitis 07/17/2012  . 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 CARCINOMA, BASAL CELL 06/23/2007  . HYPOTHYROIDISM 06/23/2007  . HYPERCHOLESTEROLEMIA 06/23/2007  . HYPERTENSION 06/23/2007  . Seasonal and perennial allergic rhinitis 06/23/2007  . OBESITY, UNSPECIFIED 05/04/2007  . ESOPHAGITIS, REFLUX 06/04/2005  . DIVERTICULOSIS, COLON 06/04/2005    Past Medical History:  Diagnosis Date  . Allergy, unspecified not elsewhere classified   . Anemia, iron deficiency   . Basal cell carcinoma   . Diverticulosis of colon   . Hx of adenomatous colonic polyps   . Hypercholesterolemia   . Hypertension   . Hypothyroidism   . Obesity   . Reflux esophagitis     Family History  Problem Relation Age of Onset  . Colon cancer Cousin     Maternal Side  . Diabetes Maternal Grandmother   . Aneurysm Maternal Grandmother     brain  . Heart disease Mother   . Bladder Cancer Mother   . Stroke Father   . Pneumonia Father   . Lung cancer Brother   . AAA (abdominal aortic aneurysm) Brother    Past Surgical History:  Procedure Laterality Date  .  CATARACT EXTRACTION, BILATERAL    . CERVICAL CONE BIOPSY    . ENDOMETRIAL ABLATION    . TONSILLECTOMY     Social History   Social History Narrative  . No narrative on file     Objective: Vital Signs: BP 128/80   Pulse 78   Resp 16   Wt 194 lb (88 kg)   BMI 34.37 kg/m    Physical Exam  Constitutional: She is oriented to person, place, and time. She appears well-developed and well-nourished.  HENT:  Head: Normocephalic and atraumatic.  Eyes: EOM are normal. Pupils are equal, round, and reactive to light.   Cardiovascular: Normal rate, regular rhythm and normal heart sounds.  Exam reveals no gallop and no friction rub.   No murmur heard. Pulmonary/Chest: Effort normal and breath sounds normal. She has no wheezes. She has no rales.  Abdominal: Soft. Bowel sounds are normal. She exhibits no distension. There is no tenderness. There is no guarding. No hernia.  Musculoskeletal: Normal range of motion. She exhibits no edema, tenderness or deformity.  Lymphadenopathy:    She has no cervical adenopathy.  Neurological: She is alert and oriented to person, place, and time. Coordination normal.  Skin: Skin is warm and dry. Capillary refill takes less than 2 seconds. No rash noted.  Psychiatric: She has a normal mood and affect. Her behavior is normal.  Nursing note and vitals reviewed.    Musculoskeletal Exam:  Full range of motion of all joints Grip strength is equal and strong bilaterally Fibromyalgia tender points are all absent  CDAI Exam: CDAI Homunculus Exam:   Joint Counts:  CDAI Tender Joint count: 0 CDAI Swollen Joint count: 0  Global Assessments:  Patient Global Assessment: 0 Provider Global Assessment: 0  No synovitis on examination. Typically her right second MCP joint was problematic in the past (initially) but doing really well  Investigation: Findings:   PLQ Eye exam has been normal January 2018, normal at Dr. Velvet Bathe office per patient   Orders Only on 06/09/2016  Component Date Value Ref Range Status  . WBC 06/09/2016 5.9  3.8 - 10.8 K/uL Final  . RBC 06/09/2016 3.31* 3.80 - 5.10 MIL/uL Final  . Hemoglobin 06/09/2016 10.7* 11.7 - 15.5 g/dL Final  . HCT 06/09/2016 32.0* 35.0 - 45.0 % Final  . MCV 06/09/2016 96.7  80.0 - 100.0 fL Final  . MCH 06/09/2016 32.3  27.0 - 33.0 pg Final  . MCHC 06/09/2016 33.4  32.0 - 36.0 g/dL Final  . RDW 06/09/2016 14.6  11.0 - 15.0 % Final  . Platelets 06/09/2016 270  140 - 400 K/uL Final  . MPV 06/09/2016 9.3  7.5 - 12.5 fL Final   . Neutro Abs 06/09/2016 4071  1,500 - 7,800 cells/uL Final  . Lymphs Abs 06/09/2016 1121  850 - 3,900 cells/uL Final  . Monocytes Absolute 06/09/2016 472  200 - 950 cells/uL Final  . Eosinophils Absolute 06/09/2016 177  15 - 500 cells/uL Final  . Basophils Absolute 06/09/2016 59  0 - 200 cells/uL Final  . Neutrophils Relative % 06/09/2016 69  % Final  . Lymphocytes Relative 06/09/2016 19  % Final  . Monocytes Relative 06/09/2016 8  % Final  . Eosinophils Relative 06/09/2016 3  % Final  . Basophils Relative 06/09/2016 1  % Final  . Smear Review 06/09/2016 Criteria for review not met   Final  . Sodium 06/09/2016 139  135 - 146 mmol/L Final  . Potassium 06/09/2016 4.0  3.5 -  5.3 mmol/L Final  . Chloride 06/09/2016 105  98 - 110 mmol/L Final  . CO2 06/09/2016 27  20 - 31 mmol/L Final  . Glucose, Bld 06/09/2016 95  65 - 99 mg/dL Final  . BUN 06/09/2016 12  7 - 25 mg/dL Final  . Creat 06/09/2016 0.93  0.60 - 0.93 mg/dL Final   Comment:   For patients > or = 72 years of age: The upper reference limit for Creatinine is approximately 13% higher for people identified as African-American.     . Total Bilirubin 06/09/2016 0.7  0.2 - 1.2 mg/dL Final  . Alkaline Phosphatase 06/09/2016 59  33 - 130 U/L Final  . AST 06/09/2016 21  10 - 35 U/L Final  . ALT 06/09/2016 26  6 - 29 U/L Final  . Total Protein 06/09/2016 6.5  6.1 - 8.1 g/dL Final  . Albumin 06/09/2016 4.1  3.6 - 5.1 g/dL Final  . Calcium 06/09/2016 9.2  8.6 - 10.4 mg/dL Final  . GFR, Est African American 06/09/2016 72  >=60 mL/min Final  . GFR, Est Non African American 06/09/2016 62  >=60 mL/min Final  Orders Only on 05/26/2016  Component Date Value Ref Range Status  . WBC 05/26/2016 4.0  3.8 - 10.8 K/uL Final  . RBC 05/26/2016 3.33* 3.80 - 5.10 MIL/uL Final  . Hemoglobin 05/26/2016 10.7* 11.7 - 15.5 g/dL Final  . HCT 05/26/2016 32.3* 35.0 - 45.0 % Final  . MCV 05/26/2016 97.0  80.0 - 100.0 fL Final  . MCH 05/26/2016 32.1  27.0 -  33.0 pg Final  . MCHC 05/26/2016 33.1  32.0 - 36.0 g/dL Final  . RDW 05/26/2016 15.2* 11.0 - 15.0 % Final  . Platelets 05/26/2016 252  140 - 400 K/uL Final  . MPV 05/26/2016 9.0  7.5 - 12.5 fL Final  . Neutro Abs 05/26/2016 1880  1,500 - 7,800 cells/uL Final  . Lymphs Abs 05/26/2016 1400  850 - 3,900 cells/uL Final  . Monocytes Absolute 05/26/2016 440  200 - 950 cells/uL Final  . Eosinophils Absolute 05/26/2016 240  15 - 500 cells/uL Final  . Basophils Absolute 05/26/2016 40  0 - 200 cells/uL Final  . Neutrophils Relative % 05/26/2016 47  % Final  . Lymphocytes Relative 05/26/2016 35  % Final  . Monocytes Relative 05/26/2016 11  % Final  . Eosinophils Relative 05/26/2016 6  % Final  . Basophils Relative 05/26/2016 1  % Final  . Smear Review 05/26/2016 Criteria for review not met   Final  . Sodium 05/26/2016 141  135 - 146 mmol/L Final  . Potassium 05/26/2016 4.2  3.5 - 5.3 mmol/L Final  . Chloride 05/26/2016 106  98 - 110 mmol/L Final  . CO2 05/26/2016 26  20 - 31 mmol/L Final  . Glucose, Bld 05/26/2016 90  65 - 99 mg/dL Final  . BUN 05/26/2016 14  7 - 25 mg/dL Final  . Creat 05/26/2016 0.93  0.60 - 0.93 mg/dL Final   Comment:   For patients > or = 72 years of age: The upper reference limit for Creatinine is approximately 13% higher for people identified as African-American.     . Total Bilirubin 05/26/2016 0.6  0.2 - 1.2 mg/dL Final  . Alkaline Phosphatase 05/26/2016 54  33 - 130 U/L Final  . AST 05/26/2016 22  10 - 35 U/L Final  . ALT 05/26/2016 23  6 - 29 U/L Final  . Total Protein 05/26/2016 6.8  6.1 - 8.1  g/dL Final  . Albumin 05/26/2016 4.1  3.6 - 5.1 g/dL Final  . Calcium 05/26/2016 9.6  8.6 - 10.4 mg/dL Final  . GFR, Est African American 05/26/2016 72  >=60 mL/min Final  . GFR, Est Non African American 05/26/2016 62  >=60 mL/min Final  Orders Only on 05/12/2016  Component Date Value Ref Range Status  . WBC 05/12/2016 3.7* 3.8 - 10.8 K/uL Final  . RBC 05/12/2016 3.48*  3.80 - 5.10 MIL/uL Final  . Hemoglobin 05/12/2016 10.8* 11.7 - 15.5 g/dL Final  . HCT 05/12/2016 32.9* 35.0 - 45.0 % Final  . MCV 05/12/2016 94.5  80.0 - 100.0 fL Final  . MCH 05/12/2016 31.0  27.0 - 33.0 pg Final  . MCHC 05/12/2016 32.8  32.0 - 36.0 g/dL Final  . RDW 05/12/2016 16.2* 11.0 - 15.0 % Final  . Platelets 05/12/2016 268  140 - 400 K/uL Final  . MPV 05/12/2016 9.0  7.5 - 12.5 fL Final  . Neutro Abs 05/12/2016 1887  1,500 - 7,800 cells/uL Final  . Lymphs Abs 05/12/2016 1221  850 - 3,900 cells/uL Final  . Monocytes Absolute 05/12/2016 370  200 - 950 cells/uL Final  . Eosinophils Absolute 05/12/2016 185  15 - 500 cells/uL Final  . Basophils Absolute 05/12/2016 37  0 - 200 cells/uL Final  . Neutrophils Relative % 05/12/2016 51  % Final  . Lymphocytes Relative 05/12/2016 33  % Final  . Monocytes Relative 05/12/2016 10  % Final  . Eosinophils Relative 05/12/2016 5  % Final  . Basophils Relative 05/12/2016 1  % Final  . Smear Review 05/12/2016 Criteria for review not met   Final  . Sodium 05/12/2016 141  135 - 146 mmol/L Final  . Potassium 05/12/2016 4.4  3.5 - 5.3 mmol/L Final  . Chloride 05/12/2016 106  98 - 110 mmol/L Final  . CO2 05/12/2016 24  20 - 31 mmol/L Final  . Glucose, Bld 05/12/2016 88  65 - 99 mg/dL Final  . BUN 05/12/2016 18  7 - 25 mg/dL Final  . Creat 05/12/2016 1.08* 0.60 - 0.93 mg/dL Final   Comment:   For patients > or = 72 years of age: The upper reference limit for Creatinine is approximately 13% higher for people identified as African-American.     . Total Bilirubin 05/12/2016 0.6  0.2 - 1.2 mg/dL Final  . Alkaline Phosphatase 05/12/2016 57  33 - 130 U/L Final  . AST 05/12/2016 24  10 - 35 U/L Final  . ALT 05/12/2016 34* 6 - 29 U/L Final  . Total Protein 05/12/2016 6.9  6.1 - 8.1 g/dL Final  . Albumin 05/12/2016 4.3  3.6 - 5.1 g/dL Final  . Calcium 05/12/2016 9.8  8.6 - 10.4 mg/dL Final  . GFR, Est African American 05/12/2016 60  >=60 mL/min Final   . GFR, Est Non African American 05/12/2016 52* >=60 mL/min Final  Orders Only on 04/28/2016  Component Date Value Ref Range Status  . WBC 04/28/2016 5.1  3.8 - 10.8 K/uL Final  . RBC 04/28/2016 3.55* 3.80 - 5.10 MIL/uL Final  . Hemoglobin 04/28/2016 11.0* 11.7 - 15.5 g/dL Final  . HCT 04/28/2016 33.4* 35.0 - 45.0 % Final  . MCV 04/28/2016 94.1  80.0 - 100.0 fL Final  . MCH 04/28/2016 31.0  27.0 - 33.0 pg Final  . MCHC 04/28/2016 32.9  32.0 - 36.0 g/dL Final  . RDW 04/28/2016 16.5* 11.0 - 15.0 % Final  . Platelets 04/28/2016 283  140 - 400 K/uL Final  . MPV 04/28/2016 9.0  7.5 - 12.5 fL Final  . Neutro Abs 04/28/2016 2754  1,500 - 7,800 cells/uL Final  . Lymphs Abs 04/28/2016 1530  850 - 3,900 cells/uL Final  . Monocytes Absolute 04/28/2016 561  200 - 950 cells/uL Final  . Eosinophils Absolute 04/28/2016 204  15 - 500 cells/uL Final  . Basophils Absolute 04/28/2016 51  0 - 200 cells/uL Final  . Neutrophils Relative % 04/28/2016 54  % Final  . Lymphocytes Relative 04/28/2016 30  % Final  . Monocytes Relative 04/28/2016 11  % Final  . Eosinophils Relative 04/28/2016 4  % Final  . Basophils Relative 04/28/2016 1  % Final  . Smear Review 04/28/2016 Criteria for review not met   Final  . Sodium 04/28/2016 141  135 - 146 mmol/L Final  . Potassium 04/28/2016 4.2  3.5 - 5.3 mmol/L Final  . Chloride 04/28/2016 104  98 - 110 mmol/L Final  . CO2 04/28/2016 29  20 - 31 mmol/L Final  . Glucose, Bld 04/28/2016 94  65 - 99 mg/dL Final  . BUN 04/28/2016 22  7 - 25 mg/dL Final  . Creat 04/28/2016 1.29* 0.60 - 0.93 mg/dL Final   Comment:   For patients > or = 72 years of age: The upper reference limit for Creatinine is approximately 13% higher for people identified as African-American.     . Total Bilirubin 04/28/2016 0.8  0.2 - 1.2 mg/dL Final  . Alkaline Phosphatase 04/28/2016 58  33 - 130 U/L Final  . AST 04/28/2016 22  10 - 35 U/L Final  . ALT 04/28/2016 24  6 - 29 U/L Final  . Total  Protein 04/28/2016 7.0  6.1 - 8.1 g/dL Final  . Albumin 04/28/2016 4.4  3.6 - 5.1 g/dL Final  . Calcium 04/28/2016 10.0  8.6 - 10.4 mg/dL Final  . GFR, Est African American 04/28/2016 48* >=60 mL/min Final  . GFR, Est Non African American 04/28/2016 42* >=60 mL/min Final  Orders Only on 04/13/2016  Component Date Value Ref Range Status  . WBC 04/13/2016 4.1  3.8 - 10.8 K/uL Final  . RBC 04/13/2016 3.46* 3.80 - 5.10 MIL/uL Final  . Hemoglobin 04/13/2016 10.7* 11.7 - 15.5 g/dL Final  . HCT 04/13/2016 31.9* 35.0 - 45.0 % Final  . MCV 04/13/2016 92.2  80.0 - 100.0 fL Final  . MCH 04/13/2016 30.9  27.0 - 33.0 pg Final  . MCHC 04/13/2016 33.5  32.0 - 36.0 g/dL Final  . RDW 04/13/2016 15.7* 11.0 - 15.0 % Final  . Platelets 04/13/2016 268  140 - 400 K/uL Final  . MPV 04/13/2016 8.9  7.5 - 12.5 fL Final  . Neutro Abs 04/13/2016 1845  1,500 - 7,800 cells/uL Final  . Lymphs Abs 04/13/2016 1558  850 - 3,900 cells/uL Final  . Monocytes Absolute 04/13/2016 369  200 - 950 cells/uL Final  . Eosinophils Absolute 04/13/2016 287  15 - 500 cells/uL Final  . Basophils Absolute 04/13/2016 41  0 - 200 cells/uL Final  . Neutrophils Relative % 04/13/2016 45  % Final  . Lymphocytes Relative 04/13/2016 38  % Final  . Monocytes Relative 04/13/2016 9  % Final  . Eosinophils Relative 04/13/2016 7  % Final  . Basophils Relative 04/13/2016 1  % Final  . Smear Review 04/13/2016 Criteria for review not met   Final  . Sodium 04/13/2016 140  135 - 146 mmol/L Final  . Potassium 04/13/2016  4.1  3.5 - 5.3 mmol/L Final  . Chloride 04/13/2016 107  98 - 110 mmol/L Final  . CO2 04/13/2016 26  20 - 31 mmol/L Final  . Glucose, Bld 04/13/2016 87  65 - 99 mg/dL Final  . BUN 04/13/2016 12  7 - 25 mg/dL Final  . Creat 04/13/2016 0.97* 0.60 - 0.93 mg/dL Final   Comment:   For patients > or = 72 years of age: The upper reference limit for Creatinine is approximately 13% higher for people identified as African-American.     .  Total Bilirubin 04/13/2016 0.7  0.2 - 1.2 mg/dL Final  . Alkaline Phosphatase 04/13/2016 55  33 - 130 U/L Final  . AST 04/13/2016 31  10 - 35 U/L Final  . ALT 04/13/2016 40* 6 - 29 U/L Final  . Total Protein 04/13/2016 6.6  6.1 - 8.1 g/dL Final  . Albumin 04/13/2016 4.1  3.6 - 5.1 g/dL Final  . Calcium 04/13/2016 9.1  8.6 - 10.4 mg/dL Final  . GFR, Est African American 04/13/2016 68  >=60 mL/min Final  . GFR, Est Non African American 04/13/2016 59* >=60 mL/min Final  Orders Only on 03/29/2016  Component Date Value Ref Range Status  . WBC 03/29/2016 4.8  3.8 - 10.8 K/uL Final  . RBC 03/29/2016 3.60* 3.80 - 5.10 MIL/uL Final  . Hemoglobin 03/29/2016 11.1* 11.7 - 15.5 g/dL Final  . HCT 03/29/2016 32.9* 35.0 - 45.0 % Final  . MCV 03/29/2016 91.4  80.0 - 100.0 fL Final  . MCH 03/29/2016 30.8  27.0 - 33.0 pg Final  . MCHC 03/29/2016 33.7  32.0 - 36.0 g/dL Final  . RDW 03/29/2016 15.0  11.0 - 15.0 % Final  . Platelets 03/29/2016 314  140 - 400 K/uL Final  . MPV 03/29/2016 8.7  7.5 - 12.5 fL Final  . Neutro Abs 03/29/2016 2736  1,500 - 7,800 cells/uL Final  . Lymphs Abs 03/29/2016 1392  850 - 3,900 cells/uL Final  . Monocytes Absolute 03/29/2016 336  200 - 950 cells/uL Final  . Eosinophils Absolute 03/29/2016 288  15 - 500 cells/uL Final  . Basophils Absolute 03/29/2016 48  0 - 200 cells/uL Final  . Neutrophils Relative % 03/29/2016 57  % Final  . Lymphocytes Relative 03/29/2016 29  % Final  . Monocytes Relative 03/29/2016 7  % Final  . Eosinophils Relative 03/29/2016 6  % Final  . Basophils Relative 03/29/2016 1  % Final  . Smear Review 03/29/2016 Criteria for review not met   Final  . Sodium 03/29/2016 138  135 - 146 mmol/L Final  . Potassium 03/29/2016 4.1  3.5 - 5.3 mmol/L Final  . Chloride 03/29/2016 102  98 - 110 mmol/L Final  . CO2 03/29/2016 27  20 - 31 mmol/L Final  . Glucose, Bld 03/29/2016 92  65 - 99 mg/dL Final  . BUN 03/29/2016 21  7 - 25 mg/dL Final  . Creat 03/29/2016  1.13* 0.60 - 0.93 mg/dL Final   Comment:   For patients > or = 72 years of age: The upper reference limit for Creatinine is approximately 13% higher for people identified as African-American.     . Total Bilirubin 03/29/2016 0.6  0.2 - 1.2 mg/dL Final  . Alkaline Phosphatase 03/29/2016 62  33 - 130 U/L Final  . AST 03/29/2016 28  10 - 35 U/L Final  . ALT 03/29/2016 43* 6 - 29 U/L Final  . Total Protein 03/29/2016 7.1  6.1 -  8.1 g/dL Final  . Albumin 03/29/2016 4.1  3.6 - 5.1 g/dL Final  . Calcium 03/29/2016 9.5  8.6 - 10.4 mg/dL Final  . GFR, Est African American 03/29/2016 57* >=60 mL/min Final  . GFR, Est Non African American 03/29/2016 49* >=60 mL/min Final  Orders Only on 03/17/2016  Component Date Value Ref Range Status  . WBC 03/17/2016 6.5  3.8 - 10.8 K/uL Final  . RBC 03/17/2016 3.82  3.80 - 5.10 MIL/uL Final  . Hemoglobin 03/17/2016 11.4* 11.7 - 15.5 g/dL Final  . HCT 03/17/2016 34.6* 35.0 - 45.0 % Final  . MCV 03/17/2016 90.6  80.0 - 100.0 fL Final  . MCH 03/17/2016 29.8  27.0 - 33.0 pg Final  . MCHC 03/17/2016 32.9  32.0 - 36.0 g/dL Final  . RDW 03/17/2016 14.6  11.0 - 15.0 % Final  . Platelets 03/17/2016 262  140 - 400 K/uL Final  . MPV 03/17/2016 9.3  7.5 - 12.5 fL Final  . Neutro Abs 03/17/2016 4030  1,500 - 7,800 cells/uL Final  . Lymphs Abs 03/17/2016 1560  850 - 3,900 cells/uL Final  . Monocytes Absolute 03/17/2016 650  200 - 950 cells/uL Final  . Eosinophils Absolute 03/17/2016 260  15 - 500 cells/uL Final  . Basophils Absolute 03/17/2016 0  0 - 200 cells/uL Final  . Neutrophils Relative % 03/17/2016 62  % Final  . Lymphocytes Relative 03/17/2016 24  % Final  . Monocytes Relative 03/17/2016 10  % Final  . Eosinophils Relative 03/17/2016 4  % Final  . Basophils Relative 03/17/2016 0  % Final  . Smear Review 03/17/2016 Criteria for review not met   Final  . Sodium 03/17/2016 136  135 - 146 mmol/L Final  . Potassium 03/17/2016 4.2  3.5 - 5.3 mmol/L Final  .  Chloride 03/17/2016 106  98 - 110 mmol/L Final  . CO2 03/17/2016 23  20 - 31 mmol/L Final  . Glucose, Bld 03/17/2016 96  65 - 99 mg/dL Final  . BUN 03/17/2016 13  7 - 25 mg/dL Final  . Creat 03/17/2016 1.16* 0.60 - 0.93 mg/dL Final   Comment:   For patients > or = 72 years of age: The upper reference limit for Creatinine is approximately 13% higher for people identified as African-American.     . Total Bilirubin 03/17/2016 0.7  0.2 - 1.2 mg/dL Final  . Alkaline Phosphatase 03/17/2016 65  33 - 130 U/L Final  . AST 03/17/2016 23  10 - 35 U/L Final  . ALT 03/17/2016 20  6 - 29 U/L Final  . Total Protein 03/17/2016 6.8  6.1 - 8.1 g/dL Final  . Albumin 03/17/2016 4.1  3.6 - 5.1 g/dL Final  . Calcium 03/17/2016 9.6  8.6 - 10.4 mg/dL Final  . GFR, Est African American 03/17/2016 55* >=60 mL/min Final  . GFR, Est Non African American 03/17/2016 47* >=60 mL/min Final  Orders Only on 03/02/2016  Component Date Value Ref Range Status  . WBC 03/02/2016 4.4  3.8 - 10.8 K/uL Final  . RBC 03/02/2016 4.07  3.80 - 5.10 MIL/uL Final  . Hemoglobin 03/02/2016 12.2  11.7 - 15.5 g/dL Final  . HCT 03/02/2016 36.7  35.0 - 45.0 % Final  . MCV 03/02/2016 90.2  80.0 - 100.0 fL Final  . MCH 03/02/2016 30.0  27.0 - 33.0 pg Final  . MCHC 03/02/2016 33.2  32.0 - 36.0 g/dL Final  . RDW 03/02/2016 13.8  11.0 - 15.0 % Final  .  Platelets 03/02/2016 315  140 - 400 K/uL Final  . MPV 03/02/2016 9.0  7.5 - 12.5 fL Final  . Neutro Abs 03/02/2016 2332  1,500 - 7,800 cells/uL Final  . Lymphs Abs 03/02/2016 1364  850 - 3,900 cells/uL Final  . Monocytes Absolute 03/02/2016 396  200 - 950 cells/uL Final  . Eosinophils Absolute 03/02/2016 264  15 - 500 cells/uL Final  . Basophils Absolute 03/02/2016 44  0 - 200 cells/uL Final  . Neutrophils Relative % 03/02/2016 53  % Final  . Lymphocytes Relative 03/02/2016 31  % Final  . Monocytes Relative 03/02/2016 9  % Final  . Eosinophils Relative 03/02/2016 6  % Final  . Basophils  Relative 03/02/2016 1  % Final  . Smear Review 03/02/2016 Criteria for review not met   Final  . Sodium 03/02/2016 138  135 - 146 mmol/L Final  . Potassium 03/02/2016 3.7  3.5 - 5.3 mmol/L Final  . Chloride 03/02/2016 102  98 - 110 mmol/L Final  . CO2 03/02/2016 24  20 - 31 mmol/L Final  . Glucose, Bld 03/02/2016 101* 65 - 99 mg/dL Final  . BUN 03/02/2016 20  7 - 25 mg/dL Final  . Creat 03/02/2016 1.12* 0.60 - 0.93 mg/dL Final   Comment:   For patients > or = 73 years of age: The upper reference limit for Creatinine is approximately 13% higher for people identified as African-American.     . Total Bilirubin 03/02/2016 0.6  0.2 - 1.2 mg/dL Final  . Alkaline Phosphatase 03/02/2016 71  33 - 130 U/L Final  . AST 03/02/2016 33  10 - 35 U/L Final  . ALT 03/02/2016 36* 6 - 29 U/L Final  . Total Protein 03/02/2016 7.4  6.1 - 8.1 g/dL Final  . Albumin 03/02/2016 4.7  3.6 - 5.1 g/dL Final  . Calcium 03/02/2016 9.9  8.6 - 10.4 mg/dL Final  . GFR, Est African American 03/02/2016 57* >=60 mL/min Final  . GFR, Est Non African American 03/02/2016 50* >=60 mL/min Final  Orders Only on 02/18/2016  Component Date Value Ref Range Status  . WBC 02/18/2016 6.4  3.8 - 10.8 K/uL Final  . RBC 02/18/2016 3.83  3.80 - 5.10 MIL/uL Final  . Hemoglobin 02/18/2016 11.6* 11.7 - 15.5 g/dL Final  . HCT 02/18/2016 35.4  35.0 - 45.0 % Final  . MCV 02/18/2016 92.4  80.0 - 100.0 fL Final  . MCH 02/18/2016 30.3  27.0 - 33.0 pg Final  . MCHC 02/18/2016 32.8  32.0 - 36.0 g/dL Final  . RDW 02/18/2016 13.6  11.0 - 15.0 % Final  . Platelets 02/18/2016 270  140 - 400 K/uL Final  . MPV 02/18/2016 9.2  7.5 - 12.5 fL Final  . Neutro Abs 02/18/2016 3392  1,500 - 7,800 cells/uL Final  . Lymphs Abs 02/18/2016 1984  850 - 3,900 cells/uL Final  . Monocytes Absolute 02/18/2016 640  200 - 950 cells/uL Final  . Eosinophils Absolute 02/18/2016 320  15 - 500 cells/uL Final  . Basophils Absolute 02/18/2016 64  0 - 200 cells/uL Final   . Neutrophils Relative % 02/18/2016 53  % Final  . Lymphocytes Relative 02/18/2016 31  % Final  . Monocytes Relative 02/18/2016 10  % Final  . Eosinophils Relative 02/18/2016 5  % Final  . Basophils Relative 02/18/2016 1  % Final  . Smear Review 02/18/2016 Criteria for review not met   Final  . Sodium 02/18/2016 140  135 - 146  mmol/L Final  . Potassium 02/18/2016 4.5  3.5 - 5.3 mmol/L Final  . Chloride 02/18/2016 103  98 - 110 mmol/L Final  . CO2 02/18/2016 30  20 - 31 mmol/L Final  . Glucose, Bld 02/18/2016 76  65 - 99 mg/dL Final  . BUN 02/18/2016 16  7 - 25 mg/dL Final  . Creat 02/18/2016 1.08* 0.60 - 0.93 mg/dL Final   Comment:   For patients > or = 72 years of age: The upper reference limit for Creatinine is approximately 13% higher for people identified as African-American.     . Total Bilirubin 02/18/2016 0.4  0.2 - 1.2 mg/dL Final  . Alkaline Phosphatase 02/18/2016 66  33 - 130 U/L Final  . AST 02/18/2016 17  10 - 35 U/L Final  . ALT 02/18/2016 14  6 - 29 U/L Final  . Total Protein 02/18/2016 7.0  6.1 - 8.1 g/dL Final  . Albumin 02/18/2016 4.2  3.6 - 5.1 g/dL Final  . Calcium 02/18/2016 9.6  8.6 - 10.4 mg/dL Final  . GFR, Est African American 02/18/2016 60  >=60 mL/min Final  . GFR, Est Non African American 02/18/2016 52* >=60 mL/min Final  Office Visit on 02/03/2016  Component Date Value Ref Range Status  . WBC 02/03/2016 4.4  3.8 - 10.8 K/uL Final  . RBC 02/03/2016 3.94  3.80 - 5.10 MIL/uL Final  . Hemoglobin 02/03/2016 11.8  11.7 - 15.5 g/dL Final  . HCT 02/03/2016 35.9  35.0 - 45.0 % Final  . MCV 02/03/2016 91.1  80.0 - 100.0 fL Final  . MCH 02/03/2016 29.9  27.0 - 33.0 pg Final  . MCHC 02/03/2016 32.9  32.0 - 36.0 g/dL Final  . RDW 02/03/2016 13.6  11.0 - 15.0 % Final  . Platelets 02/03/2016 268  140 - 400 K/uL Final  . MPV 02/03/2016 8.9  7.5 - 12.5 fL Final  . Neutro Abs 02/03/2016 2376  1,500 - 7,800 cells/uL Final  . Lymphs Abs 02/03/2016 1188  850 - 3,900  cells/uL Final  . Monocytes Absolute 02/03/2016 528  200 - 950 cells/uL Final  . Eosinophils Absolute 02/03/2016 264  15 - 500 cells/uL Final  . Basophils Absolute 02/03/2016 44  0 - 200 cells/uL Final  . Neutrophils Relative % 02/03/2016 54  % Final  . Lymphocytes Relative 02/03/2016 27  % Final  . Monocytes Relative 02/03/2016 12  % Final  . Eosinophils Relative 02/03/2016 6  % Final  . Basophils Relative 02/03/2016 1  % Final  . Smear Review 02/03/2016 Criteria for review not met   Final  . Sodium 02/03/2016 140  135 - 146 mmol/L Final  . Potassium 02/03/2016 4.3  3.5 - 5.3 mmol/L Final  . Chloride 02/03/2016 106  98 - 110 mmol/L Final  . CO2 02/03/2016 26  20 - 31 mmol/L Final  . Glucose, Bld 02/03/2016 91  65 - 99 mg/dL Final  . BUN 02/03/2016 11  7 - 25 mg/dL Final  . Creat 02/03/2016 0.97* 0.60 - 0.93 mg/dL Final   Comment:   For patients > or = 72 years of age: The upper reference limit for Creatinine is approximately 13% higher for people identified as African-American.     . Total Bilirubin 02/03/2016 0.6  0.2 - 1.2 mg/dL Final  . Alkaline Phosphatase 02/03/2016 65  33 - 130 U/L Final  . AST 02/03/2016 18  10 - 35 U/L Final  . ALT 02/03/2016 14  6 - 29 U/L  Final  . Total Protein 02/03/2016 7.1  6.1 - 8.1 g/dL Final  . Albumin 02/03/2016 4.4  3.6 - 5.1 g/dL Final  . Calcium 02/03/2016 9.6  8.6 - 10.4 mg/dL Final  . GFR, Est African American 02/03/2016 68  >=60 mL/min Final  . GFR, Est Non African American 02/03/2016 59* >=60 mL/min Final  . Vit D, 25-Hydroxy 02/03/2016 47  30 - 100 ng/mL Final   Comment: Vitamin D Status           25-OH Vitamin D        Deficiency                <20 ng/mL        Insufficiency         20 - 29 ng/mL        Optimal             > or = 30 ng/mL   For 25-OH Vitamin D testing on patients on D2-supplementation and patients for whom quantitation of D2 and D3 fractions is required, the QuestAssureD 25-OH VIT D, (D2,D3), LC/MS/MS is recommended:  order code [id number] (patients > 2 yrs).   There may be more visits with results that are not included.      Imaging: No results found.  Speciality Comments: No specialty comments available.    Procedures:  No procedures performed Allergies: Latex; Neomycin-bacitracin zn-polymyx; and Tetracycline   Assessment / Plan:     Visit Diagnoses: 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[place]) - Rheumatoid Factor and positive CCP:  High risk medication use - MTX-2.58m 6 tablets per week (150mtotal)PLQ- 300 mg a day  DDD (degenerative disc disease), lumbar  Primary osteoarthritis of both hands  Primary osteoarthritis of both knees  Acute midline low back pain without sciatica   Plan: #1: Rheumatoid arthritis.; Rheumatoid factor and CCP positive Currently on methotrexate 6 per week and doing very well. Folic acid 2 mg every day Plaquenil 200 mg in the morning and 100 mg in the evening Since last visit, patient was started on methotrexate which was added to her Plaquenil. She is doing really well with her joints as a result of the dual therapy.  No joint pain stiffness and swelling As a result, patient was advised to do a trial of Plaquenil 200 mg daily. She will continue the methotrexate at 6 per week at this time.  #2: High-risk prescription. Methotrexate 6 per week; folic acid 2 per day; Plaquenil 200 mg in the morning and 100 in the evening  New prescription for Plaquenil will be 200 mg once a day and continue the methotrexate as it is  #3: CBC with differential and CMP with GFR every 3 months which will be done at the next office visit  #4: Right paraspinous lumbar back pain at L4-L5 level after "overdoing it" in my backyard. Appears to be musculoskeletal in nature and patient will use Voltaren gel.   Orders: No orders of the defined types were placed in this encounter.  Meds ordered this encounter  Medications  . diclofenac sodium (VOLTAREN) 1 % GEL     Sig: Apply 4 g topically 4 (four) times daily. Voltaren Gel 3 grams to 3 large joints upto TID 3 TUBES with 3 refills    Dispense:  3 Tube    Refill:  3    Voltaren Gel 3 grams to 3 large joints upto TID 3 TUBES with 3 refills    Order Specific Question:  Supervising Provider    Answer:   Bo Merino [[id number]]  . hydroxychloroquine (PLAQUENIL) 200 MG tablet    Sig: 200 mg Monday through Friday; none on Saturday or Sunday.    Dispense:  120 tablet    Refill:  1    Order Specific Question:   Supervising Provider    Answer:   Bo Merino [phone]    Face-to-face time spent with patient was 30 minutes. 50% of time was spent in counseling and coordination of care.  Follow-Up Instructions: Return in about 3 months (around 09/22/2016) for RA,plq 200 bid m-f; mtx 6/wk, folic 110m qd, right lumbar paraspinous muscle spasm.   NEliezer Lofts PA-C  Patient had no synovitis on my exam today. She has responded very well to methotrexate. We will continue combination therapy with methotrexate and Plaquenil. I examined and evaluated the patient with NEliezer LoftsPA. The plan of care was discussed as noted above.  SBo Merino MD Note - This record has been created using DEditor, commissioning  Chart creation errors have been sought, but may not always  have been located. Such creation errors do not reflect on  the standard of medical care.

## 2016-06-21 ENCOUNTER — Ambulatory Visit: Payer: Medicare Other | Admitting: Rheumatology

## 2016-06-23 ENCOUNTER — Ambulatory Visit (INDEPENDENT_AMBULATORY_CARE_PROVIDER_SITE_OTHER): Payer: Medicare Other | Admitting: Rheumatology

## 2016-06-23 ENCOUNTER — Encounter: Payer: Self-pay | Admitting: Rheumatology

## 2016-06-23 VITALS — BP 128/80 | HR 78 | Resp 16 | Wt 194.0 lb

## 2016-06-23 DIAGNOSIS — M19041 Primary osteoarthritis, right hand: Secondary | ICD-10-CM | POA: Diagnosis not present

## 2016-06-23 DIAGNOSIS — M545 Low back pain, unspecified: Secondary | ICD-10-CM

## 2016-06-23 DIAGNOSIS — M19042 Primary osteoarthritis, left hand: Secondary | ICD-10-CM | POA: Diagnosis not present

## 2016-06-23 DIAGNOSIS — Z79899 Other long term (current) drug therapy: Secondary | ICD-10-CM | POA: Diagnosis not present

## 2016-06-23 DIAGNOSIS — M170 Bilateral primary osteoarthritis of knee: Secondary | ICD-10-CM

## 2016-06-23 DIAGNOSIS — M5136 Other intervertebral disc degeneration, lumbar region: Secondary | ICD-10-CM

## 2016-06-23 DIAGNOSIS — M0579 Rheumatoid arthritis with rheumatoid factor of multiple sites without organ or systems involvement: Secondary | ICD-10-CM | POA: Diagnosis not present

## 2016-06-23 MED ORDER — DICLOFENAC SODIUM 1 % TD GEL: 4.0000 g | Freq: Four times a day (QID) | TRANSDERMAL | 3 refills | Status: AC

## 2016-06-23 MED ORDER — HYDROXYCHLOROQUINE SULFATE 200 MG PO TABS: ORAL_TABLET | ORAL | 1 refills | Status: DC

## 2016-06-23 NOTE — Patient Instructions (Signed)

## 2016-06-24 ENCOUNTER — Telehealth: Payer: Self-pay | Admitting: Rheumatology

## 2016-06-24 ENCOUNTER — Telehealth: Payer: Self-pay | Admitting: Pharmacist

## 2016-06-24 NOTE — Telephone Encounter (Signed)
Patient called this morning stating that [employer]) is sending a fax over to our office for a prior authorization for a prescription that was written for her yesterday.  The prior authorization #[id number].  She just wanted to let us know so that we can be on the look out for the fax.  CB#[phone].  Thank you

## 2016-06-24 NOTE — Telephone Encounter (Signed)
Received call from patient.  I informed her Voltaren gel was denied.  Discussed use of GoodRx Coupon card.  Patient voiced understanding.  She asked about adverse effects of Voltaren gel.  Counseled patient on purpose, proper use, and adverse effects of Voltaren gel.  Patient denies any further questions.    Elisabeth Most, Pharm.D., BCPS, CPP Clinical Pharmacist Pager: [phone] Phone: [phone] 06/24/2016 4:57 PM

## 2016-06-24 NOTE — Telephone Encounter (Signed)
Submitted a prior authorization through Cover My Meds. Received a N/A response. Called [employer] to clarify the response. Spoke with [place], a representative who states that the authorization has been denied. She will fax a copy of the letter to the office.  Reference number: [id number]

## 2016-06-24 NOTE — Telephone Encounter (Signed)
Patient was seen 06/23/16.  Prescription sent for Plaquenil 200 mg Monday through Friday none on Saturday and Sunday, quantity 120.  Per your note, "New prescription for Plaquenil will be 200 mg once a day."   Please clarify directions on Plaquenil prescription.

## 2016-06-25 ENCOUNTER — Telehealth: Payer: Self-pay | Admitting: *Deleted

## 2016-06-25 NOTE — Telephone Encounter (Signed)
Patient may not tolerate once a day dosing and as a result I offered her the previous prescription to make sure she has adequate supply.She will do a trial of once a day dosing and if it works well, the next prescription will reflect the successful dose

## 2016-06-25 NOTE — Telephone Encounter (Signed)
Received fax from pharmacy to clarify dosage for patient's PLQ. Advised the pharmacist patient should be taking PLQ 1 tablet BID Monday-Friday, skip Saturday and Sunday.

## 2016-07-05 ENCOUNTER — Telehealth: Payer: Self-pay

## 2016-07-05 NOTE — Telephone Encounter (Addendum)
Received a prior authorization request from [facility] for generic Voltaren Gel. An authorization was submitted though cover my meds. The response was "unknown." Spoke with Alma Friendly, a representative from [employer], who states that the authorization has been denied.   Spoke with Nira Conn from [facility] to let her know of the outcome. She states that they may not be able to accept the goodrx coupon due to the cost of the medication but she could offer a store discount to patient. She will reach out to patient to let her know.   Spoke to Ms. Bognar who was already aware. She denies any questions regarding her medication at this time and voiced understanding.  Dabney Schanz, [place], CPhT 10:14 AM

## 2016-07-08 ENCOUNTER — Telehealth: Payer: Self-pay | Admitting: *Deleted

## 2016-07-08 NOTE — Telephone Encounter (Signed)
Please call to check on patient on Monday.

## 2016-07-08 NOTE — Telephone Encounter (Signed)
Patient stopped by the office today. Patient states she has a rash that she believes may possibly be shingles. Patient states she was seen in urgent care on 07/06/16 and they performed a biopsy. Patient states the rash is underneath her breast on her abdomen and on her neck. Patient states the rash in painful. Advised patient to be seen by her PCP or return to the urgent care for further evaluation and treatment.

## 2016-07-15 ENCOUNTER — Encounter (HOSPITAL_COMMUNITY): Payer: Self-pay | Admitting: Emergency Medicine

## 2016-07-15 ENCOUNTER — Emergency Department (HOSPITAL_COMMUNITY)
Admission: EM | Admit: 2016-07-15 | Discharge: 2016-07-15 | Disposition: A | Discharge status: Home / Self Care | Payer: Medicare Other | Attending: Emergency Medicine | Admitting: Emergency Medicine

## 2016-07-15 DIAGNOSIS — Z79899 Other long term (current) drug therapy: Secondary | ICD-10-CM | POA: Diagnosis not present

## 2016-07-15 DIAGNOSIS — Z91040 Latex allergy status: Secondary | ICD-10-CM | POA: Diagnosis not present

## 2016-07-15 DIAGNOSIS — E876 Hypokalemia: Secondary | ICD-10-CM | POA: Diagnosis not present

## 2016-07-15 DIAGNOSIS — R55 Syncope and collapse: Secondary | ICD-10-CM | POA: Insufficient documentation

## 2016-07-15 DIAGNOSIS — E039 Hypothyroidism, unspecified: Secondary | ICD-10-CM | POA: Insufficient documentation

## 2016-07-15 DIAGNOSIS — I10 Essential (primary) hypertension: Secondary | ICD-10-CM | POA: Insufficient documentation

## 2016-07-15 LAB — URINALYSIS, ROUTINE W REFLEX MICROSCOPIC
BILIRUBIN URINE: NEGATIVE
Glucose, UA: NEGATIVE mg/dL
KETONES UR: NEGATIVE mg/dL
Nitrite: NEGATIVE
PROTEIN: NEGATIVE mg/dL
SPECIFIC GRAVITY, URINE: 1.011 (ref 1.005–1.030)
pH: 5 (ref 5.0–8.0)

## 2016-07-15 LAB — CBC
HEMATOCRIT: 34.7 % — AB (ref 36.0–46.0)
HEMOGLOBIN: 11.6 g/dL — AB (ref 12.0–15.0)
MCH: 31.8 pg (ref 26.0–34.0)
MCHC: 33.4 g/dL (ref 30.0–36.0)
MCV: 95.1 fL (ref 78.0–100.0)
Platelets: 267 10*3/uL (ref 150–400)
RBC: 3.65 MIL/uL — AB (ref 3.87–5.11)
RDW: 14 % (ref 11.5–15.5)
WBC: 8.8 10*3/uL (ref 4.0–10.5)

## 2016-07-15 LAB — BASIC METABOLIC PANEL
Anion gap: 9 (ref 5–15)
BUN: 27 mg/dL — ABNORMAL HIGH (ref 6–20)
CHLORIDE: 98 mmol/L — AB (ref 101–111)
CO2: 29 mmol/L (ref 22–32)
Calcium: 9.5 mg/dL (ref 8.9–10.3)
Creatinine, Ser: 1.3 mg/dL — ABNORMAL HIGH (ref 0.44–1.00)
GFR calc Af Amer: 47 mL/min — ABNORMAL LOW (ref >60)
GFR calc non Af Amer: 40 mL/min — ABNORMAL LOW (ref >60)
Glucose, Bld: 110 mg/dL — ABNORMAL HIGH (ref 65–99)
POTASSIUM: 3.3 mmol/L — AB (ref 3.5–5.1)
SODIUM: 136 mmol/L (ref 135–145)

## 2016-07-15 LAB — I-STAT TROPONIN, ED: Troponin i, poc: 0 ng/mL (ref 0.00–0.08)

## 2016-07-15 LAB — CBG MONITORING, ED: GLUCOSE-CAPILLARY: 100 mg/dL — AB (ref 65–99)

## 2016-07-15 MED ORDER — MAGNESIUM OXIDE 400 (241.3 MG) MG PO TABS
800.0000 mg | ORAL_TABLET | Freq: Once | ORAL | Status: AC
  Administered 2016-07-15: 800 mg via ORAL
  Filled 2016-07-15: qty 2

## 2016-07-15 MED ORDER — POTASSIUM CHLORIDE CRYS ER 20 MEQ PO TBCR
40.0000 meq | EXTENDED_RELEASE_TABLET | Freq: Once | ORAL | Status: AC
  Administered 2016-07-15: 40 meq via ORAL
  Filled 2016-07-15: qty 2

## 2016-07-15 MED ORDER — SODIUM CHLORIDE 0.9 % IV BOLUS (SEPSIS)
1000.0000 mL | Freq: Once | INTRAVENOUS | Status: AC
  Administered 2016-07-15: 1000 mL via INTRAVENOUS

## 2016-07-15 NOTE — ED Provider Notes (Signed)
[facility] DEPT Provider Note   CSN: [id number] Arrival date & time: 07/15/16  [place]     History   Chief Complaint Chief Complaint  Patient presents with  . Loss of Consciousness    HPI Sally Diaz is a 71 y.o. female.  71 yo F with a cc of a syncopal event. Patient had been feeling bad today. She is unable to describe it much further. Has felt fatigued and weak. She was out with a friend today shopping. She suddenly felt like she had to go to the bathroom. She had a loose stool and then when she stood up felt like she is to pass out. She walks to a bench and then sat down and then woke up with people crowded around her. Denied chest pain shortness breath abdominal pain headache or neck pain.   The history is provided by the patient.  Illness  This is a new problem. The current episode started less than 1 hour ago. The problem occurs constantly. The problem has not changed since onset.Pertinent negatives include no chest pain, no headaches and no shortness of breath. Nothing aggravates the symptoms. Nothing relieves the symptoms. She has tried nothing for the symptoms. The treatment provided no relief.    Past Medical History:  Diagnosis Date  . Allergy, unspecified not elsewhere classified   . Anemia, iron deficiency   . Basal cell carcinoma   . Diverticulosis of colon   . Hx of adenomatous colonic polyps   . Hypercholesterolemia   . Hypertension   . Hypothyroidism   . Obesity   . Reflux esophagitis     Patient Active Problem List   Diagnosis Date Noted  . Acute midline low back pain without sciatica 06/17/2016  . High risk medication use 03/02/2016  . DDD (degenerative disc disease), lumbar 03/02/2016  . 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 Osteoarthritis, knee 03/02/2016  . Acute bronchitis 07/17/2012  . 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[place]) 11/11/2008  . CARCINOMA, BASAL CELL 06/23/2007  . HYPOTHYROIDISM 06/23/2007  .  HYPERCHOLESTEROLEMIA 06/23/2007  . HYPERTENSION 06/23/2007  . Seasonal and perennial allergic rhinitis 06/23/2007  . OBESITY, UNSPECIFIED 05/04/2007  . ESOPHAGITIS, REFLUX 06/04/2005  . DIVERTICULOSIS, COLON 06/04/2005    Past Surgical History:  Procedure Laterality Date  . CATARACT EXTRACTION, BILATERAL    . CERVICAL CONE BIOPSY    . ENDOMETRIAL ABLATION    . TONSILLECTOMY      OB History    No data available       Home Medications    Prior to Admission medications   Medication Sig Start Date End Date Taking? Authorizing Provider  albuterol (PROVENTIL HFA;VENTOLIN HFA) 108 (90 BASE) MCG/ACT inhaler Use 2 puffs every 4-6 hours as needed 07/02/14   Deneise Lever, MD  calcium gluconate 500 MG tablet Take 1 tablet by mouth 2 (two) times daily.    Historical Provider, MD  chlorthalidone (HYGROTON) 25 MG tablet Take 25 mg by mouth as needed.  02/09/16   Historical Provider, MD  Cholecalciferol (VITAMIN D3) 5000 units TBDP Take 1 tablet by mouth daily.    Historical Provider, MD  Co-Enzyme Q-10 30 MG CAPS Take 100 mg by mouth.    Historical Provider, MD  diclofenac sodium (VOLTAREN) 1 % GEL Apply 4 g topically 4 (four) times daily. Voltaren Gel 3 grams to 3 large joints upto TID 3 TUBES with 3 refills 06/23/16 12/20/16  Naitik Panwala, PA-C  fish oil-omega-3 fatty acids 1000 MG capsule Take 2  capsules by mouth daily. 1 daily    Historical Provider, MD  folic acid (FOLVITE) 1 MG tablet Take 2 tablets (2 mg total) by mouth daily. 02/03/16 04/28/17  Naitik Panwala, PA-C  Glutathione 50 MG TABS Take by mouth.    Historical Provider, MD  hydroxychloroquine (PLAQUENIL) 200 MG tablet 200 mg Monday through Friday; none on Saturday or Sunday. 06/23/16   Naitik Panwala, PA-C  irbesartan (AVAPRO) 300 MG tablet Take 150 mg by mouth daily.      Historical Provider, MD  LIOTHYRONINE SODIUM PO Take 1 tablet by mouth daily. T4, T3 compounded medication    Historical Provider, MD  methotrexate (RHEUMATREX)  2.5 MG tablet Take 6 tablets (15 mg total) by mouth once a week. Caution:Chemotherapy. Protect from light. 06/11/16 08/28/16  Bo Merino, MD  Misc Natural Products (TART CHERRY ADVANCED PO) Take 1 tablet by mouth 2 (two) times daily.     Historical Provider, MD  NON FORMULARY Progesterone compound 25mg  take 1 tablet daily    Historical Provider, MD  Nutritional Supplements (DHEA) 15-50 MG CAPS Take 15 mg by mouth daily.    Historical Provider, MD  Pregnenolone POWD Take 2 mg by mouth daily.    Historical Provider, MD  Probiotic Product (PROBIOTIC-10 PO) Take by mouth.    Historical Provider, MD  ranitidine (ZANTAC 75) 75 MG tablet Take 1 tablet (75 mg total) by mouth 2 (two) times daily. 02/11/16   Mauri Pole, MD  tiZANidine (ZANAFLEX) 4 MG tablet Every 8 hours as needed for muscle spasms for up to 10 days then reduce dose to daily as needed. Patient not taking: Reported on 06/23/2016 03/02/16   Bo Merino, MD  vitamin E 400 UNIT capsule Take by mouth.    Historical Provider, MD    Family History Family History  Problem Relation Age of Onset  . Colon cancer Cousin     Maternal Side  . Diabetes Maternal Grandmother   . Aneurysm Maternal Grandmother     brain  . Heart disease Mother   . Bladder Cancer Mother   . Stroke Father   . Pneumonia Father   . Lung cancer Brother   . AAA (abdominal aortic aneurysm) Brother     Social History Social History  Substance Use Topics  . Smoking status: Never Smoker  . Smokeless tobacco: Never Used  . Alcohol use No     Allergies   Latex; Neomycin-bacitracin zn-polymyx; and Tetracycline   Review of Systems Review of Systems  Constitutional: Negative for chills and fever.  HENT: Negative for congestion and rhinorrhea.   Eyes: Negative for redness and visual disturbance.  Respiratory: Negative for shortness of breath and wheezing.   Cardiovascular: Negative for chest pain and palpitations.  Gastrointestinal: Negative for  nausea and vomiting.  Genitourinary: Negative for dysuria and urgency.  Musculoskeletal: Negative for arthralgias and myalgias.  Skin: Negative for pallor and wound.  Neurological: Positive for syncope and weakness. Negative for dizziness and headaches.     Physical Exam Updated Vital Signs BP (!) 145/66 (BP Location: Right Arm)   Pulse 74   Temp 97.9 F (36.6 C) (Oral)   Resp (!) 21   Ht 5' 2.5" (1.588 m)   Wt 186 lb (84.4 kg)   SpO2 94%   BMI 33.48 kg/m   Physical Exam  Constitutional: She is oriented to person, place, and time. She appears well-developed and well-nourished. No distress.  HENT:  Head: Normocephalic and atraumatic.  Eyes: EOM are normal.  Pupils are equal, round, and reactive to light.  Neck: Normal range of motion. Neck supple.  Cardiovascular: Normal rate and regular rhythm.  Exam reveals no gallop and no friction rub.   No murmur heard. Pulmonary/Chest: Effort normal. She has no wheezes. She has no rales.  Abdominal: Soft. She exhibits no distension and no mass. There is no tenderness. There is no guarding.  Musculoskeletal: She exhibits no edema or tenderness.  Neurological: She is alert and oriented to person, place, and time.  Skin: Skin is warm and dry. She is not diaphoretic.  Psychiatric: She has a normal mood and affect. Her behavior is normal.  Nursing note and vitals reviewed.    ED Treatments / Results  Labs (all labs ordered are listed, but only abnormal results are displayed) Labs Reviewed  BASIC METABOLIC PANEL - Abnormal; Notable for the following:       Result Value   Potassium 3.3 (*)    Chloride 98 (*)    Glucose, Bld 110 (*)    BUN 27 (*)    Creatinine, Ser 1.30 (*)    GFR calc non Af Amer 40 (*)    GFR calc Af Amer 47 (*)    All other components within normal limits  CBC - Abnormal; Notable for the following:    RBC 3.65 (*)    Hemoglobin 11.6 (*)    HCT 34.7 (*)    All other components within normal limits  URINALYSIS,  ROUTINE W REFLEX MICROSCOPIC - Abnormal; Notable for the following:    Hgb urine dipstick MODERATE (*)    Leukocytes, UA SMALL (*)    Bacteria, UA RARE (*)    Squamous Epithelial / LPF 0-5 (*)    All other components within normal limits  CBG MONITORING, ED - Abnormal; Notable for the following:    Glucose-Capillary 100 (*)    All other components within normal limits  I-STAT TROPOININ, ED    EKG  EKG Interpretation  Date/Time:  Thursday July 15 2016 19:10:41 EDT Ventricular Rate:  72 PR Interval:    QRS Duration: 103 QT Interval:  387 QTC Calculation: 424 R Axis:   -14 Text Interpretation:  Sinus rhythm Abnormal R-wave progression, early transition Left ventricular hypertrophy no wpw, prolonged qt or brugada No old tracing to compare Confirmed by Nena Hampe MD, DANIEL ([id number]) on 07/15/2016 7:24:27 PM       Radiology No results found.  Procedures Procedures (including critical care time)  Medications Ordered in ED Medications  potassium chloride SA (K-DUR,KLOR-CON) CR tablet 40 mEq (not administered)  magnesium oxide (MAG-OX) tablet 800 mg (not administered)  sodium chloride 0.9 % bolus 1,000 mL (1,000 mLs Intravenous New Bag/Given 07/15/16 1954)     Initial Impression / Assessment and Plan / ED Course  I have reviewed the triage vital signs and the nursing notes.  Pertinent labs & imaging results that were available during my care of the patient were reviewed by me and considered in my medical decision making (see chart for details).     71 yo F With a chief complaint of a syncopal event. Likely vasovagal as she had a bowel movement just prior to the event. EKG with no significant finding. A screening troponin was ordered as the patient has been weak throughout the day. I do not feel that delta is necessary. Will obtain basic labs with generalized weakness.  Lab workup is unremarkable. Patient doing better after later fluids. Discharge home.  9:32 PM:  I have  discussed  the diagnosis/risks/treatment options with the patient and family and believe the pt to be eligible for discharge home to follow-up with PCP. We also discussed returning to the ED immediately if new or worsening sx occur. We discussed the sx which are most concerning (e.g., sudden worsening pain, fever, inability to tolerate by mouth) that necessitate immediate return. Medications administered to the patient during their visit and any new prescriptions provided to the patient are listed below.  Medications given during this visit Medications  potassium chloride SA (K-DUR,KLOR-CON) CR tablet 40 mEq (not administered)  magnesium oxide (MAG-OX) tablet 800 mg (not administered)  sodium chloride 0.9 % bolus 1,000 mL (1,000 mLs Intravenous New Bag/Given 07/15/16 1954)     The patient appears reasonably screen and/or stabilized for discharge and I doubt any other medical condition or other [facility] requiring further screening, evaluation, or treatment in the ED at this time prior to discharge.      Final Clinical Impressions(s) / ED Diagnoses   Final diagnoses:  Syncope and collapse  Hypokalemia    New Prescriptions New Prescriptions   No medications on file     Deno Etienne, DO 07/15/16 2132

## 2016-07-15 NOTE — ED Notes (Signed)
Bed: NI77 Expected date:  Expected time:  Means of arrival:  Comments: 71 yo f lethargic, syncope

## 2016-07-15 NOTE — ED Notes (Addendum)
Pt ambulated to bathroom with no difficulty 

## 2016-07-15 NOTE — ED Triage Notes (Addendum)
Pt felt lethargic beginning this morning. She took her bp at home and was mildly lethargic. Pt went shopping with a friend and had 1 episode of diarrhea. Pt felt diaphoretic and light headed.Pt sat down and then had an episode of syncope. Pt denies complaints at this time.   Pt also is being treated for a rash that she is being treated for which she is on prednisone at home

## 2016-07-16 ENCOUNTER — Telehealth: Payer: Self-pay | Admitting: Rheumatology

## 2016-07-16 NOTE — Telephone Encounter (Signed)
Advised per Mr Tanda Rockers two bid M-F  She has voiced understanding

## 2016-07-16 NOTE — Telephone Encounter (Signed)
I spoke to patient. She states she had a rash with itching last week. She did see dermatology was told unsure what was going on with her, they did biopsy the area.   She thinks rash was from MTX States yesterday she had episode where she felt very dizzy/she passed out while shopping   She is a very poor historian/ ER notes state vasovagal , she had just had a bowel movement   Patient states she feels like this is from MTX  I have advise her to d/c the MTX and come in to discuss other options.

## 2016-07-16 NOTE — Telephone Encounter (Signed)
She will continue with the PLQ

## 2016-07-16 NOTE — Telephone Encounter (Signed)
Patient thinks shes having several reactions to the MTX. Patient passed out while she was shopping yesterday. Went to hospital yesterday due to that. Please call back, and advise. Due to take MTX tonight. Patient took Prednisone dose pack this week due to rash. Patient stopped Plaquenil because of Prednisone. Does she start that again?

## 2016-07-16 NOTE — Telephone Encounter (Signed)
#  1: Okay to stop methotrexate for now#2: Advised the patient to take Plaquenil 200 mg twice a day Monday through Friday only(Because we added methotrexate at the last visit, we advised her to do PLQ once a day so we need to change her back to Plaquenil 200 mg twice a day Monday through Friday)  -------------------------------------------  Review of her chart  FROM April 2018 VISIT shows ===> #1: Rheumatoid arthritis.; Rheumatoid factor and CCP positive Currently on methotrexate 6 per week and doing very well. Folic acid 2 mg every day Plaquenil 200 mg in the morning and 100 mg in the evening Since last visit, patient was started on methotrexate which was added to her Plaquenil. She is doing really well with her joints as a result of the dual therapy.  No joint pain stiffness and swelling As a result, patient was advised to do a trial of Plaquenil 200 mg daily.

## 2016-08-05 NOTE — Progress Notes (Signed)
Office Visit Note  Patient: Sally Diaz             Date of Birth: 24-Mar-1945           MRN: [id number]             PCP: Bernerd Limbo, MD Referring: Bernerd Limbo, MD Visit Date: 08/17/2016 Occupation: @GUAROCC @    Subjective:  Medication Management (d/ c MTX due to rash using PLQ bid M-F Eye exam Jan 2018 per pt )   History of Present Illness: Sally Diaz is a 71 y.o. female with history of sero positive rheumatoid arthritis. She started taking methotrexate along with Plaquenil in December 2017. She states in April she developed a rash and it to spread gradually all over her torso. She stopped the medication. She went to urgent care after that and was told that she might have shingles. She was using topical clobetasol. She went to see Dr. Coletta Memos who felt that it was a drug reaction and prescribed her prednisone. It gradually got better. She does describes the rash to be pruritic. She finished the prednisone on 07/07/2016. She says currently she is doing quite well just on Plaquenil. She's been taking Plaquenil 200 mg by mouth twice a day Monday to Friday. She's been experiencing an area of numbness on the lateral aspect of her left foot. She denies any lower back pain.  Activities of Daily Living:  Patient reports morning stiffness for 0 minute.   Patient Denies nocturnal pain.  Difficulty dressing/grooming: Denies Difficulty climbing stairs: Denies Difficulty getting out of chair: Denies Difficulty using hands for taps, buttons, cutlery, and/or writing: Denies   Review of Systems  Constitutional: Negative for fatigue, night sweats, weight gain, weight loss and weakness.  HENT: Negative for mouth sores, trouble swallowing, trouble swallowing, mouth dryness and nose dryness.   Eyes: Positive for dryness. Negative for pain, redness and visual disturbance.  Respiratory: Negative for cough, shortness of breath and difficulty breathing.   Cardiovascular: Negative for chest  pain, palpitations, hypertension, irregular heartbeat and swelling in legs/feet.  Gastrointestinal: Negative for blood in stool, constipation and diarrhea.  Endocrine: Negative for increased urination.  Genitourinary: Negative for vaginal dryness.  Musculoskeletal: Positive for arthralgias and joint pain. Negative for joint swelling, myalgias, muscle weakness, morning stiffness, muscle tenderness and myalgias.  Skin: Negative for color change, rash, hair loss, skin tightness, ulcers and sensitivity to sunlight.  Allergic/Immunologic: Negative for susceptible to infections.  Neurological: Negative for dizziness, memory loss and night sweats.  Hematological: Negative for swollen glands.  Psychiatric/Behavioral: Negative for depressed mood and sleep disturbance. The patient is not nervous/anxious.     PMFS History:  Patient Active Problem List   Diagnosis Date Noted  . Acute midline low back pain without sciatica 06/17/2016  . High risk medication use 03/02/2016  . DDD (degenerative disc disease), lumbar 03/02/2016  . 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 Osteoarthritis, knee 03/02/2016  . Acute bronchitis 07/17/2012  . 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[place]) 11/11/2008  . History of basal cell carcinoma 06/23/2007  . HYPOTHYROIDISM 06/23/2007  . HYPERCHOLESTEROLEMIA 06/23/2007  . HYPERTENSION 06/23/2007  . Seasonal and perennial allergic rhinitis 06/23/2007  . OBESITY, UNSPECIFIED 05/04/2007  . ESOPHAGITIS, REFLUX 06/04/2005  . DIVERTICULOSIS, COLON 06/04/2005    Past Medical History:  Diagnosis Date  . Allergy, unspecified not elsewhere classified   . Anemia, iron deficiency   . Basal cell carcinoma   . Diverticulosis of colon   . Hx of  adenomatous colonic polyps   . Hypercholesterolemia   . Hypertension   . Hypothyroidism   . Obesity   . Reflux esophagitis     Family History  Problem Relation Age of Onset  . Colon cancer Cousin        Maternal Side  .  Diabetes Maternal Grandmother   . Aneurysm Maternal Grandmother        brain  . Heart disease Mother   . Bladder Cancer Mother   . Stroke Father   . Pneumonia Father   . Lung cancer Brother   . AAA (abdominal aortic aneurysm) Brother    Past Surgical History:  Procedure Laterality Date  . CATARACT EXTRACTION, BILATERAL    . CERVICAL CONE BIOPSY    . ENDOMETRIAL ABLATION    . TONSILLECTOMY     Social History   Social History Narrative  . No narrative on file     Objective: Vital Signs: BP (!) 148/78   Pulse 78   Resp 16   Ht 5\' 3"  (1.6 m)   Wt 190 lb (86.2 kg)   BMI 33.66 kg/m    Physical Exam  Constitutional: She is oriented to person, place, and time. She appears well-developed and well-nourished.  HENT:  Head: Normocephalic and atraumatic.  Eyes: Conjunctivae and EOM are normal.  Neck: Normal range of motion.  Cardiovascular: Normal rate, regular rhythm, normal heart sounds and intact distal pulses.   Pulmonary/Chest: Effort normal and breath sounds normal.  Abdominal: Soft. Bowel sounds are normal.  Lymphadenopathy:    She has no cervical adenopathy.  Neurological: She is alert and oriented to person, place, and time.  Skin: Skin is warm and dry. Capillary refill takes less than 2 seconds.  Psychiatric: She has a normal mood and affect. Her behavior is normal.  Nursing note and vitals reviewed.    Musculoskeletal Exam: C-spine and thoracic lumbar spine good range of motion. She is some thoracic kyphosis. Shoulder joints elbow joints wrist joint MCPs PIPs DIPs with good range of motion with no synovitis. Hip joints knee joints ankles MTPs PIPs DIPs with good range of motion with no synovitis today.  CDAI Exam: CDAI Homunculus Exam:   Joint Counts:  CDAI Tender Joint count: 0 CDAI Swollen Joint count: 0  Global Assessments:  Patient Global Assessment: 3 Provider Global Assessment: 2  CDAI Calculated Score: 5    Investigation: Findings:  03/27/2015  normal PLQ eye exam 07/06/2016 CBC WBC 5.1 hemoglobin 11.8 platelets 262, CMP creatinine 1.2  CBC Latest Ref Rng & Units 07/15/2016 06/09/2016 05/26/2016  WBC 4.0 - 10.5 K/uL 8.8 5.9 4.0  Hemoglobin 12.0 - 15.0 g/dL 11.6(L) 10.7(L) 10.7(L)  Hematocrit 36.0 - 46.0 % 34.7(L) 32.0(L) 32.3(L)  Platelets 150 - 400 K/uL 267 270 252   CMP Latest Ref Rng & Units 07/15/2016 06/09/2016 05/26/2016  Glucose 65 - 99 mg/dL 110(H) 95 90  BUN 6 - 20 mg/dL 27(H) 12 14  Creatinine 0.44 - 1.00 mg/dL 1.30(H) 0.93 0.93  Sodium 135 - 145 mmol/L 136 139 141  Potassium 3.5 - 5.1 mmol/L 3.3(L) 4.0 4.2  Chloride 101 - 111 mmol/L 98(L) 105 106  CO2 22 - 32 mmol/L 29 27 26   Calcium 8.9 - 10.3 mg/dL 9.5 9.2 9.6  Total Protein 6.1 - 8.1 g/dL - 6.5 6.8  Total Bilirubin 0.2 - 1.2 mg/dL - 0.7 0.6  Alkaline Phos 33 - 130 U/L - 59 54  AST 10 - 35 U/L - 21 22  ALT  6 - 29 U/L - 26 23    Imaging: No results found.  Speciality Comments: No specialty comments available.    Procedures:  No procedures performed Allergies: Latex; Methotrexate derivatives; Neomycin-bacitracin zn-polymyx; and Tetracycline   Assessment / Plan:     Visit Diagnoses: 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[place]) - +RF,+antiCCP. She has no synovitis on examination today. She states recently she's been doing well. At this time I do not think she needs any additional therapy. We did discuss possible use of Arava in case she has a flare. A handout RA was given. For her to review.  High risk medication use - Plaquenil 200 mg by mouth twice a day Monday to Friday. Methotrexate was discontinued due to rash.  Patient complains of intermittent numbness in her right foot. I advised with her if her symptoms persist she may have to see a neurologist for evaluation.  Primary osteoarthritis of both hands: She has some stiffness  Primary osteoarthritis of both knees: The pain is tolerable currently  DDD (degenerative disc disease), lumbar:  Chronic discomfort  History of basal cell carcinoma  History of gastroesophageal reflux (GERD)  History of hypertension    Orders: No orders of the defined types were placed in this encounter.  No orders of the defined types were placed in this encounter.   Face-to-face time spent with patient was 30 minutes. 50% of time was spent in counseling and coordination of care.  Follow-Up Instructions: Return in about 5 months (around 01/17/2017) for Rheumatoid arthritis.   Bo Merino, MD  Note - This record has been created using Editor, commissioning.  Chart creation errors have been sought, but may not always  have been located. Such creation errors do not reflect on  the standard of medical care.

## 2016-08-17 ENCOUNTER — Encounter: Payer: Self-pay | Admitting: Rheumatology

## 2016-08-17 ENCOUNTER — Ambulatory Visit (INDEPENDENT_AMBULATORY_CARE_PROVIDER_SITE_OTHER): Payer: Medicare Other | Admitting: Rheumatology

## 2016-08-17 ENCOUNTER — Encounter (INDEPENDENT_AMBULATORY_CARE_PROVIDER_SITE_OTHER): Payer: Self-pay

## 2016-08-17 VITALS — BP 148/78 | HR 78 | Resp 16 | Ht 63.0 in | Wt 190.0 lb

## 2016-08-17 DIAGNOSIS — M19041 Primary osteoarthritis, right hand: Secondary | ICD-10-CM

## 2016-08-17 DIAGNOSIS — M5136 Other intervertebral disc degeneration, lumbar region: Secondary | ICD-10-CM

## 2016-08-17 DIAGNOSIS — M170 Bilateral primary osteoarthritis of knee: Secondary | ICD-10-CM | POA: Diagnosis not present

## 2016-08-17 DIAGNOSIS — Z8679 Personal history of other diseases of the circulatory system: Secondary | ICD-10-CM | POA: Diagnosis not present

## 2016-08-17 DIAGNOSIS — M0579 Rheumatoid arthritis with rheumatoid factor of multiple sites without organ or systems involvement: Secondary | ICD-10-CM

## 2016-08-17 DIAGNOSIS — Z8719 Personal history of other diseases of the digestive system: Secondary | ICD-10-CM

## 2016-08-17 DIAGNOSIS — Z85828 Personal history of other malignant neoplasm of skin: Secondary | ICD-10-CM

## 2016-08-17 DIAGNOSIS — Z79899 Other long term (current) drug therapy: Secondary | ICD-10-CM

## 2016-08-17 DIAGNOSIS — M19042 Primary osteoarthritis, left hand: Secondary | ICD-10-CM | POA: Diagnosis not present

## 2016-08-17 NOTE — Progress Notes (Signed)
Rheumatology Medication Review by a Pharmacist Does the patient feel that his/her medications are working for him/her?  Yes Has the patient been experiencing any side effects to the medications prescribed?  No Does the patient have any problems obtaining medications?  No  Issues to address at subsequent visits: None   Pharmacist comments:  Jasmynn is a pleasant 71 yo F who presents for follow up of rheumatoid arthritis.  She is currently taking hydroxychloroquine 200 mg twice daily Monday through Friday.  She is no longer on methotrexate as it caused a rash.  Patient had standing labs on 07/06/16.  She will be due for labs again in September 2018 and every 5 months.  Patient reports she had hydroxychloroquine eye exam in January 2018 which was normal.  We have faxed her eye doctor to request that report.  Patient denies any questions or concerns regarding her medications at this time.  Leflunomide was considered as an option in the future depending on how patient does on hydroxychloroquine monotherapy.  Patient was provided information to read about leflunomide.   Elisabeth Most, Pharm.D., BCPS, CPP Clinical Pharmacist Pager: [phone] Phone: [phone] 08/17/2016 2:55 PM

## 2016-08-17 NOTE — Patient Instructions (Addendum)
Standing Labs We placed an order today for your standing lab work.    Please come back and get your standing labs in September 2018 and every 5 months.   We have open lab Monday through Friday from 8:30-11:30 AM and 1:30-4 PM at the office of Dr. Tresa Moore, PA.   The office is located at [address] [place], [place], [place] [address] No appointment is necessary.   Labs are drawn by [facility].  You may receive a bill from [facility] for your lab work. If you have any questions regarding directions or hours of operation,  please call [phone].     Leflunomide tablets What is this medicine? LEFLUNOMIDE (le FLOO na mide) is for rheumatoid arthritis. This medicine may be used for other purposes; ask your health care provider or pharmacist if you have questions. COMMON BRAND NAME(S): Arava What should I tell my health care provider before I take this medicine? They need to know if you have any of these conditions: -alcoholism -bone marrow problems -fever or infection -immune system problems -kidney disease -liver disease -an unusual or allergic reaction to leflunomide, teriflunomide, other medicines, lactose, foods, dyes, or preservatives -pregnant or trying to get pregnant -breast-feeding How should I use this medicine? Take this medicine by mouth with a full glass of water. Follow the directions on the prescription label. Take your medicine at regular intervals. Do not take your medicine more often than directed. Do not stop taking except on your doctor's advice. Talk to your pediatrician regarding the use of this medicine in children. Special care may be needed. Overdosage: If you think you have taken too much of this medicine contact a poison control center or emergency room at once. NOTE: This medicine is only for you. Do not share this medicine with others. What if I miss a dose? If you miss a dose, take it as soon as you can. If it is almost time for  your next dose, take only that dose. Do not take double or extra doses. What may interact with this medicine? Do not take this medicine with any of the following medications: -teriflunomide This medicine may also interact with the following medications: -charcoal -cholestyramine -methotrexate -NSAIDs, medicines for pain and inflammation, like ibuprofen or naproxen -phenytoin -rifampin -tolbutamide -vaccines -warfarin This list may not describe all possible interactions. Give your health care provider a list of all the medicines, herbs, non-prescription drugs, or dietary supplements you use. Also tell them if you smoke, drink alcohol, or use illegal drugs. Some items may interact with your medicine. What should I watch for while using this medicine? Visit your doctor or health care professional for regular checks on your progress. You will need frequent blood checks while you are receiving the medicine. If you get a cold or other infection while receiving this medicine, call your doctor or health care professional. Do not treat yourself. The medicine may increase your risk of getting an infection. If you are a woman who has the potential to become pregnant, discuss birth control options with your doctor or health care professional. Dennis Bast must not be pregnant, and you must be using a reliable form of birth control. The medicine may harm an unborn baby. Immediately call your doctor if you think you might be pregnant. Alcoholic drinks may increase possible damage to your liver. Do not drink alcohol while taking this medicine. What side effects may I notice from receiving this medicine? Side effects that you should report to your doctor  or health care professional as soon as possible: -allergic reactions like skin rash, itching or hives, swelling of the face, lips, or tongue -cough -difficulty breathing or shortness of breath -fever, chills or any other sign of infection -redness, blistering,  peeling or loosening of the skin, including inside the mouth -unusual bleeding or bruising -unusually weak or tired -vomiting -yellowing of eyes or skin Side effects that usually do not require medical attention (report to your doctor or health care professional if they continue or are bothersome): -diarrhea -hair loss -headache -nausea This list may not describe all possible side effects. Call your doctor for medical advice about side effects. You may report side effects to FDA at 1-800-FDA-1088. Where should I keep my medicine? Keep out of the reach of children. Store at room temperature between 15 and 30 degrees C (59 and 86 degrees F). Protect from moisture and light. Throw away any unused medicine after the expiration date. NOTE: This sheet is a summary. It may not cover all possible information. If you have questions about this medicine, talk to your doctor, pharmacist, or health care provider.  2018 Elsevier/Gold Standard (2013-03-06 10:53:11)

## 2016-09-23 ENCOUNTER — Ambulatory Visit: Payer: Medicare Other | Admitting: Rheumatology

## 2016-10-04 ENCOUNTER — Encounter: Payer: Self-pay | Admitting: Rheumatology

## 2016-10-04 ENCOUNTER — Ambulatory Visit (INDEPENDENT_AMBULATORY_CARE_PROVIDER_SITE_OTHER): Payer: Medicare Other | Admitting: Rheumatology

## 2016-10-04 VITALS — BP 169/87 | HR 89 | Resp 16 | Ht 62.5 in | Wt 193.0 lb

## 2016-10-04 DIAGNOSIS — E039 Hypothyroidism, unspecified: Secondary | ICD-10-CM | POA: Diagnosis not present

## 2016-10-04 DIAGNOSIS — M170 Bilateral primary osteoarthritis of knee: Secondary | ICD-10-CM

## 2016-10-04 DIAGNOSIS — M19041 Primary osteoarthritis, right hand: Secondary | ICD-10-CM

## 2016-10-04 DIAGNOSIS — E7800 Pure hypercholesterolemia, unspecified: Secondary | ICD-10-CM

## 2016-10-04 DIAGNOSIS — K2100 Gastro-esophageal reflux disease with esophagitis, without bleeding: Secondary | ICD-10-CM

## 2016-10-04 DIAGNOSIS — M19042 Primary osteoarthritis, left hand: Secondary | ICD-10-CM

## 2016-10-04 DIAGNOSIS — I10 Essential (primary) hypertension: Secondary | ICD-10-CM

## 2016-10-04 DIAGNOSIS — Z79899 Other long term (current) drug therapy: Secondary | ICD-10-CM

## 2016-10-04 DIAGNOSIS — M0579 Rheumatoid arthritis with rheumatoid factor of multiple sites without organ or systems involvement: Secondary | ICD-10-CM | POA: Diagnosis not present

## 2016-10-04 DIAGNOSIS — Z85828 Personal history of other malignant neoplasm of skin: Secondary | ICD-10-CM

## 2016-10-04 DIAGNOSIS — M5136 Other intervertebral disc degeneration, lumbar region: Secondary | ICD-10-CM

## 2016-10-04 DIAGNOSIS — M51369 Other intervertebral disc degeneration, lumbar region without mention of lumbar back pain or lower extremity pain: Secondary | ICD-10-CM

## 2016-10-04 LAB — CBC WITH DIFFERENTIAL/PLATELET
BASOS PCT: 1 %
Basophils Absolute: 64 cells/uL (ref 0–200)
EOS ABS: 192 {cells}/uL (ref 15–500)
Eosinophils Relative: 3 %
HCT: 35.2 % (ref 35.0–45.0)
Hemoglobin: 11.7 g/dL (ref 11.7–15.5)
LYMPHS PCT: 27 %
Lymphs Abs: 1728 cells/uL (ref 850–3900)
MCH: 30.4 pg (ref 27.0–33.0)
MCHC: 33.2 g/dL (ref 32.0–36.0)
MCV: 91.4 fL (ref 80.0–100.0)
MONOS PCT: 12 %
MPV: 9 fL (ref 7.5–12.5)
Monocytes Absolute: 768 cells/uL (ref 200–950)
NEUTROS PCT: 57 %
Neutro Abs: 3648 cells/uL (ref 1500–7800)
PLATELETS: 341 10*3/uL (ref 140–400)
RBC: 3.85 MIL/uL (ref 3.80–5.10)
RDW: 13.3 % (ref 11.0–15.0)
WBC: 6.4 10*3/uL (ref 3.8–10.8)

## 2016-10-04 LAB — COMPLETE METABOLIC PANEL WITH GFR
ALT: 15 U/L (ref 6–29)
AST: 19 U/L (ref 10–35)
Albumin: 4.3 g/dL (ref 3.6–5.1)
Alkaline Phosphatase: 84 U/L (ref 33–130)
BUN: 14 mg/dL (ref 7–25)
CHLORIDE: 102 mmol/L (ref 98–110)
CO2: 27 mmol/L (ref 20–31)
Calcium: 10.2 mg/dL (ref 8.6–10.4)
Creat: 1.05 mg/dL — ABNORMAL HIGH (ref 0.60–0.93)
GFR, EST NON AFRICAN AMERICAN: 54 mL/min — AB (ref >=60)
GFR, Est African American: 62 mL/min (ref >=60)
Glucose, Bld: 97 mg/dL (ref 65–99)
POTASSIUM: 4.7 mmol/L (ref 3.5–5.3)
Sodium: 139 mmol/L (ref 135–146)
Total Bilirubin: 0.4 mg/dL (ref 0.2–1.2)
Total Protein: 7.5 g/dL (ref 6.1–8.1)

## 2016-10-04 NOTE — Patient Instructions (Signed)
Leflunomide tablets What is this medicine? LEFLUNOMIDE (le FLOO na mide) is for rheumatoid arthritis. This medicine may be used for other purposes; ask your health care provider or pharmacist if you have questions. COMMON BRAND NAME(S): Arava What should I tell my health care provider before I take this medicine? They need to know if you have any of these conditions: -alcoholism -bone marrow problems -fever or infection -immune system problems -kidney disease -liver disease -an unusual or allergic reaction to leflunomide, teriflunomide, other medicines, lactose, foods, dyes, or preservatives -pregnant or trying to get pregnant -breast-feeding How should I use this medicine? Take this medicine by mouth with a full glass of water. Follow the directions on the prescription label. Take your medicine at regular intervals. Do not take your medicine more often than directed. Do not stop taking except on your doctor's advice. Talk to your pediatrician regarding the use of this medicine in children. Special care may be needed. Overdosage: If you think you have taken too much of this medicine contact a poison control center or emergency room at once. NOTE: This medicine is only for you. Do not share this medicine with others. What if I miss a dose? If you miss a dose, take it as soon as you can. If it is almost time for your next dose, take only that dose. Do not take double or extra doses. What may interact with this medicine? Do not take this medicine with any of the following medications: -teriflunomide This medicine may also interact with the following medications: -charcoal -cholestyramine -methotrexate -NSAIDs, medicines for pain and inflammation, like ibuprofen or naproxen -phenytoin -rifampin -tolbutamide -vaccines -warfarin This list may not describe all possible interactions. Give your health care provider a list of all the medicines, herbs, non-prescription drugs, or dietary  supplements you use. Also tell them if you smoke, drink alcohol, or use illegal drugs. Some items may interact with your medicine. What should I watch for while using this medicine? Visit your doctor or health care professional for regular checks on your progress. You will need frequent blood checks while you are receiving the medicine. If you get a cold or other infection while receiving this medicine, call your doctor or health care professional. Do not treat yourself. The medicine may increase your risk of getting an infection. If you are a woman who has the potential to become pregnant, discuss birth control options with your doctor or health care professional. Dennis Bast must not be pregnant, and you must be using a reliable form of birth control. The medicine may harm an unborn baby. Immediately call your doctor if you think you might be pregnant. Alcoholic drinks may increase possible damage to your liver. Do not drink alcohol while taking this medicine. What side effects may I notice from receiving this medicine? Side effects that you should report to your doctor or health care professional as soon as possible: -allergic reactions like skin rash, itching or hives, swelling of the face, lips, or tongue -cough -difficulty breathing or shortness of breath -fever, chills or any other sign of infection -redness, blistering, peeling or loosening of the skin, including inside the mouth -unusual bleeding or bruising -unusually weak or tired -vomiting -yellowing of eyes or skin Side effects that usually do not require medical attention (report to your doctor or health care professional if they continue or are bothersome): -diarrhea -hair loss -headache -nausea This list may not describe all possible side effects. Call your doctor for medical advice about side effects. You  may report side effects to FDA at 1-800-FDA-1088. Where should I keep my medicine? Keep out of the reach of children. Store at  room temperature between 15 and 30 degrees C (59 and 86 degrees F). Protect from moisture and light. Throw away any unused medicine after the expiration date. NOTE: This sheet is a summary. It may not cover all possible information. If you have questions about this medicine, talk to your doctor, pharmacist, or health care provider.  2018 Elsevier/Gold Standard (2013-03-06 10:53:11)

## 2016-10-04 NOTE — Progress Notes (Signed)
Office Visit Note  Patient: Sally Diaz             Date of Birth: Jul 27, 1945           MRN: [id number]             PCP: Bernerd Limbo, MD Referring: Bernerd Limbo, MD Visit Date: 10/04/2016 Occupation: @GUAROCC @    Subjective:  Rheumatoid Arthritis (Flare x 2 weeks)   History of Present Illness: Sally Diaz is a 71 y.o. female with history of sero positive rheumatoid arthritis. She states in the first week of some July she had company and she was very busy for 1 week. During that week she was dizzy at home and then she went on hiking. When she came back she started having swelling in her bilateral feet. Her hands have been swollen and she has discomfort in her right shoulder. The feet swelling that was started in the last 1 week. It is more prominent in her right foot than the left. She has lot of pain and stiffness in the morning. She continues to take her Plaquenil. Methotrexate was discontinued in the past due to episode of rash. It took her a while to get rid of rash she was on prednisone taper.  Activities of Daily Living:  Patient reports morning stiffness for 3 hours.   Patient Denies nocturnal pain.  Difficulty dressing/grooming: Denies Difficulty climbing stairs: Reports Difficulty getting out of chair: Reports Difficulty using hands for taps, buttons, cutlery, and/or writing: Reports   Review of Systems  Constitutional: Positive for fatigue. Negative for night sweats, weight gain, weight loss and weakness.  HENT: Positive for mouth dryness. Negative for mouth sores, trouble swallowing, trouble swallowing and nose dryness.   Eyes: Negative for pain, redness, visual disturbance and dryness.  Respiratory: Negative for cough, shortness of breath and difficulty breathing.   Cardiovascular: Negative for chest pain, palpitations, hypertension, irregular heartbeat and swelling in legs/feet.  Gastrointestinal: Negative for blood in stool, constipation and diarrhea.    Endocrine: Negative for increased urination.  Genitourinary: Negative for vaginal dryness.  Musculoskeletal: Positive for arthralgias, joint pain, joint swelling and morning stiffness. Negative for myalgias, muscle weakness, muscle tenderness and myalgias.  Skin: Negative for color change, rash, hair loss, skin tightness, ulcers and sensitivity to sunlight.  Allergic/Immunologic: Negative for susceptible to infections.  Neurological: Negative for dizziness, memory loss and night sweats.  Hematological: Negative for swollen glands.  Psychiatric/Behavioral: Positive for depressed mood and sleep disturbance. The patient is not nervous/anxious.     PMFS History:  Patient Active Problem List   Diagnosis Date Noted  . Acute midline low back pain without sciatica 06/17/2016  . High risk medication use 03/02/2016  . DDD (degenerative disc disease), lumbar 03/02/2016  . 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 Osteoarthritis, knee 03/02/2016  . Acute bronchitis 07/17/2012  . 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[place]) 11/11/2008  . History of basal cell carcinoma 06/23/2007  . Hypothyroidism 06/23/2007  . HYPERCHOLESTEROLEMIA 06/23/2007  . Essential hypertension 06/23/2007  . Seasonal and perennial allergic rhinitis 06/23/2007  . OBESITY, UNSPECIFIED 05/04/2007  . ESOPHAGITIS, REFLUX 06/04/2005  . DIVERTICULOSIS, COLON 06/04/2005    Past Medical History:  Diagnosis Date  . Allergy, unspecified not elsewhere classified   . Anemia, iron deficiency   . Basal cell carcinoma   . Diverticulosis of colon   . Hx of adenomatous colonic polyps   . Hypercholesterolemia   . Hypertension   . Hypothyroidism   . Obesity   .  Reflux esophagitis   . Rheumatoid arthritis ([place])     Family History  Problem Relation Age of Onset  . Colon cancer Cousin        Maternal Side  . Diabetes Maternal Grandmother   . Aneurysm Maternal Grandmother        brain  . Heart disease Mother   . Bladder  Cancer Mother   . Stroke Father   . Pneumonia Father   . Lung cancer Brother   . AAA (abdominal aortic aneurysm) Brother    Past Surgical History:  Procedure Laterality Date  . CATARACT EXTRACTION, BILATERAL    . CERVICAL CONE BIOPSY    . ENDOMETRIAL ABLATION    . TONSILLECTOMY     Social History   Social History Narrative  . No narrative on file     Objective: Vital Signs: BP (!) 169/87 (BP Location: Left Arm, Patient Position: Sitting, Cuff Size: Normal)   Pulse 89   Resp 16   Ht 5' 2.5" (1.588 m)   Wt 193 lb (87.5 kg)   BMI 34.74 kg/m    Physical Exam  Constitutional: She is oriented to person, place, and time. She appears well-developed and well-nourished.  HENT:  Head: Normocephalic and atraumatic.  Eyes: Conjunctivae and EOM are normal.  Neck: Normal range of motion.  Cardiovascular: Normal rate, regular rhythm, normal heart sounds and intact distal pulses.   Pulmonary/Chest: Effort normal and breath sounds normal.  Abdominal: Soft. Bowel sounds are normal.  Lymphadenopathy:    She has no cervical adenopathy.  Neurological: She is alert and oriented to person, place, and time.  Skin: Skin is warm and dry. Capillary refill takes less than 2 seconds.  Psychiatric: She has a normal mood and affect. Her behavior is normal.  Nursing note and vitals reviewed.    Musculoskeletal Exam: C-spine, thoracic, lumbar spine good range of motion. She is painful range of motion of her right shoulder. Elbow joints are good range of motion. She has synovitis on palpation of her right second MCP joint. She is swelling over bilateral ankle joints. Although joints are good range of motion with no synovitis.  CDAI Exam: CDAI Homunculus Exam:   Tenderness:  RUE: glenohumeral Right hand: 2nd MCP RLE: tibiotalar LLE: tibiotalar  Swelling:  Right hand: 2nd MCP RLE: tibiotalar LLE: tibiotalar  Joint Counts:  CDAI Tender Joint count: 2 CDAI Swollen Joint count: 1  Global  Assessments:  Patient Global Assessment: 6 Provider Global Assessment: 6  CDAI Calculated Score: 15    Investigation: No additional findings.  09/16/2015 TB gold negative, SPEP negative, IgM low at 22 11/03/2007 hepatitis panel was negative Imaging: No results found.  Speciality Comments: No specialty comments available.    Procedures:  No procedures performed Allergies: Latex; Methotrexate derivatives; Neomycin-bacitracin zn-polymyx; and Tetracycline   Assessment / Plan:     Visit Diagnoses: 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[place]) - +RF, +anti-CCP. Patient is having a flare flare with pain and swelling in multiple joints. She's having discomfort in her right shoulder she is swelling in her MCPs and also swelling in her ankle joints.  High risk medication use - PLQ 200mg  po bid Mon-Fri ( MTX -Rash). Detailed discussion regarding different treatment options and their side effects. Indications side effects contraindications of Arava were discussed at length. A handout was given she would like to review the information and bring the consent form. I will check her CBC and CMP today. Once she makes a decision we can  start her on [place].  Primary osteoarthritis of both hands: She is continues to have some stiffness  Primary osteoarthritis of both knees: She's not having much discomfort in her knee joints.  DDD (degenerative disc disease), lumbar: Chronic pain  Essential hypertension: Her blood pressure is elevated she states she is quite upset and under a lot of stress. She states her blood pressure was normal this morning.  Acquired hypothyroidism  HYPERCHOLESTEROLEMIA  History of basal cell carcinoma  ESOPHAGITIS, REFLUX    Orders: Orders Placed This Encounter  Procedures  . CBC with Differential/Platelet  . COMPLETE METABOLIC PANEL WITH GFR   No orders of the defined types were placed in this encounter.   Face-to-face time spent with patient was 30  minutes. 50% of time was spent in counseling and coordination of care.  Follow-Up Instructions: Return in about 3 months (around 01/04/2017) for Rheumatoid arthritis.   Bo Merino, MD  Note - This record has been created using Editor, commissioning.  Chart creation errors have been sought, but may not always  have been located. Such creation errors do not reflect on  the standard of medical care.

## 2016-10-04 NOTE — Progress Notes (Signed)
Stable. OK to call in [place] after consent

## 2016-10-07 ENCOUNTER — Telehealth: Payer: Self-pay

## 2016-10-07 NOTE — Telephone Encounter (Signed)
Patient was returning a call back to [place].

## 2016-10-22 ENCOUNTER — Ambulatory Visit (INDEPENDENT_AMBULATORY_CARE_PROVIDER_SITE_OTHER): Payer: Medicare Other

## 2016-10-22 ENCOUNTER — Ambulatory Visit (INDEPENDENT_AMBULATORY_CARE_PROVIDER_SITE_OTHER): Payer: Medicare Other | Admitting: Podiatry

## 2016-10-22 ENCOUNTER — Encounter: Payer: Self-pay | Admitting: Podiatry

## 2016-10-22 VITALS — BP 157/92 | HR 83

## 2016-10-22 DIAGNOSIS — M19072 Primary osteoarthritis, left ankle and foot: Secondary | ICD-10-CM

## 2016-10-22 DIAGNOSIS — M792 Neuralgia and neuritis, unspecified: Secondary | ICD-10-CM

## 2016-10-22 DIAGNOSIS — R52 Pain, unspecified: Secondary | ICD-10-CM

## 2016-10-22 DIAGNOSIS — M2141 Flat foot [pes planus] (acquired), right foot: Secondary | ICD-10-CM

## 2016-10-22 DIAGNOSIS — M2142 Flat foot [pes planus] (acquired), left foot: Secondary | ICD-10-CM

## 2016-10-22 DIAGNOSIS — M19071 Primary osteoarthritis, right ankle and foot: Secondary | ICD-10-CM | POA: Diagnosis not present

## 2016-10-22 NOTE — Progress Notes (Signed)
   Subjective:    Patient ID: Sally Diaz, female    DOB: 1945-04-19, 71 y.o.   MRN: [id number]  HPI Ms. Heidel Since the office today for concerns of bilateral foot "not feeling right" with the left worse than the right. She states that she gets a numbness-type sensation to the office after the foot she points to the fourth and fifth toes to the lateral aspect of the midfoot. She states this has been ongoing for the last 3-4 months. She did have pain with this as well swelling and she was seen in urgent care and she was given prednisone which should help quite a bit. However she states that she continues to get odd sensations of the optic aspect both of her feet which is intermittent in nature. She has no other concerns today. She denies any recent injury or trauma. She has a history of rheumatoid arthritis.   Review of Systems  Musculoskeletal:       Joint pain       Objective:   Physical Exam General: AAO x3, NAD  Dermatological: Skin is warm, dry and supple bilateral. Nails x 10 are well manicured; remaining integument appears unremarkable at this time. There are no open sores, no preulcerative lesions, no rash or signs of infection present.  Vascular: Dorsalis Pedis artery and Posterior Tibial artery pedal pulses are 2/4 bilateral with immedate capillary fill time. There is no pain with calf compression, swelling, warmth, erythema.   Neruologic: Grossly intact via light touch bilateral. Vibratory intact via tuning fork bilateral. Protective threshold with Semmes Wienstein monofilament intact to all pedal sites bilateral. Negative nail sign present bilaterally.  Musculoskeletal: There is a decrease in medial arch height upon weightbearing during gait there is excessive pronation. Mild equinus is present. No area pinpoint bony tenderness or pain the vibratory sensation. There is no amount edema, erythema, increase in warmth. Muscular strength 5/5 in all groups tested  bilateral.  Gait: Unassisted, Nonantalgic.      Assessment & Plan:  71 year old female with bilateral foot discomfort left side worse in the right -Treatment options discussed including all alternatives, risks, and complications -Etiology of symptoms were discussed -X-rays were obtained and reviewed with the patient. There is no evidence of acute fracture identified today. -I with some of her symptoms are coming from biomechanical changes. I do recommend orthotics. At this point of the try a custom insert. I would have her come back to see Liliane Channel for molding. -Cannot rule out other etiologies such as nerve entrapment however she has no other symptoms. If orthotics are helping this conduction test.  Celesta Gentile, DPM

## 2016-10-27 ENCOUNTER — Other Ambulatory Visit: Payer: Medicare Other | Admitting: Orthotics

## 2016-11-18 ENCOUNTER — Other Ambulatory Visit: Payer: Medicare Other | Admitting: Orthotics

## 2016-11-18 ENCOUNTER — Ambulatory Visit: Payer: Medicare Other | Admitting: Rheumatology

## 2016-11-23 ENCOUNTER — Telehealth: Payer: Self-pay | Admitting: Rheumatology

## 2016-11-23 MED ORDER — PREDNISONE 5 MG PO TABS: ORAL_TABLET | ORAL | 0 refills | Status: DC

## 2016-11-23 NOTE — Telephone Encounter (Signed)
Patient states she is having a flare. Patient states it is in her left hand. Patient states she is having pain and swelling. Patient states the flare has been going on for about 1 week now and has progressively worse. Patient states she is having trouble bending the third fourth and fifth fingers. Patient is on PLQ and is taking it as prescribed.  Patient is requesting a prescription for Prednisone. Please advise.

## 2016-11-23 NOTE — Telephone Encounter (Signed)
Patient states she has considered the [place] and does not wish to try it at this time. patient advised prescription for prednisone being sent to the pharmacy.

## 2016-11-23 NOTE — Telephone Encounter (Signed)
Patient calling due to having a flare, and requesting a rx for Prednisone to be called in. Patient uses [facility]. Patient states lt hand is stiff, hard to bend fingers and use them. Please call to advise.

## 2016-11-23 NOTE — Telephone Encounter (Signed)
She may consider adding Arava which was discussed during the last visit. Okay to give prednisone taper 5 mg tablet 4 tablets for 4 days, 3 for 4 days, 2 for 4 days, 1 for 4 days, half for 4 days

## 2016-11-24 ENCOUNTER — Ambulatory Visit: Payer: Self-pay | Admitting: Orthotics

## 2016-11-24 DIAGNOSIS — M779 Enthesopathy, unspecified: Secondary | ICD-10-CM

## 2016-11-24 DIAGNOSIS — M792 Neuralgia and neuritis, unspecified: Secondary | ICD-10-CM

## 2016-11-24 DIAGNOSIS — M2142 Flat foot [pes planus] (acquired), left foot: Principal | ICD-10-CM

## 2016-11-24 DIAGNOSIS — M2141 Flat foot [pes planus] (acquired), right foot: Secondary | ICD-10-CM

## 2016-11-24 NOTE — Progress Notes (Signed)
Patient came in today to pick up custom made foot orthotics.  The goals were accomplished and the patient reported no dissatisfaction with said orthotics.  Patient was advised of breakin period and how to report any issues.  Patient paying $300

## 2016-12-01 ENCOUNTER — Other Ambulatory Visit: Payer: Self-pay | Admitting: Rheumatology

## 2016-12-01 NOTE — Telephone Encounter (Signed)
Last visit 10/04/16 Next visit 01/18/17    CBC Latest Ref Rng & Units 10/04/2016 07/15/2016 06/09/2016  WBC 3.8 - 10.8 K/uL 6.4 8.8 5.9  Hemoglobin 11.7 - 15.5 g/dL 11.7 11.6(L) 10.7(L)  Hematocrit 35.0 - 45.0 % 35.2 34.7(L) 32.0(L)  Platelets 140 - 400 K/uL 341 267 270    CMP Latest Ref Rng & Units 10/04/2016 07/15/2016 06/09/2016  Glucose 65 - 99 mg/dL 97 110(H) 95  BUN 7 - 25 mg/dL 14 27(H) 12  Creatinine 0.60 - 0.93 mg/dL 1.05(H) 1.30(H) 0.93  Sodium 135 - 146 mmol/L 139 136 139  Potassium 3.5 - 5.3 mmol/L 4.7 3.3(L) 4.0  Chloride 98 - 110 mmol/L 102 98(L) 105  CO2 20 - 31 mmol/L 27 29 27   Calcium 8.6 - 10.4 mg/dL 10.2 9.5 9.2  Total Protein 6.1 - 8.1 g/dL 7.5 - 6.5  Total Bilirubin 0.2 - 1.2 mg/dL 0.4 - 0.7  Alkaline Phos 33 - 130 U/L 84 - 59  AST 10 - 35 U/L 19 - 21  ALT 6 - 29 U/L 15 - 26   04/09/16 normal PlQ eye exam

## 2017-01-05 NOTE — Progress Notes (Signed)
Office Visit Note  Patient: Sally Diaz             Date of Birth: Jan 10, 1946           MRN: [id number]             PCP: Bernerd Limbo, MD Referring: Bernerd Limbo, MD Visit Date: 01/18/2017 Occupation: @GUAROCC @    Subjective:  Pain in shoulders and left thumb.   History of Present Illness: Sally Diaz is a 71 y.o. female with history of sero positive rheumatoid arthritis. She states recently she's been having discomfort in her bilateral shoulders. She's been also having some discomfort in her left thumb which she describes over left first MCP joint. She states her PCP gave her a brace to wear for possible tendinitis. Her knee joints and feet are doing better currently and she does not have much lower back discomfort.  Activities of Daily Living:  Patient reports morning stiffness for 15  minutes.   Patient Reports nocturnal pain.  Difficulty dressing/grooming: Denies Difficulty climbing stairs: Reports Difficulty getting out of chair: Reports Difficulty using hands for taps, buttons, cutlery, and/or writing: Denies   Review of Systems  Constitutional: Positive for fatigue. Negative for weakness.  HENT: Positive for mouth dryness.   Eyes: Negative for dryness.  Respiratory: Negative.  Negative for cough, shortness of breath and difficulty breathing.   Cardiovascular: Negative.  Positive for hypertension. Negative for palpitations and irregular heartbeat.  Gastrointestinal: Negative.  Negative for blood in stool, constipation and diarrhea.  Endocrine: Negative.   Genitourinary: Positive for nocturia.  Musculoskeletal: Positive for arthralgias, joint pain and morning stiffness. Negative for joint swelling, myalgias, muscle weakness and myalgias.  Skin: Negative.  Negative for color change, rash, hair loss, ulcers and sensitivity to sunlight.  Neurological: Negative for headaches.  Hematological: Negative.  Negative for swollen glands.  Psychiatric/Behavioral:  Positive for sleep disturbance. Negative for depressed mood. The patient is not nervous/anxious.     PMFS History:  Patient Active Problem List   Diagnosis Date Noted  . Acute midline low back pain without sciatica 06/17/2016  . High risk medication use 03/02/2016  . DDD (degenerative disc disease), lumbar 03/02/2016  . 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 Osteoarthritis, knee 03/02/2016  . Acute bronchitis 07/17/2012  . 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[place]) 11/11/2008  . History of basal cell carcinoma 06/23/2007  . Hypothyroidism 06/23/2007  . HYPERCHOLESTEROLEMIA 06/23/2007  . Essential hypertension 06/23/2007  . Seasonal and perennial allergic rhinitis 06/23/2007  . OBESITY, UNSPECIFIED 05/04/2007  . ESOPHAGITIS, REFLUX 06/04/2005  . DIVERTICULOSIS, COLON 06/04/2005    Past Medical History:  Diagnosis Date  . Allergy, unspecified not elsewhere classified   . Anemia, iron deficiency   . Basal cell carcinoma   . Diverticulosis of colon   . Hx of adenomatous colonic polyps   . Hypercholesterolemia   . Hypertension   . Hypothyroidism   . Obesity   . Reflux esophagitis   . Rheumatoid arthritis ([place])     Family History  Problem Relation Age of Onset  . Colon cancer Cousin        Maternal Side  . Diabetes Maternal Grandmother   . Aneurysm Maternal Grandmother        brain  . Heart disease Mother   . Bladder Cancer Mother   . Stroke Father   . Pneumonia Father   . Lung cancer Brother   . AAA (abdominal aortic aneurysm) Brother    Past Surgical History:  Procedure Laterality Date  . CATARACT EXTRACTION, BILATERAL    . CERVICAL CONE BIOPSY    . ENDOMETRIAL ABLATION    . TONSILLECTOMY     Social History   Social History Narrative  . No narrative on file     Objective: Vital Signs: BP (!) 149/80   Pulse 81   Resp 16   Ht 5' 2.5" (1.588 m)   Wt 182 lb (82.6 kg)   BMI 32.76 kg/m    Physical Exam  Constitutional: She is oriented to  person, place, and time. She appears well-developed and well-nourished.  HENT:  Head: Normocephalic and atraumatic.  Eyes: Conjunctivae and EOM are normal.  Neck: Normal range of motion.  Cardiovascular: Normal rate, regular rhythm, normal heart sounds and intact distal pulses.   Pulmonary/Chest: Effort normal and breath sounds normal.  Abdominal: Soft. Bowel sounds are normal.  Lymphadenopathy:    She has no cervical adenopathy.  Neurological: She is alert and oriented to person, place, and time.  Skin: Skin is warm and dry. Capillary refill takes less than 2 seconds.  Psychiatric: She has a normal mood and affect. Her behavior is normal.  Nursing note and vitals reviewed.    Musculoskeletal Exam: C-spine and thoracic good range of motion. Lumbar spine some discomfort range of motion. She has discomfort over bilateral subacromial bursa consistent with bursitis. Elbow joints wrist joint MCPs PIPs DIPs with good range of motion with no synovitis. She has left de Quervain's tenosynovitis. Hip joints knee joints ankles MTPs PIPs DIPs showed no synovitis on examination.  CDAI Exam: CDAI Homunculus Exam:   Tenderness:  RUE: glenohumeral LUE: glenohumeral Left hand: 1st MCP  Joint Counts:  CDAI Tender Joint count: 3 CDAI Swollen Joint count: 0  Global Assessments:  Patient Global Assessment: 5 Provider Global Assessment: 3  CDAI Calculated Score: 11    Investigation: No additional findings.PLQ eye exam: 03/2016 CBC Latest Ref Rng & Units 10/04/2016 07/15/2016 06/09/2016  WBC 3.8 - 10.8 K/uL 6.4 8.8 5.9  Hemoglobin 11.7 - 15.5 g/dL 11.7 11.6(L) 10.7(L)  Hematocrit 35.0 - 45.0 % 35.2 34.7(L) 32.0(L)  Platelets 140 - 400 K/uL 341 267 270   CMP Latest Ref Rng & Units 10/04/2016 07/15/2016 06/09/2016  Glucose 65 - 99 mg/dL 97 110(H) 95  BUN 7 - 25 mg/dL 14 27(H) 12  Creatinine 0.60 - 0.93 mg/dL 1.05(H) 1.30(H) 0.93  Sodium 135 - 146 mmol/L 139 136 139  Potassium 3.5 - 5.3 mmol/L 4.7  3.3(L) 4.0  Chloride 98 - 110 mmol/L 102 98(L) 105  CO2 20 - 31 mmol/L 27 29 27   Calcium 8.6 - 10.4 mg/dL 10.2 9.5 9.2  Total Protein 6.1 - 8.1 g/dL 7.5 - 6.5  Total Bilirubin 0.2 - 1.2 mg/dL 0.4 - 0.7  Alkaline Phos 33 - 130 U/L 84 - 59  AST 10 - 35 U/L 19 - 21  ALT 6 - 29 U/L 15 - 26    Imaging: Xr Hand 2 View Left  Result Date: 01/18/2017 Juxta articular osteopenia noted. First MCP and second MCP narrowing was noted. No erosive changes were noted. PIP/DIP narrowing was noted. No radiocarpal, intercarpal or metacarpal carpal joint space narrowing was noted. These findings were consistent with rheumatoid arthritis and osteoarthritis overlap.  Xr Hand 2 View Right  Result Date: 01/18/2017 Juxta articular osteopenia noted. First MCP and second MCP narrowing was noted. No erosive changes were noted. PIP/DIP narrowing was noted. No radiocarpal, intercarpal or metacarpal carpal joint space narrowing was  noted. These findings were consistent with rheumatoid arthritis and osteoarthritis overlap.   Speciality Comments: No specialty comments available.    Procedures:  No procedures performed Allergies: Latex; Methotrexate; Methotrexate derivatives; Neomycin-bacitracin zn-polymyx; and Tetracycline   Assessment / Plan:     Visit Diagnoses: 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[place]) -  +RF, +anti-CCP. Patient has no active synovitis on examination today. We had detailed discussion regarding her rheumatoid arthritis. Dosing of Plaquenil were also discussed at length.  High risk medication use - PLQ 200mg  po bid Mon-Fri ( MTX -Rash), prednisone. eye exam: 03/2016 - Plan: CBC with Differential/Platelet, COMPLETE METABOLIC PANEL WITH GFR. Her creatinine has been elevated. She's currently taking Mobic for tenosynovitis. Which may raise her creatinine further. We will continue to monitor labs every 3 months for right now.  Chronic pain of both shoulders: Clinical examination is  consistent with bilateral shoulder joint tendinopathy and subacromial bursitis. I will refer her to physical therapy .  Pain of left thumb: Consistent with de Quervain's tenosynovitis. I will refer to physical therapy.  Primary osteoarthritis of both hands: Chronic pain  Primary osteoarthritis of both knees: Chronic pain  DDD (degenerative disc disease), lumbar: Chronic pain  History of hypertension: Her blood pressure is elevated today. I've advised her to monitor blood pressure closely.  History of hypercholesterolemia  History of hypothyroidism  History of diverticulosis  History of gastroesophageal reflux (GERD)  History of basal cell carcinoma  Pain in both hands - Plan: XR Hand 2 View Right, XR Hand 2 View Left    Orders: Orders Placed This Encounter  Procedures  . XR Hand 2 View Right  . XR Hand 2 View Left  . CBC with Differential/Platelet  . COMPLETE METABOLIC PANEL WITH GFR  . Ambulatory referral to Physical Therapy  . Ambulatory referral to Physical Therapy   No orders of the defined types were placed in this encounter.   Face-to-face time spent with patient was 40 minutes. Greater than 50% of time was spent in counseling and coordination of care.  Follow-Up Instructions: Return in about 6 months (around 07/19/2017) for Rheumatoid arthritis, Osteoarthritis.   Bo Merino, MD  Note - This record has been created using Editor, commissioning.  Chart creation errors have been sought, but may not always  have been located. Such creation errors do not reflect on  the standard of medical care.

## 2017-01-18 ENCOUNTER — Encounter: Payer: Self-pay | Admitting: Rheumatology

## 2017-01-18 ENCOUNTER — Ambulatory Visit (INDEPENDENT_AMBULATORY_CARE_PROVIDER_SITE_OTHER): Payer: Medicare Other | Admitting: Rheumatology

## 2017-01-18 ENCOUNTER — Ambulatory Visit (INDEPENDENT_AMBULATORY_CARE_PROVIDER_SITE_OTHER): Payer: Self-pay

## 2017-01-18 ENCOUNTER — Ambulatory Visit (INDEPENDENT_AMBULATORY_CARE_PROVIDER_SITE_OTHER): Payer: Medicare Other

## 2017-01-18 VITALS — BP 149/80 | HR 81 | Resp 16 | Ht 62.5 in | Wt 182.0 lb

## 2017-01-18 DIAGNOSIS — Z8679 Personal history of other diseases of the circulatory system: Secondary | ICD-10-CM | POA: Diagnosis not present

## 2017-01-18 DIAGNOSIS — M25512 Pain in left shoulder: Secondary | ICD-10-CM

## 2017-01-18 DIAGNOSIS — M79642 Pain in left hand: Secondary | ICD-10-CM

## 2017-01-18 DIAGNOSIS — M170 Bilateral primary osteoarthritis of knee: Secondary | ICD-10-CM | POA: Diagnosis not present

## 2017-01-18 DIAGNOSIS — M79645 Pain in left finger(s): Secondary | ICD-10-CM

## 2017-01-18 DIAGNOSIS — M0579 Rheumatoid arthritis with rheumatoid factor of multiple sites without organ or systems involvement: Secondary | ICD-10-CM | POA: Diagnosis not present

## 2017-01-18 DIAGNOSIS — M79641 Pain in right hand: Secondary | ICD-10-CM

## 2017-01-18 DIAGNOSIS — M5136 Other intervertebral disc degeneration, lumbar region: Secondary | ICD-10-CM | POA: Diagnosis not present

## 2017-01-18 DIAGNOSIS — Z8719 Personal history of other diseases of the digestive system: Secondary | ICD-10-CM | POA: Diagnosis not present

## 2017-01-18 DIAGNOSIS — Z8639 Personal history of other endocrine, nutritional and metabolic disease: Secondary | ICD-10-CM | POA: Diagnosis not present

## 2017-01-18 DIAGNOSIS — Z85828 Personal history of other malignant neoplasm of skin: Secondary | ICD-10-CM

## 2017-01-18 DIAGNOSIS — Z79899 Other long term (current) drug therapy: Secondary | ICD-10-CM | POA: Diagnosis not present

## 2017-01-18 DIAGNOSIS — M19041 Primary osteoarthritis, right hand: Secondary | ICD-10-CM | POA: Diagnosis not present

## 2017-01-18 DIAGNOSIS — M25511 Pain in right shoulder: Secondary | ICD-10-CM | POA: Diagnosis not present

## 2017-01-18 DIAGNOSIS — G8929 Other chronic pain: Secondary | ICD-10-CM

## 2017-01-18 DIAGNOSIS — M19042 Primary osteoarthritis, left hand: Secondary | ICD-10-CM

## 2017-01-18 LAB — CBC WITH DIFFERENTIAL/PLATELET
Basophils Absolute: 80 cells/uL (ref 0–200)
Basophils Relative: 1.4 %
EOS PCT: 12.7 %
Eosinophils Absolute: 724 cells/uL — ABNORMAL HIGH (ref 15–500)
HEMATOCRIT: 35.4 % (ref 35.0–45.0)
Hemoglobin: 11.9 g/dL (ref 11.7–15.5)
LYMPHS ABS: 1032 {cells}/uL (ref 850–3900)
MCH: 29 pg (ref 27.0–33.0)
MCHC: 33.6 g/dL (ref 32.0–36.0)
MCV: 86.3 fL (ref 80.0–100.0)
MPV: 9.7 fL (ref 7.5–12.5)
Monocytes Relative: 13.3 %
NEUTROS ABS: 3107 {cells}/uL (ref 1500–7800)
NEUTROS PCT: 54.5 %
Platelets: 282 10*3/uL (ref 140–400)
RBC: 4.1 10*6/uL (ref 3.80–5.10)
RDW: 12.9 % (ref 11.0–15.0)
Total Lymphocyte: 18.1 %
WBC mixed population: 758 cells/uL (ref 200–950)
WBC: 5.7 10*3/uL (ref 3.8–10.8)

## 2017-01-18 LAB — COMPLETE METABOLIC PANEL WITH GFR
AG Ratio: 1.6 (calc) (ref 1.0–2.5)
ALT: 13 U/L (ref 6–29)
AST: 17 U/L (ref 10–35)
Albumin: 4.5 g/dL (ref 3.6–5.1)
Alkaline phosphatase (APISO): 73 U/L (ref 33–130)
BUN/Creatinine Ratio: 14 (calc) (ref 6–22)
BUN: 15 mg/dL (ref 7–25)
CALCIUM: 10.4 mg/dL (ref 8.6–10.4)
CO2: 28 mmol/L (ref 20–32)
CREATININE: 1.05 mg/dL — AB (ref 0.60–0.93)
Chloride: 101 mmol/L (ref 98–110)
GFR, EST NON AFRICAN AMERICAN: 53 mL/min/{1.73_m2} — AB (ref >=60)
GFR, Est African American: 61 mL/min/{1.73_m2} (ref >=60)
GLOBULIN: 2.8 g/dL (ref 1.9–3.7)
Glucose, Bld: 95 mg/dL (ref 65–99)
Potassium: 4.5 mmol/L (ref 3.5–5.3)
SODIUM: 140 mmol/L (ref 135–146)
Total Bilirubin: 0.4 mg/dL (ref 0.2–1.2)
Total Protein: 7.3 g/dL (ref 6.1–8.1)

## 2017-01-18 NOTE — Patient Instructions (Signed)
Standing Labs We placed an order today for your standing lab work.    Please come back and get your standing labs in January and every 3 months  We have open lab Monday through Friday from 8:30-11:30 AM and 1:30-4 PM at the office of Dr. Jelina Paulsen.   The office is located at [address], [address], [place], [place] [address] No appointment is necessary.   Labs are drawn by [facility].  You may receive a bill from [facility] for your lab work. If you have any questions regarding directions or hours of operation,  please call [phone].    

## 2017-01-19 ENCOUNTER — Telehealth: Payer: Self-pay | Admitting: *Deleted

## 2017-01-19 NOTE — Progress Notes (Signed)
Labs are stable.

## 2017-01-19 NOTE — Telephone Encounter (Signed)
Ft message to advise patient her x-rays showed OA and Ra. Mild to moderate. No changes and no erosions

## 2017-02-01 ENCOUNTER — Ambulatory Visit: Payer: Medicare Other | Admitting: Orthotics

## 2017-02-01 DIAGNOSIS — M2142 Flat foot [pes planus] (acquired), left foot: Principal | ICD-10-CM

## 2017-02-01 DIAGNOSIS — M2141 Flat foot [pes planus] (acquired), right foot: Secondary | ICD-10-CM

## 2017-02-01 DIAGNOSIS — M19072 Primary osteoarthritis, left ankle and foot: Secondary | ICD-10-CM

## 2017-02-01 DIAGNOSIS — M19071 Primary osteoarthritis, right ankle and foot: Secondary | ICD-10-CM

## 2017-02-01 NOTE — Progress Notes (Signed)
Patient brought f/o back for modifications: 1) shallow heel cup 2) horseshoe cushion cup 3) spenco cover 4) add ppt 1/16 padding

## 2017-02-24 ENCOUNTER — Ambulatory Visit: Payer: Medicare Other | Admitting: Orthotics

## 2017-02-24 DIAGNOSIS — M2141 Flat foot [pes planus] (acquired), right foot: Secondary | ICD-10-CM

## 2017-02-24 DIAGNOSIS — M2142 Flat foot [pes planus] (acquired), left foot: Principal | ICD-10-CM

## 2017-02-25 NOTE — Progress Notes (Signed)
Picked upcorrected  f/o; seemed pleased with fit and function.

## 2017-04-20 ENCOUNTER — Other Ambulatory Visit: Payer: Self-pay | Admitting: Rheumatology

## 2017-04-20 NOTE — Telephone Encounter (Signed)
Last Visit: 01/18/17 Next visit: 07/20/17 Labs: 01/18/17 Stable PLQ Eye Exam: 04/09/16 WNL. Patient states she updated 04/14/17 WNL. Patient states eye Dr. Aletha Halim send a copy.   Okay to refill per Dr. Estanislado Pandy

## 2017-04-22 ENCOUNTER — Other Ambulatory Visit: Payer: Self-pay | Admitting: *Deleted

## 2017-04-22 ENCOUNTER — Other Ambulatory Visit: Payer: Self-pay

## 2017-04-22 DIAGNOSIS — Z79899 Other long term (current) drug therapy: Secondary | ICD-10-CM

## 2017-04-22 LAB — CBC WITH DIFFERENTIAL/PLATELET
BASOS ABS: 49 {cells}/uL (ref 0–200)
Basophils Relative: 1 %
Eosinophils Absolute: 289 cells/uL (ref 15–500)
Eosinophils Relative: 5.9 %
HEMATOCRIT: 34.6 % — AB (ref 35.0–45.0)
HEMOGLOBIN: 11.5 g/dL — AB (ref 11.7–15.5)
LYMPHS ABS: 1524 {cells}/uL (ref 850–3900)
MCH: 29 pg (ref 27.0–33.0)
MCHC: 33.2 g/dL (ref 32.0–36.0)
MCV: 87.4 fL (ref 80.0–100.0)
MPV: 10.1 fL (ref 7.5–12.5)
Monocytes Relative: 11.1 %
NEUTROS PCT: 50.9 %
Neutro Abs: 2494 cells/uL (ref 1500–7800)
Platelets: 274 10*3/uL (ref 140–400)
RBC: 3.96 10*6/uL (ref 3.80–5.10)
RDW: 12.7 % (ref 11.0–15.0)
Total Lymphocyte: 31.1 %
WBC: 4.9 10*3/uL (ref 3.8–10.8)
WBCMIX: 544 {cells}/uL (ref 200–950)

## 2017-04-22 LAB — COMPLETE METABOLIC PANEL WITH GFR
AG Ratio: 1.7 (calc) (ref 1.0–2.5)
ALBUMIN MSPROF: 4.3 g/dL (ref 3.6–5.1)
ALT: 18 U/L (ref 6–29)
AST: 19 U/L (ref 10–35)
Alkaline phosphatase (APISO): 72 U/L (ref 33–130)
BUN / CREAT RATIO: 15 (calc) (ref 6–22)
BUN: 16 mg/dL (ref 7–25)
CO2: 30 mmol/L (ref 20–32)
CREATININE: 1.05 mg/dL — AB (ref 0.60–0.93)
Calcium: 10 mg/dL (ref 8.6–10.4)
Chloride: 101 mmol/L (ref 98–110)
GFR, EST AFRICAN AMERICAN: 61 mL/min/{1.73_m2} (ref >=60)
GFR, Est Non African American: 53 mL/min/{1.73_m2} — ABNORMAL LOW (ref >=60)
GLUCOSE: 123 mg/dL — AB (ref 65–99)
Globulin: 2.6 g/dL (calc) (ref 1.9–3.7)
Potassium: 4.1 mmol/L (ref 3.5–5.3)
Sodium: 138 mmol/L (ref 135–146)
Total Bilirubin: 0.4 mg/dL (ref 0.2–1.2)
Total Protein: 6.9 g/dL (ref 6.1–8.1)

## 2017-07-06 NOTE — Progress Notes (Deleted)
Office Visit Note  Patient: Sally Diaz             Date of Birth: [birth year]           MRN: [id number]             PCP: Bernerd Limbo, MD Referring: Bernerd Limbo, MD Visit Date: 07/20/2017 Occupation: @GUAROCC @    Subjective:  No chief complaint on file.   History of Present Illness: Sally Diaz is a 72 y.o. female ***   Activities of Daily Living:  Patient reports morning stiffness for *** {minute/hour:19697}.   Patient {ACTIONS;DENIES/REPORTS:21021675::"Denies"} nocturnal pain.  Difficulty dressing/grooming: {ACTIONS;DENIES/REPORTS:21021675::"Denies"} Difficulty climbing stairs: {ACTIONS;DENIES/REPORTS:21021675::"Denies"} Difficulty getting out of chair: {ACTIONS;DENIES/REPORTS:21021675::"Denies"} Difficulty using hands for taps, buttons, cutlery, and/or writing: {ACTIONS;DENIES/REPORTS:21021675::"Denies"}   No Rheumatology ROS completed.   PMFS History:  Patient Active Problem List   Diagnosis Date Noted  . Acute midline low back pain without sciatica 06/17/2016  . High risk medication use 03/02/2016  . DDD (degenerative disc disease), lumbar 03/02/2016  . 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 Osteoarthritis, knee 03/02/2016  . Acute bronchitis 07/17/2012  . 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[place]) 11/11/2008  . History of basal cell carcinoma 06/23/2007  . Hypothyroidism 06/23/2007  . HYPERCHOLESTEROLEMIA 06/23/2007  . Essential hypertension 06/23/2007  . Seasonal and perennial allergic rhinitis 06/23/2007  . OBESITY, UNSPECIFIED 05/04/2007  . ESOPHAGITIS, REFLUX 06/04/2005  . DIVERTICULOSIS, COLON 06/04/2005    Past Medical History:  Diagnosis Date  . Allergy, unspecified not elsewhere classified   . Anemia, iron deficiency   . Basal cell carcinoma   . Diverticulosis of colon   . Hx of adenomatous colonic polyps   . Hypercholesterolemia   . Hypertension   . Hypothyroidism   . Obesity   . Reflux esophagitis   . Rheumatoid  arthritis ([place])     Family History  Problem Relation Age of Onset  . Colon cancer Cousin        Maternal Side  . Diabetes Maternal Grandmother   . Aneurysm Maternal Grandmother        brain  . Heart disease Mother   . Bladder Cancer Mother   . Stroke Father   . Pneumonia Father   . Lung cancer Brother   . AAA (abdominal aortic aneurysm) Brother    Past Surgical History:  Procedure Laterality Date  . CATARACT EXTRACTION, BILATERAL    . CERVICAL CONE BIOPSY    . ENDOMETRIAL ABLATION    . TONSILLECTOMY     Social History   Social History Narrative  . Not on file     Objective: Vital Signs: There were no vitals taken for this visit.   Physical Exam   Musculoskeletal Exam: ***  CDAI Exam: No CDAI exam completed.    Investigation: No additional findings.PLQ eye exam: 04/14/2017 CBC Latest Ref Rng & Units 04/22/2017 01/18/2017 10/04/2016  WBC 3.8 - 10.8 Thousand/uL 4.9 5.7 6.4  Hemoglobin 11.7 - 15.5 g/dL 11.5(L) 11.9 11.7  Hematocrit 35.0 - 45.0 % 34.6(L) 35.4 35.2  Platelets 140 - 400 Thousand/uL 274 282 341   CMP Latest Ref Rng & Units 04/22/2017 01/18/2017 10/04/2016  Glucose 65 - 99 mg/dL 123(H) 95 97  BUN 7 - 25 mg/dL 16 15 14   Creatinine 0.60 - 0.93 mg/dL 1.05(H) 1.05(H) 1.05(H)  Sodium 135 - 146 mmol/L 138 140 139  Potassium 3.5 - 5.3 mmol/L 4.1 4.5 4.7  Chloride 98 - 110 mmol/L 101 101 102  CO2 20 - 32  mmol/L 30 28 27   Calcium 8.6 - 10.4 mg/dL 10.0 10.4 10.2  Total Protein 6.1 - 8.1 g/dL 6.9 7.3 7.5  Total Bilirubin 0.2 - 1.2 mg/dL 0.4 0.4 0.4  Alkaline Phos 33 - 130 U/L - - 84  AST 10 - 35 U/L 19 17 19   ALT 6 - 29 U/L 18 13 15     Imaging: No results found.  Speciality Comments: PLQ eye exam: 04/14/2017 Normal. Dr. Katy Fitch. Follow up in 1 year.    Procedures:  No procedures performed Allergies: Latex; Methotrexate; Methotrexate derivatives; Neomycin-bacitracin zn-polymyx; and Tetracycline   Assessment / Plan:     Visit Diagnoses: No diagnosis  found.    Orders: No orders of the defined types were placed in this encounter.  No orders of the defined types were placed in this encounter.   Face-to-face time spent with patient was *** minutes. 50% of time was spent in counseling and coordination of care.  Follow-Up Instructions: No follow-ups on file.   Earnestine Mealing, CMA  Note - This record has been created using Editor, commissioning.  Chart creation errors have been sought, but may not always  have been located. Such creation errors do not reflect on  the standard of medical care.

## 2017-07-20 ENCOUNTER — Ambulatory Visit: Payer: Medicare Other | Admitting: Rheumatology

## 2017-11-25 ENCOUNTER — Other Ambulatory Visit: Payer: Self-pay | Admitting: Otolaryngology

## 2017-11-25 DIAGNOSIS — R430 Anosmia: Secondary | ICD-10-CM

## 2017-12-08 ENCOUNTER — Ambulatory Visit
Admission: RE | Admit: 2017-12-08 | Discharge: 2017-12-08 | Disposition: A | Discharge status: Home / Self Care | Payer: Medicare Other | Source: Ambulatory Visit | Attending: Otolaryngology | Admitting: Otolaryngology

## 2017-12-08 ENCOUNTER — Other Ambulatory Visit: Payer: Medicare Other

## 2017-12-08 DIAGNOSIS — R430 Anosmia: Secondary | ICD-10-CM

## 2017-12-08 MED ORDER — GADOBENATE DIMEGLUMINE 529 MG/ML IV SOLN
18.0000 mL | Freq: Once | INTRAVENOUS | Status: AC | PRN
  Administered 2017-12-08: 18 mL via INTRAVENOUS

## 2018-02-22 ENCOUNTER — Telehealth: Payer: Self-pay | Admitting: Podiatry

## 2018-02-22 NOTE — Telephone Encounter (Signed)
I need to pick up my x-rays. Pt. Is aware of fee and that she will need to medical release. Will pick up Friday in the morning 12/6

## 2018-02-23 NOTE — Telephone Encounter (Signed)
Disc of x-rays have been placed up front for pt to pick up when she comes in to sign her release form.

## 2018-02-24 DIAGNOSIS — M79676 Pain in unspecified toe(s): Secondary | ICD-10-CM

## 2018-12-11 ENCOUNTER — Telehealth: Payer: Self-pay | Admitting: Gastroenterology

## 2018-12-12 NOTE — Telephone Encounter (Signed)
No answer. Left a message on the voicemail.

## 2018-12-12 NOTE — Telephone Encounter (Signed)
Spoke with the patient. She reports spontaneous vomiting. Reflux and a sensation of food not going down when swallowed. She feels like she is choking. She is "afraid I have cancer." Appointment scheduled.

## 2018-12-12 NOTE — Telephone Encounter (Signed)
Pt calling regarding this msg.  °

## 2018-12-22 ENCOUNTER — Encounter: Payer: Self-pay | Admitting: Gastroenterology

## 2018-12-22 ENCOUNTER — Ambulatory Visit: Payer: Medicare Other | Admitting: Gastroenterology

## 2018-12-22 VITALS — BP 160/80 | HR 80 | Temp 97.6°F | Ht 63.0 in | Wt 183.0 lb

## 2018-12-22 DIAGNOSIS — R1013 Epigastric pain: Secondary | ICD-10-CM

## 2018-12-22 DIAGNOSIS — K219 Gastro-esophageal reflux disease without esophagitis: Secondary | ICD-10-CM | POA: Diagnosis not present

## 2018-12-22 DIAGNOSIS — R1310 Dysphagia, unspecified: Secondary | ICD-10-CM | POA: Diagnosis not present

## 2018-12-22 MED ORDER — FAMOTIDINE 20 MG PO TABS: 20.0000 mg | ORAL_TABLET | Freq: Two times a day (BID) | ORAL | 3 refills | Status: DC

## 2018-12-22 NOTE — Progress Notes (Signed)
Sally Diaz    NO:[id number]    Dec 22, 1945  Primary Care Physician:Sally Diaz, Sally Brow, MD  Referring Physician: Bernerd Limbo, MD Sally Diaz,  [place] [address]   Chief complaint:  Difficulty swallowing, regurgitation  HPI: 73 year old female here with complaints of intermittent dysphagia, regurgitation of mucus/thick liquid worse in the past few months.  She also has intermittent burning in her throat.  She is currently taking Pepcid as needed, previously she was on Zantac.  He has sensation of indigestion and dyspepsia associated with excessive bloating. She feels food gets hung up in her throat and she has to regurgitate it back up or wait for some time for it to pass.  Her weight is stable.  No abdominal pain, melena or blood per rectum.  EGD  06/04/2005 by Dr. Olevia Perches: Reflux esophagitis, biopsies negative for intestinal metaplasia  Colonoscopy 06/07/2016 Left-sided colon diverticulosis, 11 mm polyp ascending colon [tubular adenoma] and [4 mm polyp] transverse colon removed  Outpatient Encounter Medications as of 12/22/2018  Medication Sig  . albuterol (PROVENTIL HFA;VENTOLIN HFA) 108 (90 BASE) MCG/ACT inhaler Use 2 puffs every 4-6 hours as needed  . chlorthalidone (HYGROTON) 25 MG tablet Take 25 mg by mouth as needed.   . Cholecalciferol (VITAMIN D3) 5000 units TBDP Take 1 tablet by mouth daily.  . clobetasol ointment (TEMOVATE) 0.05 % as needed.   Marland Kitchen Co-Enzyme Q-10 30 MG CAPS Take 100 mg by mouth.  . famotidine (PEPCID) 10 MG tablet Take 10 mg by mouth 2 (two) times daily.  Marland Kitchen golimumab 2 mg/kg in sodium chloride 0.9 % Inject 2 mg/kg into the vein every 8 (eight) weeks.  . hydroxychloroquine (PLAQUENIL) 200 MG tablet TAKE 2 TABLETS MONDAY THROUGH FRIDAY BUT NONE ON SATURDAY OR SUNDAY.  Marland Kitchen LIOTHYRONINE SODIUM PO Take 1 tablet by mouth daily. T4, T3 compounded medication  . loratadine (CLARITIN) 10 MG tablet Take 10 mg by mouth as needed.   .  Misc Natural Products (TART CHERRY ADVANCED PO) Take 1 tablet by mouth 2 (two) times daily.   . NON FORMULARY Progesterone compound 25mg  take 1 tablet daily  . Nutritional Supplements (DHEA) 15-50 MG CAPS Take 15 mg by mouth daily.  . vitamin E 400 UNIT capsule Take by mouth.  . fish oil-omega-3 fatty acids 1000 MG capsule Take 2 capsules by mouth daily. 1 daily  . Probiotic Product (PROBIOTIC-10 PO) Take by mouth.  . [DISCONTINUED] calcium gluconate 500 MG tablet Take 1 tablet by mouth 2 (two) times daily.  . [DISCONTINUED] Glutathione 50 MG TABS Take by mouth.  . [DISCONTINUED] irbesartan (AVAPRO) 300 MG tablet Take 150 mg by mouth daily.    . [DISCONTINUED] meloxicam (MOBIC) 7.5 MG tablet Take 7.5 mg by mouth daily.  . [DISCONTINUED] predniSONE (DELTASONE) 5 MG tablet 4 tablets for 4 days, 3 for 4 days, 2 for 4 days, 1 for 4 days, half for 4 days (Patient not taking: Reported on 01/18/2017)  . [DISCONTINUED] Pregnenolone POWD Take 2 mg by mouth daily.  . [DISCONTINUED] ranitidine (ZANTAC 75) 75 MG tablet Take 1 tablet (75 mg total) by mouth 2 (two) times daily.  . [DISCONTINUED] tiZANidine (ZANAFLEX) 4 MG tablet Every 8 hours as needed for muscle spasms for up to 10 days then reduce dose to daily as needed. (Patient not taking: Reported on 06/23/2016)   Facility-Administered Encounter Medications as of 12/22/2018  Medication  . 0.9 %  sodium chloride infusion  Allergies as of 12/22/2018 - Review Complete 01/18/2017  Allergen Reaction Noted  . Latex Rash 06/07/2016  . Methotrexate Rash 08/17/2016  . Methotrexate derivatives Rash 08/17/2016  . Neomycin-bacitracin zn-polymyx Rash   . Tetracycline Swelling     Past Medical History:  Diagnosis Date  . Allergy, unspecified not elsewhere classified   . Anemia, iron deficiency   . Basal cell carcinoma   . Diverticulosis of colon   . Hx of adenomatous colonic polyps   . Hypercholesterolemia   . Hypertension   . Hypothyroidism   .  Obesity   . Reflux esophagitis   . Rheumatoid arthritis [facility])     Past Surgical History:  Procedure Laterality Date  . CATARACT EXTRACTION, BILATERAL    . CERVICAL CONE BIOPSY    . ENDOMETRIAL ABLATION    . TONSILLECTOMY      Family History  Problem Relation Age of Onset  . Colon cancer Cousin        Maternal Side  . Diabetes Maternal Grandmother   . Aneurysm Maternal Grandmother        brain  . Heart disease Mother   . Bladder Cancer Mother   . Stroke Father   . Pneumonia Father   . Lung cancer Brother   . AAA (abdominal aortic aneurysm) Brother     Social History   Socioeconomic History  . Marital status: Married    Spouse name: Not on file  . Number of children: Not on file  . Years of education: Not on file  . Highest education level: Not on file  Occupational History  . Occupation: Retired  Scientific laboratory technician  . Financial resource strain: Not on file  . Food insecurity    Worry: Not on file    Inability: Not on file  . Transportation needs    Medical: Not on file    Non-medical: Not on file  Tobacco Use  . Smoking status: Never Smoker  . Smokeless tobacco: Never Used  Substance and Sexual Activity  . Alcohol use: No  . Drug use: No  . Sexual activity: Not on file  Lifestyle  . Physical activity    Days per week: Not on file    Minutes per session: Not on file  . Stress: Not on file  Relationships  . Social Herbalist on phone: Not on file    Gets together: Not on file    Attends religious service: Not on file    Active member of club or organization: Not on file    Attends meetings of clubs or organizations: Not on file    Relationship status: Not on file  . Intimate partner violence    Fear of current or ex partner: Not on file    Emotionally abused: Not on file    Physically abused: Not on file    Forced sexual activity: Not on file  Other Topics Concern  . Not on file  Social History Narrative  . Not on file      Review of  systems: Review of Systems  Constitutional: Negative for fever and chills. Positive for lack of energy HENT: Positive for sinus problem Eyes: Negative for blurred vision.  Respiratory: Negative for cough, shortness of breath and positive for wheezing.   Cardiovascular: Negative for chest pain and palpitations.  Gastrointestinal: as per HPI Genitourinary: Negative for dysuria, urgency, frequency and hematuria.  Musculoskeletal: Positive for myalgias, back pain and joint pain.  Skin: Negative for itching and  rash.  Neurological: Negative for dizziness, tremors, focal weakness, seizures and loss of consciousness.  Endo/Heme/Allergies: Positive for seasonal allergies.  Psychiatric/Behavioral: Negative for depression, suicidal ideas and hallucinations.  All other systems reviewed and are negative.   Physical Exam: Vitals:   12/22/18 1522  BP: (!) 160/80  Pulse: 80  Temp: 97.6 F (36.4 C)   Body mass index is 32.42 kg/m. Gen:      No acute distress HEENT:  EOMI, sclera anicteric Neck:     No masses; no thyromegaly Lungs:    Clear to auscultation bilaterally; normal respiratory effort CV:         Regular rate and rhythm; no murmurs Abd:      + bowel sounds; soft, non-tender; no palpable masses, no distension Ext:    No edema; adequate peripheral perfusion Skin:      Warm and dry; no rash Neuro: alert and oriented x 3 Psych: normal mood and affect  Data Reviewed:  Reviewed labs, radiology imaging, old records and pertinent past GI work up   Assessment and Plan/Recommendations:  73 year old with history of hypertension, hypothyroidism, hyperlipidemia, obesity and chronic GERD with complaints of intermittent solid dysphagia Schedule for EGD for evaluation, esophageal biopsies and possible dilation if needed  Continue Pepcid daily as needed for GERD Discussed antireflux measures and lifestyle modifications in detail  Trial of FD guard 1 capsule up to 3 times daily as needed  for dyspepsia  The risks and benefits as well as alternatives of endoscopic procedure(s) have been discussed and reviewed. All questions answered. The patient agrees to proceed.   Damaris Hippo , MD    CC: Sally Limbo, MD

## 2018-12-22 NOTE — Patient Instructions (Signed)
You have been scheduled for an endoscopy. Please follow written instructions given to you at your visit today. If you use inhalers (even only as needed), please bring them with you on the day of your procedure.   If you are age 74 or older, your body mass index should be between 23-30. Your Body mass index is 32.42 kg/m. If this is out of the aforementioned range listed, please consider follow up with your Primary Care Provider.  If you are age 65 or younger, your body mass index should be between 19-25. Your Body mass index is 32.42 kg/m. If this is out of the aformentioned range listed, please consider follow up with your Primary Care Provider.    We will send in Pepcid to your pharmacy  Take FDGard 1 capsule three times a day as needed  I appreciate the  opportunity to care for you  Thank You   Harl Bowie , MD

## 2018-12-25 ENCOUNTER — Encounter: Payer: Self-pay | Admitting: Gastroenterology

## 2018-12-26 ENCOUNTER — Telehealth: Payer: Self-pay

## 2018-12-26 NOTE — Telephone Encounter (Signed)
Pt responded "no" to all screening questions °

## 2018-12-26 NOTE — Telephone Encounter (Signed)
Covid-19 screening questions   Do you now or have you had a fever in the last 14 days?  Do you have any respiratory symptoms of shortness of breath or cough now or in the last 14 days?  Do you have any family members or close contacts with diagnosed or suspected Covid-19 in the past 14 days?  Have you been tested for Covid-19 and found to be positive?       

## 2018-12-27 ENCOUNTER — Encounter: Payer: Self-pay | Admitting: Gastroenterology

## 2018-12-27 ENCOUNTER — Other Ambulatory Visit: Payer: Self-pay

## 2018-12-27 ENCOUNTER — Ambulatory Visit (AMBULATORY_SURGERY_CENTER): Payer: Medicare Other | Admitting: Gastroenterology

## 2018-12-27 VITALS — BP 163/79 | HR 78 | Temp 97.9°F | Resp 16 | Ht 63.0 in | Wt 183.0 lb

## 2018-12-27 DIAGNOSIS — K449 Diaphragmatic hernia without obstruction or gangrene: Secondary | ICD-10-CM

## 2018-12-27 DIAGNOSIS — K228 Other specified diseases of esophagus: Secondary | ICD-10-CM

## 2018-12-27 DIAGNOSIS — R1310 Dysphagia, unspecified: Secondary | ICD-10-CM | POA: Diagnosis not present

## 2018-12-27 DIAGNOSIS — K2970 Gastritis, unspecified, without bleeding: Secondary | ICD-10-CM | POA: Diagnosis not present

## 2018-12-27 DIAGNOSIS — K2100 Gastro-esophageal reflux disease with esophagitis, without bleeding: Secondary | ICD-10-CM | POA: Diagnosis not present

## 2018-12-27 DIAGNOSIS — K3189 Other diseases of stomach and duodenum: Secondary | ICD-10-CM | POA: Diagnosis not present

## 2018-12-27 MED ORDER — OMEPRAZOLE 40 MG PO CPDR: 40.0000 mg | DELAYED_RELEASE_CAPSULE | Freq: Every day | ORAL | 3 refills | Status: DC

## 2018-12-27 MED ORDER — SODIUM CHLORIDE 0.9 % IV SOLN: 500.0000 mL | Freq: Once | INTRAVENOUS | Status: DC

## 2018-12-27 NOTE — Patient Instructions (Signed)
Please read handouts provided. Continue present medications. Use Prilosec ( omeprazole ) 40 mg daily for 3 months. Follow an anti-reflux regimen. Await pathology results. No ibuprofen, naproxen, or other non-steriodal anti-inflammatory drugs.        YOU HAD AN ENDOSCOPIC PROCEDURE TODAY AT [facility]:   Refer to the procedure report that was given to you for any specific questions about what was found during the examination.  If the procedure report does not answer your questions, please call your gastroenterologist to clarify.  If you requested that your care partner not be given the details of your procedure findings, then the procedure report has been included in a sealed envelope for you to review at your convenience later.  YOU SHOULD EXPECT: Some feelings of bloating in the abdomen. Passage of more gas than usual.  Walking can help get rid of the air that was put into your GI tract during the procedure and reduce the bloating. If you had a lower endoscopy (such as a colonoscopy or flexible sigmoidoscopy) you may notice spotting of blood in your stool or on the toilet paper. If you underwent a bowel prep for your procedure, you may not have a normal bowel movement for a few days.  Please Note:  You might notice some irritation and congestion in your nose or some drainage.  This is from the oxygen used during your procedure.  There is no need for concern and it should clear up in a day or so.  SYMPTOMS TO REPORT IMMEDIATELY:     Following upper endoscopy (EGD)  Vomiting of blood or coffee ground material  New chest pain or pain under the shoulder blades  Painful or persistently difficult swallowing  New shortness of breath  Fever of 100F or higher  Black, tarry-looking stools  For urgent or emergent issues, a gastroenterologist can be reached at any hour by calling [phone].   DIET:  We do recommend a small meal at first, but then you may proceed to  your regular diet.  Drink plenty of fluids but you should avoid alcoholic beverages for 24 hours.  ACTIVITY:  You should plan to take it easy for the rest of today and you should NOT DRIVE or use heavy machinery until tomorrow (because of the sedation medicines used during the test).    FOLLOW UP: Our staff will call the number listed on your records 48-72 hours following your procedure to check on you and address any questions or concerns that you may have regarding the information given to you following your procedure. If we do not reach you, we will leave a message.  We will attempt to reach you two times.  During this call, we will ask if you have developed any symptoms of COVID 19. If you develop any symptoms (ie: fever, flu-like symptoms, shortness of breath, cough etc.) before then, please call [phone].  If you test positive for Covid 19 in the 2 weeks post procedure, please call and report this information to [place].    If any biopsies were taken you will be contacted by phone or by letter within the next 1-3 weeks.  Please call us at [phone] if you have not heard about the biopsies in 3 weeks.    SIGNATURES/CONFIDENTIALITY: You and/or your care partner have signed paperwork which will be entered into your electronic medical record.  These signatures attest to the fact that that the information above on your After Visit Summary has been reviewed  and is understood.  Full responsibility of the confidentiality of this discharge information lies with you and/or your care-partner. 

## 2018-12-27 NOTE — Op Note (Addendum)
[facility] Patient Name: Sally Diaz Procedure Date: 12/27/2018 1:55 PM MRN: [id number] Endoscopist: Mauri Pole , MD Age: 73 Referring MD:  Date of Birth: [birth year] Gender: Female Account #: [id number] Procedure:                Upper GI endoscopy Indications:              Dysphagia Medicines:                Monitored Anesthesia Care Procedure:                Pre-Anesthesia Assessment:                           - Prior to the procedure, a History and Physical                            was performed, and patient medications and                            allergies were reviewed. The patient's tolerance of                            previous anesthesia was also reviewed. The risks                            and benefits of the procedure and the sedation                            options and risks were discussed with the patient.                            All questions were answered, and informed consent                            was obtained. Prior Anticoagulants: The patient has                            taken no previous anticoagulant or antiplatelet                            agents. ASA Grade Assessment: II - A patient with                            mild systemic disease. After reviewing the risks                            and benefits, the patient was deemed in                            satisfactory condition to undergo the procedure.                           After obtaining informed consent, the endoscope was  passed under direct vision. Throughout the                            procedure, the patient's blood pressure, pulse, and                            oxygen saturations were monitored continuously. The                            Endoscope was introduced through the mouth, and                            advanced to the second part of duodenum. The upper                            GI endoscopy was accomplished without  difficulty.                            The patient tolerated the procedure well. Scope In: Scope Out: Findings:                 LA Grade C (one or more mucosal breaks continuous                            between tops of 2 or more mucosal folds, less than                            75% circumference) esophagitis with bleeding was                            found 35 to 36 cm from the incisors.                           Segmental moderate mucosal changes characterized by                            friability (with spontaneous bleeding), granularity                            and altered texture were found at the                            gastroesophageal junction. Biopsies were taken with                            a cold forceps for histology.                           One moderate stenosis was found 35 to 36 cm from                            the incisors. This stenosis measured less than one  cm (in length). The stenosis was traversed.                            Dilation not performed due to mucosal changes                            adjacent to the stricture.                           A small hiatal hernia was present.                           Patchy mild inflammation characterized by                            congestion (edema) and erythema was found in the                            entire examined stomach. Biopsies were taken with a                            cold forceps for Helicobacter pylori testing.                           The examined duodenum was normal. Complications:            No immediate complications. Estimated Blood Loss:     Estimated blood loss was minimal. Impression:               - LA Grade C erosive esophagitis.                           Dionisio David (with spontaneous bleeding), granular,                            texture changed mucosa in the esophagus. Biopsied.                           - Esophageal stenosis.                            - Small hiatal hernia.                           - Gastritis. Biopsied.                           - Normal examined duodenum. Recommendation:           - Patient has a contact number available for                            emergencies. The signs and symptoms of potential                            delayed complications were discussed with the  patient. Return to normal activities tomorrow.                            Written discharge instructions were provided to the                            patient.                           - Resume previous diet.                           - Continue present medications.                           - Use Prilosec (omeprazole) 40 mg PO daily for 3                            months with 3 refills.                           - Follow an antireflux regimen.                           - No ibuprofen, naproxen, or other non-steroidal                            anti-inflammatory drugs.                           - Await pathology results.                           - Repeat upper endoscopy after studies are complete                            for surveillance based on pathology results. Mauri Pole, MD 12/27/2018 2:28:20 PM This report has been signed electronically.

## 2018-12-27 NOTE — Progress Notes (Signed)
PT taken to PACU. Monitors in place. VSS. Report given to RN. 

## 2018-12-27 NOTE — Progress Notes (Signed)
Called to room to assist during endoscopic procedure.  Patient ID and intended procedure confirmed with present staff. Received instructions for my participation in the procedure from the performing physician.  

## 2018-12-29 ENCOUNTER — Telehealth: Payer: Self-pay | Admitting: *Deleted

## 2018-12-29 ENCOUNTER — Encounter: Payer: Self-pay | Admitting: Gastroenterology

## 2018-12-29 NOTE — Telephone Encounter (Signed)
  Follow up Call-  Call back number 12/27/2018 06/07/2016  Post procedure Call Back phone  # [phone]  Permission to leave phone message Yes Yes  Some recent data might be hidden     Patient questions:  Do you have a fever, pain , or abdominal swelling? No. Pain Score  0 *  Have you tolerated food without any problems? yes  Have you been able to return to your normal activities? Yes  Do you have any questions about your discharge instructions: Diet   No. Medications  No. Follow up visit  No.  Do you have questions or concerns about your Care? No.  Actions: * If pain score is 4 or above: No action needed, pain <4.  1. Have you developed a fever since your procedure? NO  2.   Have you had an respiratory symptoms (SOB or cough) since your procedure? NO  3.   Have you tested positive for COVID 19 since your procedure NO  4.   Have you had any family members/close contacts diagnosed with the COVID 19 since your procedure?  NO   If yes to any of these questions please route to Joylene John, RN and Alphonsa Gin, RN.

## 2019-01-11 ENCOUNTER — Telehealth: Payer: Self-pay | Admitting: Gastroenterology

## 2019-01-11 NOTE — Telephone Encounter (Signed)
Letter mailed on 01/04/19. She has not received the letter. Reviewed with the patient and scheduled the follow up appointment.

## 2019-01-11 NOTE — Telephone Encounter (Signed)
Pt inquired about her EGD results.

## 2019-02-09 ENCOUNTER — Encounter: Payer: Self-pay | Admitting: Gastroenterology

## 2019-02-09 ENCOUNTER — Other Ambulatory Visit: Payer: Self-pay

## 2019-02-09 ENCOUNTER — Ambulatory Visit: Payer: Medicare Other | Admitting: Gastroenterology

## 2019-02-09 VITALS — BP 120/80 | HR 79 | Temp 97.6°F | Ht 63.0 in | Wt 192.0 lb

## 2019-02-09 DIAGNOSIS — R1310 Dysphagia, unspecified: Secondary | ICD-10-CM | POA: Diagnosis not present

## 2019-02-09 DIAGNOSIS — K2100 Gastro-esophageal reflux disease with esophagitis, without bleeding: Secondary | ICD-10-CM | POA: Diagnosis not present

## 2019-02-09 DIAGNOSIS — Z1159 Encounter for screening for other viral diseases: Secondary | ICD-10-CM | POA: Diagnosis not present

## 2019-02-09 NOTE — Patient Instructions (Signed)
If you are age 74 or older, your body mass index should be between 23-30. Your Body mass index is 34.01 kg/m. If this is out of the aforementioned range listed, please consider follow up with your Primary Care Provider.  If you are age 33 or younger, your body mass index should be between 19-25. Your Body mass index is 34.01 kg/m. If this is out of the aformentioned range listed, please consider follow up with your Primary Care Provider.    You have been scheduled for an endoscopy. Please follow written instructions given to you at your visit today. If you use inhalers (even only as needed), please bring them with you on the day of your procedure.    Gastroesophageal Reflux Disease, Adult Gastroesophageal reflux (GER) happens when acid from the stomach flows up into the tube that connects the mouth and the stomach (esophagus). Normally, food travels down the esophagus and stays in the stomach to be digested. However, when a person has GER, food and stomach acid sometimes move back up into the esophagus. If this becomes a more serious problem, the person may be diagnosed with a disease called gastroesophageal reflux disease (GERD). GERD occurs when the reflux:  Happens often.  Causes frequent or severe symptoms.  Causes problems such as damage to the esophagus. When stomach acid comes in contact with the esophagus, the acid may cause soreness (inflammation) in the esophagus. Over time, GERD may create small holes (ulcers) in the lining of the esophagus. What are the causes? This condition is caused by a problem with the muscle between the esophagus and the stomach (lower esophageal sphincter, or LES). Normally, the LES muscle closes after food passes through the esophagus to the stomach. When the LES is weakened or abnormal, it does not close properly, and that allows food and stomach acid to go back up into the esophagus. The LES can be weakened by certain dietary substances, medicines, and  medical conditions, including:  Tobacco use.  Pregnancy.  Having a hiatal hernia.  Alcohol use.  Certain foods and beverages, such as coffee, chocolate, onions, and peppermint. What increases the risk? You are more likely to develop this condition if you:  Have an increased body weight.  Have a connective tissue disorder.  Use NSAID medicines. What are the signs or symptoms? Symptoms of this condition include:  Heartburn.  Difficult or painful swallowing.  The feeling of having a lump in the throat.  Abitter taste in the mouth.  Bad breath.  Having a large amount of saliva.  Having an upset or bloated stomach.  Belching.  Chest pain. Different conditions can cause chest pain. Make sure you see your health care provider if you experience chest pain.  Shortness of breath or wheezing.  Ongoing (chronic) cough or a night-time cough.  Wearing away of tooth enamel.  Weight loss. How is this diagnosed? Your health care provider will take a medical history and perform a physical exam. To determine if you have mild or severe GERD, your health care provider may also monitor how you respond to treatment. You may also have tests, including:  A test to examine your stomach and esophagus with a small camera (endoscopy).  A test thatmeasures the acidity level in your esophagus.  A test thatmeasures how much pressure is on your esophagus.  A barium swallow or modified barium swallow test to show the shape, size, and functioning of your esophagus. How is this treated? The goal of treatment is to help relieve  your symptoms and to prevent complications. Treatment for this condition may vary depending on how severe your symptoms are. Your health care provider may recommend:  Changes to your diet.  Medicine.  Surgery. Follow these instructions at home: Eating and drinking   Follow a diet as recommended by your health care provider. This may involve avoiding foods  and drinks such as: ? Coffee and tea (with or without caffeine). ? Drinks that containalcohol. ? Energy drinks and sports drinks. ? Carbonated drinks or sodas. ? Chocolate and cocoa. ? Peppermint and mint flavorings. ? Garlic and onions. ? Horseradish. ? Spicy and acidic foods, including peppers, chili powder, curry powder, vinegar, hot sauces, and barbecue sauce. ? Citrus fruit juices and citrus fruits, such as oranges, lemons, and limes. ? Tomato-based foods, such as red sauce, chili, salsa, and pizza with red sauce. ? Fried and fatty foods, such as donuts, french fries, potato chips, and high-fat dressings. ? High-fat meats, such as hot dogs and fatty cuts of red and white meats, such as rib eye steak, sausage, ham, and bacon. ? High-fat dairy items, such as whole milk, butter, and cream cheese.  Eat small, frequent meals instead of large meals.  Avoid drinking large amounts of liquid with your meals.  Avoid eating meals during the 2-3 hours before bedtime.  Avoid lying down right after you eat.  Do not exercise right after you eat. Lifestyle   Do not use any products that contain nicotine or tobacco, such as cigarettes, e-cigarettes, and chewing tobacco. If you need help quitting, ask your health care provider.  Try to reduce your stress by using methods such as yoga or meditation. If you need help reducing stress, ask your health care provider.  If you are overweight, reduce your weight to an amount that is healthy for you. Ask your health care provider for guidance about a safe weight loss goal. General instructions  Pay attention to any changes in your symptoms.  Take over-the-counter and prescription medicines only as told by your health care provider. Do not take aspirin, ibuprofen, or other NSAIDs unless your health care provider told you to do so.  Wear loose-fitting clothing. Do not wear anything tight around your waist that causes pressure on your  abdomen.  Raise (elevate) the head of your bed about 6 inches (15 cm).  Avoid bending over if this makes your symptoms worse.  Keep all follow-up visits as told by your health care provider. This is important. Contact a health care provider if:  You have: ? New symptoms. ? Unexplained weight loss. ? Difficulty swallowing or it hurts to swallow. ? Wheezing or a persistent cough. ? A hoarse voice.  Your symptoms do not improve with treatment. Get help right away if you:  Have pain in your arms, neck, jaw, teeth, or back.  Feel sweaty, dizzy, or light-headed.  Have chest pain or shortness of breath.  Vomit and your vomit looks like blood or coffee grounds.  Faint.  Have stool that is bloody or black.  Cannot swallow, drink, or eat. Summary  Gastroesophageal reflux happens when acid from the stomach flows up into the esophagus. GERD is a disease in which the reflux happens often, causes frequent or severe symptoms, or causes problems such as damage to the esophagus.  Treatment for this condition may vary depending on how severe your symptoms are. Your health care provider may recommend diet and lifestyle changes, medicine, or surgery.  Contact a health care provider if you  have new or worsening symptoms.  Take over-the-counter and prescription medicines only as told by your health care provider. Do not take aspirin, ibuprofen, or other NSAIDs unless your health care provider told you to do so.  Keep all follow-up visits as told by your health care provider. This is important. This information is not intended to replace advice given to you by your health care provider. Make sure you discuss any questions you have with your health care provider. Document Released: 12/16/2004 Document Revised: 09/14/2017 Document Reviewed: 09/14/2017 Elsevier Patient Education  [employer].   I appreciate the  opportunity to care for you  Thank You   Harl Bowie , MD

## 2019-02-09 NOTE — Progress Notes (Signed)
Sally Diaz    [id number]    1946/01/30  Primary Care Physician:Bouska, Shanon Brow, MD  Referring Physician: Bernerd Limbo, MD [address] [place],  [place] [address]   Chief complaint:  GERD  HPI: 73 year old with history of GERD, erosive esophagitis here for follow-up visit  It improved significantly after taking medicine. Occasional  Epigastric abd pain soone after EGD but none recently. Still has sore throat when she swallows. 90% better.  She has regained weight  She has a torn meniscus and is planning to have surgery   Intermittent constipation, has to go 2-3 times in the morning before she feels completely empty  Denies any melena, nausea, vomiting, abdominal pain or blood per rectum.  EGD December 27, 2018: LA grade C esophagitis and GE junction nodularity, biopsies showed ulcerative esophagitis, negative for CMV, HSV and yeast infection. Hiatal hernia and mild gastritis, biopsy showed mild chronic gastritis, negative for H. Pylori  EGD  06/04/2005 by Dr. Olevia Perches: Reflux esophagitis, biopsies negative for intestinal metaplasia  Colonoscopy 06/07/2016 Left-sided colon diverticulosis, 11 mm polyp ascending colon [tubular adenoma] and 4 mm hyperplastic polyp transverse colon removed  Outpatient Encounter Medications as of 02/09/2019  Medication Sig  . albuterol (PROVENTIL HFA;VENTOLIN HFA) 108 (90 BASE) MCG/ACT inhaler Use 2 puffs every 4-6 hours as needed  . Cholecalciferol (VITAMIN D3) 5000 units TBDP Take 1 tablet by mouth daily.  . clobetasol ointment (TEMOVATE) 0.05 % as needed.   Marland Kitchen Co-Enzyme Q-10 30 MG CAPS Take 100 mg by mouth.  . famotidine (PEPCID) 20 MG tablet Take 1 tablet (20 mg total) by mouth 2 (two) times daily. As needed  . fish oil-omega-3 fatty acids 1000 MG capsule Take 2 capsules by mouth daily. 1 daily  . golimumab 2 mg/kg in sodium chloride 0.9 % Inject 2 mg/kg into the vein every 8 (eight) weeks.  .  hydroxychloroquine (PLAQUENIL) 200 MG tablet TAKE 2 TABLETS MONDAY THROUGH FRIDAY BUT NONE ON SATURDAY OR SUNDAY.  Marland Kitchen LIOTHYRONINE SODIUM PO Take 1 tablet by mouth daily. T4, T3 compounded medication  . Misc Natural Products (TART CHERRY ADVANCED PO) Take 1 tablet by mouth 2 (two) times daily.   . NON FORMULARY Progesterone compound 25mg  take 1 tablet daily  . Nutritional Supplements (DHEA) 15-50 MG CAPS Take 15 mg by mouth daily.  Marland Kitchen omeprazole (PRILOSEC) 40 MG capsule Take 1 capsule (40 mg total) by mouth daily.  . Probiotic Product (PROBIOTIC-10 PO) Take by mouth.  . vitamin E 400 UNIT capsule Take by mouth.  . [DISCONTINUED] chlorthalidone (HYGROTON) 25 MG tablet Take 25 mg by mouth as needed.   . irbesartan (AVAPRO) 75 MG tablet Take 75 mg by mouth daily.  Marland Kitchen loratadine (CLARITIN) 10 MG tablet Take 10 mg by mouth as needed.    No facility-administered encounter medications on file as of 02/09/2019.     Allergies as of 02/09/2019 - Review Complete 12/27/2018  Allergen Reaction Noted  . Latex Rash 06/07/2016  . Methotrexate Rash 08/17/2016  . Methotrexate derivatives Rash 08/17/2016  . Neomycin-bacitracin zn-polymyx Rash   . Tetracycline Swelling     Past Medical History:  Diagnosis Date  . Allergy, unspecified not elsewhere classified   . Anemia, iron deficiency   . Arthritis   . Asthma   . Basal cell carcinoma   . Diverticulosis of colon   . GERD (gastroesophageal reflux disease)   . Hx of adenomatous colonic polyps   .  Hypercholesterolemia   . Hypertension   . Hypothyroidism   . Obesity   . Osteoporosis   . Reflux esophagitis     Past Surgical History:  Procedure Laterality Date  . CATARACT EXTRACTION, BILATERAL    . CERVICAL CONE BIOPSY    . ENDOMETRIAL ABLATION    . TONSILLECTOMY      Family History  Problem Relation Age of Onset  . Colon cancer Cousin        Maternal Side  . Diabetes Maternal Grandmother   . Aneurysm Maternal Grandmother        brain  .  Heart disease Mother   . Bladder Cancer Mother   . Kidney cancer Mother   . Stroke Father   . Pneumonia Father   . Lung cancer Brother   . AAA (abdominal aortic aneurysm) Brother     Social History   Socioeconomic History  . Marital status: Married    Spouse name: Not on file  . Number of children: Not on file  . Years of education: Not on file  . Highest education level: Not on file  Occupational History  . Occupation: Retired  Scientific laboratory technician  . Financial resource strain: Not on file  . Food insecurity    Worry: Not on file    Inability: Not on file  . Transportation needs    Medical: Not on file    Non-medical: Not on file  Tobacco Use  . Smoking status: Never Smoker  . Smokeless tobacco: Never Used  Substance and Sexual Activity  . Alcohol use: No  . Drug use: No  . Sexual activity: Not on file  Lifestyle  . Physical activity    Days per week: Not on file    Minutes per session: Not on file  . Stress: Not on file  Relationships  . Social Herbalist on phone: Not on file    Gets together: Not on file    Attends religious service: Not on file    Active member of club or organization: Not on file    Attends meetings of clubs or organizations: Not on file    Relationship status: Not on file  . Intimate partner violence    Fear of current or ex partner: Not on file    Emotionally abused: Not on file    Physically abused: Not on file    Forced sexual activity: Not on file  Other Topics Concern  . Not on file  Social History Narrative  . Not on file      Review of systems: Review of Systems  Constitutional: Negative for fever and chills.  HENT: Post nasal drip  Eyes: Negative for blurred vision.  Respiratory: Negative for cough, shortness of breath and wheezing.   Cardiovascular: Negative for chest pain and palpitations.  Gastrointestinal: as per HPI Genitourinary: Negative for dysuria, urgency, frequency and hematuria.  Musculoskeletal:  Negative for myalgias, back pain and joint pain.  Skin: Negative for itching and rash.  Neurological: Negative for dizziness, tremors, focal weakness, seizures and loss of consciousness.  Endo/Heme/Allergies: Positive for seasonal allergies.  Psychiatric/Behavioral: Negative for depression, suicidal ideas and hallucinations.  All other systems reviewed and are negative.   Physical Exam: Vitals:   02/09/19 0815  BP: 120/80  Pulse: 79  Temp: 97.6 F (36.4 C)   Body mass index is 34.01 kg/m. Gen:      No acute distress HEENT:  EOMI, sclera anicteric Neck:     No  masses; no thyromegaly Lungs:    Clear to auscultation bilaterally; normal respiratory effort CV:         Regular rate and rhythm; no murmurs Abd:      + bowel sounds; soft, non-tender; no palpable masses, no distension Ext:    No edema; adequate peripheral perfusion Skin:      Warm and dry; no rash Neuro: alert and oriented x 3 Psych: normal mood and affect  Data Reviewed:  Reviewed labs, radiology imaging, old records and pertinent past GI work up   Assessment and Plan/Recommendations:  73 year old female with chronic GERD, severe erosive esophagitis and adenomatous colon polyps  GERD: Discussed antireflux measures and lifestyle modifications Continue omeprazole 40 mg daily  Severe erosive esophagitis, will schedule for repeat EGD to document healing of mucosa and obtain biopsies if needed. Intermittent dysphagia, could have peptic stricture, will plan for esophageal dilation if needed. The risks and benefits as well as alternatives of endoscopic procedure(s) have been discussed and reviewed. All questions answered. The patient agrees to proceed.  History of adenomatous colon polyps, due for surveillance colonoscopy March 2021    K. Denzil Magnuson , MD    CC: Bernerd Limbo, MD

## 2019-02-21 ENCOUNTER — Telehealth: Payer: Self-pay | Admitting: Gastroenterology

## 2019-02-21 NOTE — Telephone Encounter (Signed)
Ok to continue with Aspirin 81mg  daily, no contraindication from GI standpoint and she doesn't need to hold it prior to procedure. Thanks.

## 2019-02-21 NOTE — Telephone Encounter (Signed)
She will start an 81 mg EC ASA daily. Plans to take this with her largest meal daily. She is on 40 mg Prilosec daily. Follow up EGD on 03/05/19.

## 2019-02-21 NOTE — Telephone Encounter (Signed)
Patient advised.

## 2019-02-22 ENCOUNTER — Encounter: Payer: Self-pay | Admitting: Gastroenterology

## 2019-03-05 ENCOUNTER — Other Ambulatory Visit: Payer: Self-pay | Admitting: Gastroenterology

## 2019-03-05 ENCOUNTER — Ambulatory Visit (INDEPENDENT_AMBULATORY_CARE_PROVIDER_SITE_OTHER): Payer: Medicare Other

## 2019-03-05 DIAGNOSIS — Z1159 Encounter for screening for other viral diseases: Secondary | ICD-10-CM

## 2019-03-06 LAB — SARS CORONAVIRUS 2 (TAT 6-24 HRS): SARS Coronavirus 2: NEGATIVE

## 2019-03-07 ENCOUNTER — Ambulatory Visit (AMBULATORY_SURGERY_CENTER): Payer: Medicare Other | Admitting: Gastroenterology

## 2019-03-07 ENCOUNTER — Other Ambulatory Visit: Payer: Self-pay

## 2019-03-07 ENCOUNTER — Encounter: Payer: Self-pay | Admitting: Gastroenterology

## 2019-03-07 VITALS — BP 144/65 | HR 66 | Temp 97.7°F | Resp 22 | Ht 63.0 in | Wt 192.0 lb

## 2019-03-07 DIAGNOSIS — K219 Gastro-esophageal reflux disease without esophagitis: Secondary | ICD-10-CM | POA: Diagnosis not present

## 2019-03-07 DIAGNOSIS — R1310 Dysphagia, unspecified: Secondary | ICD-10-CM

## 2019-03-07 DIAGNOSIS — K449 Diaphragmatic hernia without obstruction or gangrene: Secondary | ICD-10-CM

## 2019-03-07 DIAGNOSIS — K2100 Gastro-esophageal reflux disease with esophagitis, without bleeding: Secondary | ICD-10-CM

## 2019-03-07 MED ORDER — SODIUM CHLORIDE 0.9 % IV SOLN: 500.0000 mL | Freq: Once | INTRAVENOUS | Status: DC

## 2019-03-07 NOTE — Progress Notes (Signed)
Temp JR  VS DT  Pt's states no medical or surgical changes since previsit or office visit. Admitting RN reviewed 

## 2019-03-07 NOTE — Progress Notes (Signed)
Report to PACU, RN, vss, BBS= Clear.  

## 2019-03-07 NOTE — Patient Instructions (Signed)
Please read handouts provided. Continue present medications. Follow anti-reflux regimen. Return to GI office in 1 year.       YOU HAD AN ENDOSCOPIC PROCEDURE TODAY AT [facility]:   Refer to the procedure report that was given to you for any specific questions about what was found during the examination.  If the procedure report does not answer your questions, please call your gastroenterologist to clarify.  If you requested that your care partner not be given the details of your procedure findings, then the procedure report has been included in a sealed envelope for you to review at your convenience later.  YOU SHOULD EXPECT: Some feelings of bloating in the abdomen. Passage of more gas than usual.  Walking can help get rid of the air that was put into your GI tract during the procedure and reduce the bloating. If you had a lower endoscopy (such as a colonoscopy or flexible sigmoidoscopy) you may notice spotting of blood in your stool or on the toilet paper. If you underwent a bowel prep for your procedure, you may not have a normal bowel movement for a few days.  Please Note:  You might notice some irritation and congestion in your nose or some drainage.  This is from the oxygen used during your procedure.  There is no need for concern and it should clear up in a day or so.  SYMPTOMS TO REPORT IMMEDIATELY:     Following upper endoscopy (EGD)  Vomiting of blood or coffee ground material  New chest pain or pain under the shoulder blades  Painful or persistently difficult swallowing  New shortness of breath  Fever of 100F or higher  Black, tarry-looking stools  For urgent or emergent issues, a gastroenterologist can be reached at any hour by calling [phone].   DIET:  We do recommend a small meal at first, but then you may proceed to your regular diet.  Drink plenty of fluids but you should avoid alcoholic beverages for 24 hours.  ACTIVITY:  You should plan to  take it easy for the rest of today and you should NOT DRIVE or use heavy machinery until tomorrow (because of the sedation medicines used during the test).    FOLLOW UP: Our staff will call the number listed on your records 48-72 hours following your procedure to check on you and address any questions or concerns that you may have regarding the information given to you following your procedure. If we do not reach you, we will leave a message.  We will attempt to reach you two times.  During this call, we will ask if you have developed any symptoms of COVID 19. If you develop any symptoms (ie: fever, flu-like symptoms, shortness of breath, cough etc.) before then, please call [phone].  If you test positive for Covid 19 in the 2 weeks post procedure, please call and report this information to [place].    If any biopsies were taken you will be contacted by phone or by letter within the next 1-3 weeks.  Please call us at [phone] if you have not heard about the biopsies in 3 weeks.    SIGNATURES/CONFIDENTIALITY: You and/or your care partner have signed paperwork which will be entered into your electronic medical record.  These signatures attest to the fact that that the information above on your After Visit Summary has been reviewed and is understood.  Full responsibility of the confidentiality of this discharge information lies with you and/or  your care-partner. 

## 2019-03-07 NOTE — Op Note (Signed)
[facility] Patient Name: Pebbles Nichol Procedure Date: 03/07/2019 10:32 AM MRN: [id number] Endoscopist: Mauri Pole , MD Age: 73 Referring MD:  Date of Birth: May 20, 1945 Gender: Female Account #: [id number] Procedure:                Upper GI endoscopy Indications:              Reflux esophagitis, Follow-up of reflux esophagitis Medicines:                Monitored Anesthesia Care Procedure:                Pre-Anesthesia Assessment:                           - Prior to the procedure, a History and Physical                            was performed, and patient medications and                            allergies were reviewed. The patient's tolerance of                            previous anesthesia was also reviewed. The risks                            and benefits of the procedure and the sedation                            options and risks were discussed with the patient.                            All questions were answered, and informed consent                            was obtained. Prior Anticoagulants: The patient has                            taken no previous anticoagulant or antiplatelet                            agents. ASA Grade Assessment: II - A patient with                            mild systemic disease. After reviewing the risks                            and benefits, the patient was deemed in                            satisfactory condition to undergo the procedure.                           After obtaining informed consent, the endoscope was  passed under direct vision. Throughout the                            procedure, the patient's blood pressure, pulse, and                            oxygen saturations were monitored continuously. The                            Endoscope was introduced through the mouth, and                            advanced to the second part of duodenum. The upper       GI endoscopy was accomplished without difficulty.                            The patient tolerated the procedure well. Scope In: Scope Out: Findings:                 The Z-line was regular and was found 36 cm from the                            incisors.                           The examined esophagus was normal. No esophagitis.                           A small hiatal hernia was present.                           The stomach was normal.                           The examined duodenum was normal. Complications:            No immediate complications. Estimated Blood Loss:     Estimated blood loss was minimal. Impression:               - Z-line regular, 36 cm from the incisors.                           - Normal esophagus.                           - Small hiatal hernia.                           - Normal stomach.                           - Normal examined duodenum.                           - No specimens collected. Recommendation:           - Patient has a contact number available for  emergencies. The signs and symptoms of potential                            delayed complications were discussed with the                            patient. Return to normal activities tomorrow.                            Written discharge instructions were provided to the                            patient.                           - Resume previous diet.                           - Continue present medications.                           - Follow an antireflux regimen indefinitely.                           - Return to my office in 1 year. Mauri Pole, MD 03/07/2019 10:44:01 AM This report has been signed electronically.

## 2019-03-09 ENCOUNTER — Telehealth: Payer: Self-pay

## 2019-03-09 NOTE — Telephone Encounter (Signed)
  Follow up Call-  Call back number 03/07/2019 03/07/2019 12/27/2018  Post procedure Call Back phone  # [phone]  Permission to leave phone message Yes - Yes  Some recent data might be hidden     Patient questions:  Do you have a fever, pain , or abdominal swelling? No. Pain Score  0 *  Have you tolerated food without any problems? Yes.    Have you been able to return to your normal activities? Yes.    Do you have any questions about your discharge instructions: Diet   No. Medications  No. Follow up visit  No.  Do you have questions or concerns about your Care? No.  Actions: * If pain score is 4 or above: No action needed, pain <4.  1. Have you developed a fever since your procedure? no  2.   Have you had an respiratory symptoms (SOB or cough) since your procedure? no  3.   Have you tested positive for COVID 19 since your procedure no  4.   Have you had any family members/close contacts diagnosed with the COVID 19 since your procedure?  no   If yes to any of these questions please route to Joylene John, RN and Alphonsa Gin, Therapist, sports.

## 2019-05-06 ENCOUNTER — Ambulatory Visit: Payer: Medicare PPO | Attending: Internal Medicine

## 2019-05-06 DIAGNOSIS — Z23 Encounter for immunization: Secondary | ICD-10-CM

## 2019-05-06 NOTE — Progress Notes (Signed)
   Covid-19 Vaccination Clinic  Name:  Sally Diaz    MRN: [id number] DOB: 02-Jun-1945  05/06/2019  Sally Diaz was observed post Covid-19 immunization for 15 minutes without incidence. She was provided with Vaccine Information Sheet and instruction to access the V-Safe system.   Sally Diaz was instructed to call 911 with any severe reactions post vaccine: Marland Kitchen Difficulty breathing  . Swelling of your face and throat  . A fast heartbeat  . A bad rash all over your body  . Dizziness and weakness    Immunizations Administered    Name Date Dose VIS Date Route   Pfizer COVID-19 Vaccine 05/06/2019  9:10 AM 0.3 mL 03/02/2019 Intramuscular   Manufacturer: [place]   Lot: [id number]   [place]: [id number]

## 2019-05-28 ENCOUNTER — Ambulatory Visit: Payer: Medicare PPO | Attending: Internal Medicine

## 2019-05-28 DIAGNOSIS — Z23 Encounter for immunization: Secondary | ICD-10-CM | POA: Insufficient documentation

## 2019-05-28 NOTE — Progress Notes (Signed)
   Covid-19 Vaccination Clinic  Name:  Sally Diaz    MRN: [id number] DOB: [birth year]  05/28/2019  [name] was observed post Covid-19 immunization for 15 minutes without incident. She was provided with Vaccine Information Sheet and instruction to access the V-Safe system.   [name] was instructed to call 911 with any severe reactions post vaccine: Marland Kitchen Difficulty breathing  . Swelling of face and throat  . A fast heartbeat  . A bad rash all over body  . Dizziness and weakness   Immunizations Administered    Name Date Dose VIS Date Route   Pfizer COVID-19 Vaccine 05/28/2019  6:10 PM 0.3 mL 03/02/2019 Intramuscular   Manufacturer: [place]   Lot: [id number]   [place]: [id number]

## 2019-12-18 ENCOUNTER — Other Ambulatory Visit: Payer: Self-pay | Admitting: Gastroenterology

## 2020-03-25 ENCOUNTER — Encounter: Payer: Self-pay | Admitting: Gastroenterology

## 2020-03-25 ENCOUNTER — Ambulatory Visit (INDEPENDENT_AMBULATORY_CARE_PROVIDER_SITE_OTHER): Payer: Medicare PPO | Admitting: Gastroenterology

## 2020-03-25 VITALS — BP 160/80 | HR 80 | Ht 61.5 in | Wt 192.0 lb

## 2020-03-25 DIAGNOSIS — K2100 Gastro-esophageal reflux disease with esophagitis, without bleeding: Secondary | ICD-10-CM

## 2020-03-25 DIAGNOSIS — R1013 Epigastric pain: Secondary | ICD-10-CM

## 2020-03-25 DIAGNOSIS — Z86010 Personal history of colonic polyps: Secondary | ICD-10-CM

## 2020-03-25 MED ORDER — OMEPRAZOLE 40 MG PO CPDR: DELAYED_RELEASE_CAPSULE | ORAL | 3 refills | Status: DC

## 2020-03-25 MED ORDER — SUCRALFATE 1 G PO TABS: 1.0000 g | ORAL_TABLET | Freq: Two times a day (BID) | ORAL | 3 refills | Status: AC

## 2020-03-25 MED ORDER — SUPREP BOWEL PREP KIT 17.5-3.13-1.6 GM/177ML PO SOLN: 1.0000 | Freq: Once | ORAL | 0 refills | Status: AC

## 2020-03-25 NOTE — Progress Notes (Signed)
Sally Diaz    [id number]    1945-09-21  Primary Care Physician:Bouska, Onalee Hua, MD  Referring Physician: Tracey Harries, MD [address] [place],  [place] [address]   Chief complaint:  GERD HPI:  17 yr very pleasant female here for follow up visit for GERD.  She was having more frequent GERD symptoms last month but have improved since. She is taking Omeprazole daily and is using TUMS as needed for breakthrough symptoms Denies dysphagia or vomiting.  She was doing better when she lost weight, about 10bs but she has gained back some during the holidays. Overall her symptoms are stable  EGD 03/07/2019: Small hiatal hernia otherwise normal  EGD December 27, 2018: LA grade C esophagitis and GE junction nodularity, biopsies showed ulcerative esophagitis, negative for CMV, HSV and yeast infection. Hiatal hernia and mild gastritis, biopsy showed mild chronic gastritis, negative for H. Pylori  EGD 3/16/2007by Dr. Juanda Chance: Reflux esophagitis, biopsies negative for intestinal metaplasia  Colonoscopy 06/07/2016 Left-sided colon diverticulosis, 11 mm polyp ascending colon [tubular adenoma] and 4 mm hyperplastic polyptransverse colon removed   Outpatient Encounter Medications as of 03/25/2020  Medication Sig  . [place] Oil-Levomenthol (FDGARD PO) Take 1 tablet by mouth daily.  . Cholecalciferol (VITAMIN D3) 5000 units TBDP Take 1 tablet by mouth daily.  Marland Kitchen Co-Enzyme Q-10 30 MG CAPS Take 100 mg by mouth.  . fish oil-omega-3 fatty acids 1000 MG capsule Take 2 capsules by mouth daily. 1 daily  . Golimumab (SIMPONI ARIA IV) Inject into the vein. Every 8 weeks  . hydroxychloroquine (PLAQUENIL) 200 MG tablet TAKE 2 TABLETS MONDAY THROUGH FRIDAY BUT NONE ON SATURDAY OR SUNDAY.  Marland Kitchen irbesartan (AVAPRO) 75 MG tablet Take 75 mg by mouth daily.  Marland Kitchen LIOTHYRONINE SODIUM PO Take 1 tablet by mouth daily. T4, T3 compounded medication  . loratadine (CLARITIN) 10 MG tablet  Take 10 mg by mouth as needed.   . Misc Natural Products (TART CHERRY ADVANCED PO) Take 1 tablet by mouth 2 (two) times daily.   . Multiple Vitamins-Minerals (HAIR/SKIN/NAILS/BIOTIN) TABS Take 1 tablet by mouth daily.  . Nutritional Supplements (DHEA) 15-50 MG CAPS Take 15 mg by mouth daily.  Marland Kitchen omeprazole (PRILOSEC) 40 MG capsule TAKE (1) CAPSULE DAILY.  Marland Kitchen Zinc 30 MG CAPS Take 1 capsule by mouth daily.  Marland Kitchen albuterol (PROVENTIL HFA;VENTOLIN HFA) 108 (90 BASE) MCG/ACT inhaler Use 2 puffs every 4-6 hours as needed (Patient not taking: No sig reported)  . clobetasol ointment (TEMOVATE) 0.05 % as needed.   . Probiotic Product (PROBIOTIC-10 PO) Take by mouth. (Patient not taking: Reported on 03/25/2020)  . vitamin E 400 UNIT capsule Take by mouth. (Patient not taking: Reported on 03/25/2020)  . [DISCONTINUED] famotidine (PEPCID) 20 MG tablet Take 1 tablet (20 mg total) by mouth 2 (two) times daily. As needed (Patient not taking: Reported on 03/07/2019)  . [DISCONTINUED] golimumab 2 mg/kg in sodium chloride 0.9 % Inject 2 mg/kg into the vein every 8 (eight) weeks.  . [DISCONTINUED] NON FORMULARY Progesterone compound 25mg  take 1 tablet daily   No facility-administered encounter medications on file as of 03/25/2020.    Allergies as of 03/25/2020 - Review Complete 03/07/2019  Allergen Reaction Noted  . Latex Rash 06/07/2016  . Methotrexate Rash 08/17/2016  . Methotrexate derivatives Rash 08/17/2016  . Neomycin-bacitracin zn-polymyx Rash   . Tetracycline Swelling     Past Medical History:  Diagnosis Date  . Allergy, unspecified  not elsewhere classified   . Anemia, iron deficiency   . Arthritis   . Asthma   . Basal cell carcinoma   . Diverticulosis of colon   . GERD (gastroesophageal reflux disease)   . Hx of adenomatous colonic polyps   . Hypercholesterolemia   . Hypertension   . Hypothyroidism   . Obesity   . Osteoporosis   . Reflux esophagitis     Past Surgical History:  Procedure  Laterality Date  . CATARACT EXTRACTION, BILATERAL    . CERVICAL CONE BIOPSY    . COLONOSCOPY    . ENDOMETRIAL ABLATION    . TONSILLECTOMY    . UPPER GASTROINTESTINAL ENDOSCOPY      Family History  Problem Relation Age of Onset  . Colon cancer Cousin        Maternal Side  . Diabetes Maternal Grandmother   . Aneurysm Maternal Grandmother        brain  . Heart disease Mother   . Bladder Cancer Mother   . Kidney cancer Mother   . Stroke Father   . Pneumonia Father   . Lung cancer Brother   . AAA (abdominal aortic aneurysm) Brother   . Esophageal cancer Neg Hx   . Stomach cancer Neg Hx   . Rectal cancer Neg Hx     Social History   Socioeconomic History  . Marital status: Married    Spouse name: Not on file  . Number of children: Not on file  . Years of education: Not on file  . Highest education level: Not on file  Occupational History  . Occupation: Retired  Tobacco Use  . Smoking status: Never Smoker  . Smokeless tobacco: Never Used  Vaping Use  . Vaping Use: Never used  Substance and Sexual Activity  . Alcohol use: No  . Drug use: No  . Sexual activity: Not on file  Other Topics Concern  . Not on file  Social History Narrative  . Not on file   Social Determinants of Health   Financial Resource Strain: Not on file  Food Insecurity: Not on file  Transportation Needs: Not on file  Physical Activity: Not on file  Stress: Not on file  Social Connections: Not on file  Intimate Partner Violence: Not on file      Review of systems: All other review of systems negative except as mentioned in the HPI.   Physical Exam: Vitals:   03/25/20 1038  BP: (!) 160/80  Pulse: 80   Body mass index is 35.69 kg/m. Gen:      No acute distress HEENT:  sclera anicteric Neuro: alert and oriented x 3 Psych: normal mood and affect  Data Reviewed:  Reviewed labs, radiology imaging, old records and pertinent past GI work up   Assessment and  Plan/Recommendations:  56 yr very pleasant female here for follow up of GERD  GERD: History of erosive esophagitis.  Currently stable symptoms on daily PPI with occasional breakthrough Continue omeprazole 40 mg daily Use Carafate 1 g twice daily as needed  Dyspepsia: Use FD guard as needed  History of adenomatous colon polyps greater than 1 cm in size, due for surveillance colonoscopy . Will schedule it The risks and benefits as well as alternatives of endoscopic procedure(s) have been discussed and reviewed. All questions answered. The patient agrees to proceed.  Return in 6 months or sooner if needed  This visit required 40 minutes of patient care (this includes precharting, chart review, review of results,  face-to-face time used for counseling as well as treatment plan and follow-up. The patient was provided an opportunity to ask questions and all were answered. The patient agreed with the plan and demonstrated an understanding of the instructions.  Damaris Hippo , MD    CC: Bernerd Limbo, MD

## 2020-03-25 NOTE — Patient Instructions (Addendum)
You have been scheduled for a colonoscopy. Please follow written instructions given to you at your visit today.  Please pick up your prep supplies at the pharmacy within the next 1-3 days. If you use inhalers (even only as needed), please bring them with you on the day of your procedure.  Due to recent changes in healthcare laws, you may see the results of your imaging and laboratory studies on MyChart before your provider has had a chance to review them.  We understand that in some cases there may be results that are confusing or concerning to you. Not all laboratory results come back in the same time frame and the provider may be waiting for multiple results in order to interpret others.  Please give [place] 48 hours in order for your provider to thoroughly review all the results before contacting the office for clarification of your results.    Continue Omeprazole 40 mg daily  Take carafate 1 gm twice daily as needed   Follow up in 6 months  I appreciate the  opportunity to care for you  Thank You   Marsa Aris , MD

## 2020-05-19 ENCOUNTER — Telehealth: Payer: Self-pay | Admitting: Gastroenterology

## 2020-05-19 NOTE — Telephone Encounter (Signed)
Pt had salad and nuts over the weekend. Called the patient back and told her we could proceed but to follow clear liquid instructions and push the fluids today.

## 2020-05-19 NOTE — Telephone Encounter (Signed)
Patient called and said she had peppers, salad and nuts on the weekend and would like to know if she can still come in for her procedure for tomorrow

## 2020-05-20 ENCOUNTER — Encounter: Payer: Self-pay | Admitting: Gastroenterology

## 2020-05-20 ENCOUNTER — Ambulatory Visit (AMBULATORY_SURGERY_CENTER): Payer: Medicare PPO | Admitting: Gastroenterology

## 2020-05-20 ENCOUNTER — Other Ambulatory Visit: Payer: Self-pay

## 2020-05-20 VITALS — BP 159/81 | HR 80 | Temp 97.1°F | Resp 16 | Ht 61.0 in | Wt 192.0 lb

## 2020-05-20 DIAGNOSIS — Z86010 Personal history of colonic polyps: Secondary | ICD-10-CM | POA: Diagnosis not present

## 2020-05-20 MED ORDER — SODIUM CHLORIDE 0.9 % IV SOLN: 500.0000 mL | Freq: Once | INTRAVENOUS | Status: AC

## 2020-05-20 NOTE — Progress Notes (Signed)
A and O x3. Report to RN. Tolerated MAC anesthesia well.

## 2020-05-20 NOTE — Progress Notes (Signed)
VS taken by S.B. 

## 2020-05-20 NOTE — Patient Instructions (Signed)
Handout given:  Hemorrhoidal banding, diverticulosis Resume previous diet Continue current medications  YOU HAD AN ENDOSCOPIC PROCEDURE TODAY AT [facility]:   Refer to the procedure report that was given to you for any specific questions about what was found during the examination.  If the procedure report does not answer your questions, please call your gastroenterologist to clarify.  If you requested that your care partner not be given the details of your procedure findings, then the procedure report has been included in a sealed envelope for you to review at your convenience later.  YOU SHOULD EXPECT: Some feelings of bloating in the abdomen. Passage of more gas than usual.  Walking can help get rid of the air that was put into your GI tract during the procedure and reduce the bloating. If you had a lower endoscopy (such as a colonoscopy or flexible sigmoidoscopy) you may notice spotting of blood in your stool or on the toilet paper. If you underwent a bowel prep for your procedure, you may not have a normal bowel movement for a few days.  Please Note:  You might notice some irritation and congestion in your nose or some drainage.  This is from the oxygen used during your procedure.  There is no need for concern and it should clear up in a day or so.  SYMPTOMS TO REPORT IMMEDIATELY:   Following lower endoscopy (colonoscopy or flexible sigmoidoscopy):  Excessive amounts of blood in the stool  Significant tenderness or worsening of abdominal pains  Swelling of the abdomen that is new, acute  Fever of 100F or higher  For urgent or emergent issues, a gastroenterologist can be reached at any hour by calling [phone]. Do not use MyChart messaging for urgent concerns.   DIET:  We do recommend a small meal at first, but then you may proceed to your regular diet.  Drink plenty of fluids but you should avoid alcoholic beverages for 24 hours.  ACTIVITY:  You should plan to  take it easy for the rest of today and you should NOT DRIVE or use heavy machinery until tomorrow (because of the sedation medicines used during the test).    FOLLOW UP: Our staff will call the number listed on your records 48-72 hours following your procedure to check on you and address any questions or concerns that you may have regarding the information given to you following your procedure. If we do not reach you, we will leave a message.  We will attempt to reach you two times.  During this call, we will ask if you have developed any symptoms of COVID 19. If you develop any symptoms (ie: fever, flu-like symptoms, shortness of breath, cough etc.) before then, please call [phone].  If you test positive for Covid 19 in the 2 weeks post procedure, please call and report this information to [place].    If any biopsies were taken you will be contacted by phone or by letter within the next 1-3 weeks.  Please call us at [phone] if you have not heard about the biopsies in 3 weeks.   SIGNATURES/CONFIDENTIALITY: You and/or your care partner have signed paperwork which will be entered into your electronic medical record.  These signatures attest to the fact that that the information above on your After Visit Summary has been reviewed and is understood.  Full responsibility of the confidentiality of this discharge information lies with you and/or your care-partner.

## 2020-05-20 NOTE — Op Note (Addendum)
[facility] Patient Name: Sally Diaz Procedure Date: 05/20/2020 9:16 AM MRN: [id number] Endoscopist: Mauri Pole , MD Age: 75 Referring MD:  Date of Birth: [birth year] Gender: Female Account #: [id number] Procedure:                Colonoscopy Indications:              High risk colon cancer surveillance: Personal                            history of colonic polyps, High risk colon cancer                            surveillance: Personal history of adenoma (10 mm or                            greater in size) Medicines:                Monitored Anesthesia Care Procedure:                Pre-Anesthesia Assessment:                           - Prior to the procedure, a History and Physical                            was performed, and patient medications and                            allergies were reviewed. The patient's tolerance of                            previous anesthesia was also reviewed. The risks                            and benefits of the procedure and the sedation                            options and risks were discussed with the patient.                            All questions were answered, and informed consent                            was obtained. Prior Anticoagulants: The patient has                            taken no previous anticoagulant or antiplatelet                            agents. ASA Grade Assessment: II - A patient with                            mild systemic disease. After reviewing the risks  and benefits, the patient was deemed in                            satisfactory condition to undergo the procedure.                           After obtaining informed consent, the colonoscope                            was passed under direct vision. Throughout the                            procedure, the patient's blood pressure, pulse, and                            oxygen saturations were monitored  continuously. The                            Olympus [id number]-[id number]) Colonoscope was                            introduced through the anus and advanced to the the                            cecum, identified by appendiceal orifice and                            ileocecal valve. The colonoscopy was performed                            without difficulty. The patient tolerated the                            procedure well. The quality of the bowel                            preparation was good. The ileocecal valve,                            appendiceal orifice, and rectum were photographed. Scope In: 9:17:46 AM Scope Out: 9:32:20 AM Scope Withdrawal Time: 0 hours 7 minutes 48 seconds  Total Procedure Duration: 0 hours 14 minutes 34 seconds  Findings:                 The perianal and digital rectal examinations were                            normal.                           Scattered small and large-mouthed diverticula were                            found in the sigmoid colon, descending colon and  ascending colon. There was evidence of an impacted                            diverticulum.                           Non-bleeding external and internal hemorrhoids were                            found during retroflexion. The hemorrhoids were                            medium-sized. Complications:            No immediate complications. Estimated Blood Loss:     Estimated blood loss was minimal. Impression:               - Moderate diverticulosis in the sigmoid colon, in                            the descending colon and in the ascending colon.                            There was evidence of an impacted diverticulum.                           - Non-bleeding external and internal hemorrhoids.                           - No specimens collected. Recommendation:           - Patient has a contact number available for                            emergencies. The  signs and symptoms of potential                            delayed complications were discussed with the                            patient. Return to normal activities tomorrow.                            Written discharge instructions were provided to the                            patient.                           - Resume previous diet.                           - Continue present medications.                           - No repeat colonoscopy due to age. Mauri Pole, MD 05/20/2020 9:36:19 AM This report has been signed electronically.

## 2020-05-22 ENCOUNTER — Telehealth: Payer: Self-pay

## 2020-05-22 NOTE — Telephone Encounter (Signed)
Attempted to reach patient for post-procedure f/u call. No answer. Left message that we will make another attempt to reach her later today and for her to please not hesitate to call us if she has any questions/concerns regarding her care. 

## 2020-12-04 ENCOUNTER — Telehealth: Payer: Self-pay | Admitting: Gastroenterology

## 2020-12-04 NOTE — Telephone Encounter (Signed)
Patient called states she is having intestinal issues and is seeking advise from a nurse.

## 2020-12-04 NOTE — Telephone Encounter (Signed)
Spoke with patient, she states that she has been having lower abdominal cramping for about 3 days now. Pt states that she thinks that she is constipated because she has only been passing stools the size of jelly beans. She states that she passed a little larger stool today about the size of 2 fingers. Pt states that she has already taken docusate sodium today. Advised to take as directed on box today but if no relief by tomorrow she can complete a Miralax bowel purge, I have instructed her on how to complete this. Advised patient that she needs to make sure she is drinking plenty of water and try to move around her home. Pt will call back on Monday if she does not have any relief. Pt verbalized understanding and had no concerns at the end of the call.

## 2021-01-02 ENCOUNTER — Ambulatory Visit: Payer: Medicare PPO | Admitting: Neurology

## 2021-01-21 ENCOUNTER — Encounter: Payer: Self-pay | Admitting: Neurology

## 2021-02-01 NOTE — Progress Notes (Signed)
Assessment/Plan:  1.  Tremor  -Have a strong suspicion for ET/PD.  Patient certainly has parkinsonian features with bradykinesia (especially facial) and right hand tremor.  However, she has both rest tremor and postural tremor.  She describes something similar in her father as well, who also later was diagnosed with Parkinson's disease (although he died when he was 76 years old).  -Would like to do a DaTscan.  -Would like to do some lab work, but she states that she is scheduled to have lab work at primary care next month and we will try to get a copy of that.  2.  Significant hyperreflexia  -Patient denies any neck pain, so we will hold on doing any MRIs of the cervical spine  -She will have an MRI of the brain.  We reviewed her MRI of the brain from 2019.  She was unaware of the chronic infarcts seen on that scan, so we reviewed that.  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    Subjective:   Sally Diaz was seen today in the movement disorders clinic for neurologic consultation at the request of Bernerd Limbo, MD.  The consultation is for the evaluation of hand tremor.  Outside records that were made available to me were reviewed.  This patient is accompanied in the office by her father who supplements the history.  Tremor: Yes.     How long has it been going on? 1 year per pt but many per husband  At rest or with activation?  Picking up something  When is it noted the most?  Nervous/in hurry/multitasking  Fam hx of tremor?  Yes.  Father with Parkinsons Disease   Located where?  R hand first but now in the left sometimes and some in the face/neck  Affected by caffeine:  Yes.   (But drinks mostly decaf now)  Affected by alcohol:  doesn't drink  Affected by stress:  Yes.    Affected by fatigue:  No.  Spills soup if on spoon:  No.  Spills glass of liquid if full:  No.  Affects ADL's (tying shoes, brushing teeth, etc):  No.  Tremor inducing meds:  No.  Other Specific Symptoms:   Voice: no change Sleep: trouble staying asleep  Vivid Dreams:  No.  Acting out dreams:  No. Wet Pillows: No. Postural symptoms:  Yes.  , mostly when tries to turn  Falls?  Yes.  , last fall was about 1 week ago - tripped over pipe in yard b/c leaves were disguising pipe; prior fall 1-2 months [place] and she tripped over root of tree. Bradykinesia symptoms: shuffling gait, slow movements, and difficulty getting out of a chair Loss of smell:  No. Loss of taste:  some decrease Urinary Incontinence:  occ wears pad Difficulty Swallowing:  No. Handwriting, micrographia: just gotten less fluid Depression:  No. Hallucinations:  No.  visual distortions: No. N/V:  No. Lightheaded:  No.  Syncope: No. Diplopia:  No. Dyskinesia:  No.   MRI brain last performed in 11/2017 and done for chronic loss of smell/taste.  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  Personally reviewed the films.  She states she did have CT brain [place] after [place].    ALLERGIES:   Allergies  Allergen Reactions   Latex Rash   Methotrexate Rash   Methotrexate Derivatives Rash   Neomycin-Bacitracin Zn-Polymyx Rash  Tetracycline Swelling    CURRENT MEDICATIONS:  Current Outpatient Medications  Medication Instructions   albuterol (PROVENTIL HFA;VENTOLIN HFA) 108 (90 BASE) MCG/ACT inhaler Use 2 puffs every 4-6 hours as needed   [place]araway Oil-Levomenthol (FDGARD PO) 1 tablet, Oral, Daily   Cholecalciferol (VITAMIN D3) 5000 units TBDP 1 tablet, Daily   clobetasol ointment (TEMOVATE) 0.05 % As needed   Co-Enzyme Q-10 100 mg, Oral   fish oil-omega-3 fatty acids 1000 MG capsule 2 capsules, Daily   Golimumab ([place] ARIA IV) Intravenous, Every 8 weeks   hydroxychloroquine (PLAQUENIL) 200 MG tablet TAKE 2 TABLETS MONDAY THROUGH FRIDAY BUT NONE ON SATURDAY OR SUNDAY.   irbesartan (AVAPRO) 75 mg, Oral, Daily    LIOTHYRONINE SODIUM PO 1 tablet, Daily   loratadine (CLARITIN) 10 mg, Oral, As needed   Misc Natural Products (TART CHERRY ADVANCED PO) 1 tablet, 2 times daily   Multiple Vitamins-Minerals (HAIR/SKIN/NAILS/BIOTIN) TABS 1 tablet, Oral, Daily   Nutritional Supplements (DHEA) 15-50 MG CAPS 15 mg, Oral, Daily   omeprazole (PRILOSEC) 40 MG capsule TAKE (1) CAPSULE DAILY.   Probiotic Product (PROBIOTIC-10 PO) Take by mouth.   sucralfate (CARAFATE) 1 g, Oral, 2 times daily   vitamin E 400 UNIT capsule Take by mouth.   Zinc 30 MG CAPS 1 capsule, Oral, Daily    Objective:   PHYSICAL EXAMINATION:    VITALS:   Vitals:   02/04/21 0834  BP: 135/62  Pulse: 76  SpO2: 99%  Weight: 193 lb 9.6 oz (87.8 kg)  Height: 5\' 2"  (1.575 m)    GEN:  The patient appears stated age and is in NAD. HEENT:  Normocephalic, atraumatic.  The mucous membranes are moist. The superficial temporal arteries are without ropiness or tenderness. CV:  RRR Lungs:  CTAB Neck/HEME:  There are no carotid bruits bilaterally.  Neurological examination:  Orientation: The patient is alert and oriented x3.  Cranial nerves: There is good facial symmetry.  There is facial hypomimia, with lips parted.  Extraocular muscles are intact. The visual fields are full to confrontational testing. The speech is fluent and clear. Soft palate rises symmetrically and there is no tongue deviation. Hearing is intact to conversational tone. Sensation: Sensation is intact to light touch throughout (facial, trunk, extremities). Vibration is intact at the bilateral big toe.  It is slightly decreased distally.  There is no extinction with double simultaneous stimulation.  Motor: Strength is 5/5 in the bilateral upper and lower extremities.   Shoulder shrug is equal and symmetric.  There is no pronator drift. Deep tendon reflexes: Deep tendon reflexes are 3+/4 at the bilateral biceps, triceps, brachioradialis, patella.  There are pectoralis reflexes  bilaterally.  Plantar responses are downgoing bilaterally.  Movement examination: Tone: There is normal tone in the bilateral upper extremities.  The tone in the lower extremities is normal.  Abnormal movements: There is right hand rest tremor that increases with distraction.  There is intention tremor bilaterally.  She is able to draw Archimedes spirals fairly well bilaterally, although she does have trouble getting the pen on the paper bilaterally.  She is able to pour water from 1 glass to another. Coordination:  There is decremation with RAM's, only with finger taps on the right.  Although the rapid alternating movements are normal. Gait and Station: The patient has no difficulty arising out of a deep-seated chair without the use of the hands. The patient's stride length is good.   I have reviewed and interpreted the following labs independently  Chemistry      Component Value Date/Time   NA 138 04/22/2017 1400   K 4.1 04/22/2017 1400   CL 101 04/22/2017 1400   CO2 30 04/22/2017 1400   BUN 16 04/22/2017 1400   CREATININE 1.05 (H) 04/22/2017 1400      Component Value Date/Time   CALCIUM 10.0 04/22/2017 1400   ALKPHOS 84 10/04/2016 1704   AST 19 04/22/2017 1400   ALT 18 04/22/2017 1400   BILITOT 0.4 04/22/2017 1400      No results found for: TSH Lab Results  Component Value Date   WBC 4.9 04/22/2017   HGB 11.5 (L) 04/22/2017   HCT 34.6 (L) 04/22/2017   MCV 87.4 04/22/2017   PLT 274 04/22/2017      Total time spent on today's visit was 53minutes, including both face-to-face time and nonface-to-face time.  Time included that spent on review of records (prior notes available to me/labs/imaging if pertinent), discussing treatment and goals, answering patient's questions and coordinating care.  Cc:  Bernerd Limbo, MD

## 2021-02-04 ENCOUNTER — Other Ambulatory Visit: Payer: Self-pay

## 2021-02-04 ENCOUNTER — Encounter: Payer: Self-pay | Admitting: Neurology

## 2021-02-04 ENCOUNTER — Ambulatory Visit: Payer: Medicare PPO | Admitting: Neurology

## 2021-02-04 VITALS — BP 135/62 | HR 76 | Ht 62.0 in | Wt 193.6 lb

## 2021-02-04 DIAGNOSIS — R251 Tremor, unspecified: Secondary | ICD-10-CM | POA: Diagnosis not present

## 2021-02-04 DIAGNOSIS — R292 Abnormal reflex: Secondary | ICD-10-CM

## 2021-02-04 DIAGNOSIS — G20 Parkinson's disease: Secondary | ICD-10-CM | POA: Diagnosis not present

## 2021-02-04 DIAGNOSIS — R9389 Abnormal findings on diagnostic imaging of other specified body structures: Secondary | ICD-10-CM | POA: Diagnosis not present

## 2021-02-04 DIAGNOSIS — G20C Parkinsonism, unspecified: Secondary | ICD-10-CM

## 2021-02-04 NOTE — Patient Instructions (Signed)
A referral to [facility] has been placed for your MRI someone will contact you directly to schedule your appt. They are located at [address] Please contact them directly by calling [phone] with any questions regarding your referral.  

## 2021-02-07 ENCOUNTER — Ambulatory Visit
Admission: RE | Admit: 2021-02-07 | Discharge: 2021-02-07 | Disposition: A | Discharge status: Home / Self Care | Payer: Medicare PPO | Source: Ambulatory Visit | Attending: Neurology | Admitting: Neurology

## 2021-02-07 ENCOUNTER — Other Ambulatory Visit: Payer: Self-pay

## 2021-02-07 DIAGNOSIS — R292 Abnormal reflex: Secondary | ICD-10-CM

## 2021-02-07 DIAGNOSIS — R9389 Abnormal findings on diagnostic imaging of other specified body structures: Secondary | ICD-10-CM

## 2021-02-19 ENCOUNTER — Encounter (HOSPITAL_COMMUNITY): Payer: Medicare PPO

## 2021-02-19 ENCOUNTER — Encounter (HOSPITAL_COMMUNITY): Admission: RE | Admit: 2021-02-19 | Payer: Medicare PPO | Source: Ambulatory Visit

## 2021-02-27 ENCOUNTER — Other Ambulatory Visit: Payer: Self-pay

## 2021-02-27 ENCOUNTER — Ambulatory Visit (HOSPITAL_COMMUNITY)
Admission: RE | Admit: 2021-02-27 | Discharge: 2021-02-27 | Disposition: A | Discharge status: Home / Self Care | Payer: Medicare PPO | Source: Ambulatory Visit | Attending: Neurology | Admitting: Neurology

## 2021-02-27 ENCOUNTER — Encounter (HOSPITAL_COMMUNITY)
Admission: RE | Admit: 2021-02-27 | Discharge: 2021-02-27 | Disposition: A | Discharge status: Home / Self Care | Payer: Medicare PPO | Source: Ambulatory Visit | Attending: Neurology | Admitting: Neurology

## 2021-02-27 DIAGNOSIS — R251 Tremor, unspecified: Secondary | ICD-10-CM | POA: Insufficient documentation

## 2021-02-27 MED ORDER — POTASSIUM IODIDE (ANTIDOTE) 130 MG PO TABS
ORAL_TABLET | ORAL | Status: AC
  Filled 2021-02-27: qty 1

## 2021-02-27 MED ORDER — POTASSIUM IODIDE (ANTIDOTE) 130 MG PO TABS: 130.0000 mg | ORAL_TABLET | Freq: Once | ORAL | Status: DC

## 2021-02-27 MED ORDER — IOFLUPANE I 123 185 MBQ/2.5ML IV SOLN
5.0500 | Freq: Once | INTRAVENOUS | Status: AC | PRN
  Administered 2021-02-27: 5.05 via INTRAVENOUS

## 2021-03-22 DIAGNOSIS — G20A1 Parkinson's disease without dyskinesia, without mention of fluctuations: Secondary | ICD-10-CM

## 2021-03-22 HISTORY — DX: Parkinson's disease without dyskinesia, without mention of fluctuations: G20.A1

## 2021-03-24 ENCOUNTER — Other Ambulatory Visit: Payer: Self-pay | Admitting: Gastroenterology

## 2021-04-01 NOTE — Progress Notes (Signed)
Assessment/Plan:   1.  ET/PD  -DaTscan correlates with symptoms.  DaTscan demonstrated decreased activity in the left putamen.  -Patient's father diagnosed with Parkinson's disease in his later life as well.  We can discuss potential genetic testing in the future.  -Discussed addition of carbidopa/levodopa to her current medication regimen.  Discussed risk, benefits, side effects.  -Discussed the importance of safe, cardiovascular exercise.  -she asks about multiple supplements, like homocysteine and glutathione.  Told her that these are not recommended in Parkinson's disease.  I did recommend the Mediterranean diet, as well as exercise, as above.  -Sent a referral to neuro rehab for physical therapy.  -Patient/husband asked several questions and answered those to the best of my ability.  -Patient met with my LCSW today.   Subjective:   Sally Diaz was seen today in follow up for testing results. This patient is accompanied in the office by her spouse who supplements the history. I was concerned last visit that patient had ET/PD.  She had a DaTscan done since our last visit.  This was done on December 9.  There is decreased activity in the left putamen.  I personally reviewed that.  MRI brain completed February 07, 2021 with evidence of small vessel disease.    ALLERGIES:   Allergies  Allergen Reactions   Latex Rash   Methotrexate Rash   Methotrexate Derivatives Rash   Neomycin-Bacitracin Zn-Polymyx Rash   Tetracycline Swelling    CURRENT MEDICATIONS:  Current Meds  Medication Sig   Caraway Oil-Levomenthol (FDGARD PO) Take 1 tablet by mouth daily.   Cholecalciferol (VITAMIN D3) 5000 units TBDP Take 1 tablet by mouth daily.   clobetasol ointment (TEMOVATE) 0.05 % as needed.    Co-Enzyme Q-10 30 MG CAPS Take 100 mg by mouth.   fish oil-omega-3 fatty acids 1000 MG capsule Take 2 capsules by mouth daily. 1 daily   Golimumab ([place] ARIA IV) Inject into the vein. Every 8  weeks   hydroxychloroquine (PLAQUENIL) 200 MG tablet TAKE 2 TABLETS MONDAY THROUGH FRIDAY BUT NONE ON SATURDAY OR SUNDAY.   irbesartan (AVAPRO) 75 MG tablet Take 75 mg by mouth daily.   LIOTHYRONINE SODIUM PO Take 1 tablet by mouth daily. T4, T3 compounded medication   Multiple Vitamins-Minerals (HAIR/SKIN/NAILS/BIOTIN) TABS Take 1 tablet by mouth daily.   Nutritional Supplements (DHEA) 15-50 MG CAPS Take 15 mg by mouth daily.   omeprazole (PRILOSEC) 40 MG capsule TAKE (1) CAPSULE DAILY.   Probiotic Product (PROBIOTIC-10 PO) Take by mouth.   sucralfate (CARAFATE) 1 g tablet Take 1 tablet (1 g total) by mouth 2 (two) times daily.   vitamin E 400 UNIT capsule Take by mouth.   Zinc 30 MG CAPS Take 1 capsule by mouth daily.   [DISCONTINUED] albuterol (PROVENTIL HFA;VENTOLIN HFA) 108 (90 BASE) MCG/ACT inhaler Use 2 puffs every 4-6 hours as needed   Current Facility-Administered Medications for the 04/03/21 encounter (Office Visit) with Beyonca Wisz, Eustace Quail, DO  Medication   0.9 %  sodium chloride infusion     Objective:   PHYSICAL EXAMINATION:    VITALS:   Vitals:   04/03/21 0953  BP: 131/82  Pulse: 62  SpO2: 94%  Weight: 193 lb 9.6 oz (87.8 kg)  Height: $Remove'5\' 4"'[id number]$  (1.626 m)    GEN:  The patient appears stated age and is in NAD. HEENT:  Normocephalic, atraumatic.  The mucous membranes are moist. The superficial temporal arteries are without ropiness or tenderness. CV:  RRR Lungs:  CTAB Neck/HEME:  There are no carotid bruits bilaterally.  Neurological examination:  Orientation: The patient is alert and oriented x3. Cranial nerves: There is good facial symmetry with mild facial hypomimia. The speech is fluent and clear. Soft palate rises symmetrically and there is no tongue deviation. Hearing is intact to conversational tone. Sensation: Sensation is intact to light touch throughout Motor: Strength is at least antigravity x4.  Movement examination: Tone: There is normal tone in the  bilateral upper extremities.  The tone in the lower extremities is normal.  Abnormal movements: There is right hand rest tremor that increases with distraction.  There is intention tremor bilaterally.   Coordination:  There is decremation with RAM's, only with finger taps on the right.   Gait and Station: The patient has no difficulty arising out of a deep-seated chair without the use of the hands. The patient's stride length is good.        Total time spent on today's visit was 46 minutes, including both face-to-face time and nonface-to-face time.  Time included that spent on review of records (prior notes available to me/labs/imaging if pertinent), discussing treatment and goals, answering patient's questions and coordinating care.  Cc:  Bernerd Limbo, MD

## 2021-04-03 ENCOUNTER — Other Ambulatory Visit: Payer: Self-pay

## 2021-04-03 ENCOUNTER — Ambulatory Visit: Payer: Medicare PPO | Admitting: Neurology

## 2021-04-03 ENCOUNTER — Encounter: Payer: Self-pay | Admitting: Neurology

## 2021-04-03 VITALS — BP 131/82 | HR 62 | Ht 64.0 in | Wt 193.6 lb

## 2021-04-03 DIAGNOSIS — G20 Parkinson's disease: Secondary | ICD-10-CM | POA: Diagnosis not present

## 2021-04-03 MED ORDER — CARBIDOPA-LEVODOPA 25-100 MG PO TABS: 1.0000 | ORAL_TABLET | Freq: Three times a day (TID) | ORAL | 1 refills | Status: DC

## 2021-04-03 NOTE — Patient Instructions (Signed)
Start Carbidopa Levodopa as follows: Take 1/2 tablet three times daily, at least 30 minutes before meals (approximately 7am/11am/4pm), for one week Then take 1/2 tablet in the morning, 1/2 tablet in the afternoon, 1 tablet in the evening, at least 30 minutes before meals, for one week Then take 1/2 tablet in the morning, 1 tablet in the afternoon, 1 tablet in the evening, at least 30 minutes before meals, for one week Then take 1 tablet three times daily at 7am/11am/4pm, at least 30 minutes before meals   As a reminder, carbidopa/levodopa can be taken at the same time as a carbohydrate, but we like to have you take your pill either 30 minutes before a protein source or 1 hour after as protein can interfere with carbidopa/levodopa absorption.  Here are some resources/books that you may find helpful as you navigate the challenges of Parkinson's Disease  1.  Parkinson's treatement: 10 secrets to a happier life by Andy Gauss, MD 2.  Navigating Life with Parkinsons disease by [place] 3.  My degeneration: A journey through Parkinsons [place] Pen[place] - [place]) 4.  Every Victory counts (I believe this one if free through [employer]) 5.  Lucky Man by Boykin Reaper 6.  101 Questions & Answers about Parkinson's by Donney Dice 7.  Parkinsons Disease Treatment Book by JE Ahiskog

## 2021-04-08 ENCOUNTER — Other Ambulatory Visit: Payer: Self-pay

## 2021-04-08 ENCOUNTER — Encounter: Payer: Self-pay | Admitting: Physical Therapy

## 2021-04-08 ENCOUNTER — Ambulatory Visit: Payer: Medicare PPO | Attending: Neurology | Admitting: Physical Therapy

## 2021-04-08 DIAGNOSIS — R29818 Other symptoms and signs involving the nervous system: Secondary | ICD-10-CM | POA: Diagnosis present

## 2021-04-08 DIAGNOSIS — R2681 Unsteadiness on feet: Secondary | ICD-10-CM | POA: Diagnosis present

## 2021-04-08 DIAGNOSIS — R2689 Other abnormalities of gait and mobility: Secondary | ICD-10-CM | POA: Diagnosis not present

## 2021-04-08 DIAGNOSIS — G20 Parkinson's disease: Secondary | ICD-10-CM | POA: Diagnosis not present

## 2021-04-08 DIAGNOSIS — R293 Abnormal posture: Secondary | ICD-10-CM | POA: Insufficient documentation

## 2021-04-09 NOTE — Therapy (Signed)
[facility], [place], [place], [address] Phone: [phone]   Fax:  [phone]  Physical Therapy Evaluation  Patient Details  Name: Sally Diaz MRN: [id number] Date of Birth: 1945-07-12 Referring Provider (PT): Alonza Bogus, DO   Encounter Date: 04/08/2021   PT End of Session - 04/09/21 0828     Visit Number 1    Number of Visits 13    Date for PT Re-Evaluation 05/22/21    Authorization Type [employer]    Progress Note Due on Visit 10    PT Start Time 1105    PT Stop Time 1154    PT Time Calculation (min) 49 min    Activity Tolerance Patient tolerated treatment well    Behavior During Therapy WFL for tasks assessed/performed             Past Medical History:  Diagnosis Date   Allergy, unspecified not elsewhere classified    Anemia, iron deficiency    Arthritis    Asthma    Basal cell carcinoma    Diverticulosis of colon    GERD (gastroesophageal reflux disease)    Hx of adenomatous colonic polyps    Hypercholesterolemia    Hypertension    Hypothyroidism    Obesity    Osteoporosis    Reflux esophagitis    Rheumatoid arthritis ([place])     Past Surgical History:  Procedure Laterality Date   CATARACT EXTRACTION, BILATERAL     CERVICAL CONE BIOPSY     COLONOSCOPY     ENDOMETRIAL ABLATION     TONSILLECTOMY     UPPER GASTROINTESTINAL ENDOSCOPY      There were no vitals filed for this visit.    Subjective Assessment - 04/08/21 1109     Subjective Saw Dr. Carles Collet and would like to work on balance.  Maybe would like to work on knees and ankles, because my ankles make it hard to walk.  Harder for me to keep my balance-my mind wants to go faster than my body will allow; pt does describe hastening of gait at times.  R side tremors have been going on for several years.  Have had one major fall in the past 6 months-tri[place] on a [place] on [place] trail and head hit the ground.    Patient is  accompained by: Family member   Husband   Pertinent History MH:  hypothyroidism, hypercholesterolemia, RA, DDD-lumbar spine, OA    Patient Stated Goals Whatever you can do to help me move well.    Currently in Pain? No/denies                [facility] PT Assessment - 04/08/21 1114       Assessment   Medical Diagnosis Parkinson's disease    Referring Provider (PT) Wells Guiles Tat, DO    Onset Date/Surgical Date 04/03/21    Hand Dominance Right      Precautions   Precautions Fall      Balance Screen   Has the patient fallen in the past 6 months Yes    How many times? 1   major   Has the patient had a decrease in activity level because of a fear of falling?  No    Is the patient reluctant to leave their home because of a fear of falling?  No      Home Ecologist residence    Living Arrangements Spouse/significant other  Available Help at Discharge Family    Type of [place] to enter    Entrance Stairs-Number of Steps 2    Entrance Stairs-Rails Right    Home Layout One level   One step in the house   Home Equipment --   Has a walking stick   Additional Comments Has [place] home, with 2 steps to enter; basement with handrails      Prior Function   Level of Independence Independent    Leisure Has stationary bike at home, uses daily; walking for 30 minutes 3-5 times/wk (limited with ankle swelling).  [place] the [place]; enjoys hiking and walking outdoors there.      Observation/Other Assessments   Focus on Therapeutic Outcomes (FOTO)  NA      Posture/Postural Control   Posture/Postural Control Postural limitations    Postural Limitations Forward head;Rounded Shoulders      ROM / Strength   AROM / PROM / Strength Strength;AROM      AROM   Overall AROM  Within functional limits for tasks performed    Overall AROM Comments BLEs except R ankle dorsiflexion 10 degrees      Strength   Overall Strength Within  functional limits for tasks performed    Overall Strength Comments except 4/5 ankle dorsiflexion R ankle      Transfers   Transfers Sit to Stand;Stand to Sit    Sit to Stand 6: Modified independent (Device/Increase time);Without upper extremity assist;From chair/3-in-1    Five time sit to stand comments  8.28    Stand to Sit 6: Modified independent (Device/Increase time);Without upper extremity assist;To chair/3-in-1    Comments Reports difficulty getting from lower surfaces      Ambulation/Gait   Ambulation/Gait Yes    Ambulation/Gait Assistance 5: Supervision    Ambulation Distance (Feet) 120 Feet    Assistive device None    Gait Pattern Antalgic;Lateral hip instability;Step-through pattern;Decreased trunk rotation    Ambulation Surface Level;Indoor    Gait velocity 11.63= 2.82 ft/sec      Standardized Balance Assessment   Standardized Balance Assessment Timed Up and Go Test;Mini-BESTest      Mini-BESTest   Sit To Stand Normal: Comes to stand without use of hands and stabilizes independently.    Rise to Toes < 3 s.   2.66   Stand on one leg (left) Moderate: < 20 s   1.12, 2.68   Stand on one leg (right) Severe: Unable   0.81, 0.88   Stand on one leg - lowest score 0    Compensatory Stepping Correction - Forward Normal: Recovers independently with a single, large step (second realignement is allowed).    Compensatory Stepping Correction - Backward Normal: Recovers independently with a single, large step    Compensatory Stepping Correction - Left Lateral Normal: Recovers independently with 1 step (crossover or lateral OK)    Compensatory Stepping Correction - Right Lateral Normal: Recovers independently with 1 step (crossover or lateral OK)    Stepping Corredtion Lateral - lowest score 2    Stance - Feet together, eyes open, firm surface  Normal: 30s    Stance - Feet together, eyes closed, foam surface  Normal: 30s    Incline - Eyes Closed Moderate: Stands independently < 30s OR  aligns with surface   10.6   Change in Gait Speed Normal: Significantly changes walkling speed without imbalance    Walk with head turns - Horizontal Moderate: performs head  turns with reduction in gait speed.    Walk with pivot turns Moderate:Turns with feet close SLOW (>4 steps) with good balance.   3.19   Step over obstacles Severe: Unable to step over box OR steps around box   minor LOB   Timed UP & GO with Dual Task Normal: No noticeable change in sitting, standing or walking while backward counting when compared to TUG without    Mini-BEST total score 19      Timed Up and Go Test   TUG Normal TUG;Manual TUG;Cognitive TUG    Normal TUG (seconds) 11.22    Manual TUG (seconds) 11.16    Cognitive TUG (seconds) 11.97    TUG Comments Scores >13.5-15 sec indicate increased fall risk.                        Objective measurements completed on examination: See above findings.                PT Education - 04/09/21 0828     Education Details Eval results, POC    Person(s) Educated Patient;Spouse    Methods Explanation    Comprehension Verbalized understanding              PT Short Term Goals - 04/09/21 0835       PT SHORT TERM GOAL #1   Title Pt will be independent with HEP for improved strength, balance, transfers, and gait.  TARGET 05/08/2021    Time 4    Period Weeks    Status New               PT Long Term Goals - 04/09/21 0836       PT LONG TERM GOAL #1   Title Pt will be independent with HEP for improved strength, balance, transfers, and gait.  TARGET 05/22/2021    Time 6    Period Weeks    Status New      PT LONG TERM GOAL #2   Title Pt will improve MiniBESTest score to at least 23/28 to decrease fall risk.    Baseline 19/28    Time 6    Period Weeks    Status New      PT LONG TERM GOAL #3   Title Pt will ambulate at least 1000 ft outdoor surfaces, with appropriate assistive device, no LOB, mod I for improved outdoor gait  negotiation.    Time 6    Period Weeks    Status New      PT LONG TERM GOAL #4   Title Pt will verbalize understanding of local Parkinson's disease resources, including fitness options post-d/c.    Time 6    Period Weeks    Status New                    Plan - 04/09/21 0831     Clinical Impression Statement Pt is a 76 year old female who presents to OPPT with recent dx of Parkinson's disease and recent start on Sinemet medication.  Pt primarily has R hand tremor, but L hand tremor is noted with manual dual task testing and pt does report balance changes and occasional hastening of gait.  Pt presents to OPPT with decreased balance, decreased strength (R ankle), decreased timing and coordination of gait, abnormal posture, mild postural instability.  Pt is active and enjoys hiking and walking outdoors; she would benefit from skilled PT to address the above stated  deficits to decrease fall risk and improve overall functional mobility.    Personal Factors and Comorbidities Comorbidity 3+    Comorbidities PMH:  hypothyroidism, hypercholesterolemia, RA, DDD-lumbar spine, OA    Examination-Activity Limitations Locomotion Level;Transfers;Stand    Examination-Participation Restrictions Community Activity    Stability/Clinical Decision Making Stable/Uncomplicated    Clinical Decision Making Low    Rehab Potential Good    PT Frequency 2x / week    PT Duration 6 weeks   plus eval   PT Treatment/Interventions ADLs/Self Care Home Management;Gait training;Functional mobility training;Therapeutic activities;Therapeutic exercise;Balance training;Neuromuscular re-education;Patient/family education;Manual techniques;Passive range of motion;Aquatic Therapy    PT Next Visit Plan Initiate PWR! Moves as part of HEP, educate on PD resources, gait training, balance strategy, posture training    Consulted and Agree with Plan of Care Patient             Patient will benefit from skilled therapeutic  intervention in order to improve the following deficits and impairments:  Abnormal gait, Difficulty walking, Decreased balance, Decreased mobility, Decreased strength, Postural dysfunction  Visit Diagnosis: Other abnormalities of gait and mobility  Unsteadiness on feet  Abnormal posture  Other symptoms and signs involving the nervous system     Problem List Patient Active Problem List   Diagnosis Date Noted   Parkinson's disease ([place]) 04/03/2021   Acute midline low back pain without sciatica 06/17/2016   High risk medication use 03/02/2016   DDD (degenerative disc disease), lumbar 03/02/2016   Osteoarthritis, hand 03/02/2016   Osteoarthritis, knee 03/02/2016   Acute bronchitis 07/17/2012   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 [place], [place], [address] Phone: [phone]   Fax:  [phone]  Name: Sally Diaz MRN: [id number] Date of Birth: Aug 28, 1945

## 2021-04-15 ENCOUNTER — Ambulatory Visit: Payer: Medicare PPO | Admitting: Physical Therapy

## 2021-04-15 ENCOUNTER — Encounter: Payer: Self-pay | Admitting: Physical Therapy

## 2021-04-15 ENCOUNTER — Other Ambulatory Visit: Payer: Self-pay

## 2021-04-15 DIAGNOSIS — R2689 Other abnormalities of gait and mobility: Secondary | ICD-10-CM | POA: Diagnosis not present

## 2021-04-15 DIAGNOSIS — R29818 Other symptoms and signs involving the nervous system: Secondary | ICD-10-CM

## 2021-04-15 DIAGNOSIS — R2681 Unsteadiness on feet: Secondary | ICD-10-CM

## 2021-04-15 DIAGNOSIS — R293 Abnormal posture: Secondary | ICD-10-CM

## 2021-04-15 NOTE — Therapy (Signed)
[facility], [place], [place], [address] Phone: [phone]   Fax:  [phone]  Physical Therapy Treatment  Patient Details  Name: Sally Diaz MRN: [id number] Date of Birth: 06-13-1945 Referring Provider (PT): Alonza Bogus, DO   Encounter Date: 04/15/2021   PT End of Session - 04/15/21 1303     Visit Number 2    Number of Visits 13    Date for PT Re-Evaluation 05/22/21    Authorization Type [employer]    Progress Note Due on Visit 10    PT Start Time 1102    PT Stop Time 1245    PT Time Calculation (min) 43 min    Activity Tolerance Patient tolerated treatment well    Behavior During Therapy WFL for tasks assessed/performed             Past Medical History:  Diagnosis Date   Allergy, unspecified not elsewhere classified    Anemia, iron deficiency    Arthritis    Asthma    Basal cell carcinoma    Diverticulosis of colon    GERD (gastroesophageal reflux disease)    Hx of adenomatous colonic polyps    Hypercholesterolemia    Hypertension    Hypothyroidism    Obesity    Osteoporosis    Reflux esophagitis    Rheumatoid arthritis ([place])     Past Surgical History:  Procedure Laterality Date   CATARACT EXTRACTION, BILATERAL     CERVICAL CONE BIOPSY     COLONOSCOPY     ENDOMETRIAL ABLATION     TONSILLECTOMY     UPPER GASTROINTESTINAL ENDOSCOPY      There were no vitals filed for this visit.   Subjective Assessment - 04/15/21 1109     Subjective Going today to have second injection in both knees.  Denies any falls.  Did get a cut to her R arm when bumped against piece of furniture in house.  Went to MD who prescribed antibiotic.    Patient is accompained by: Family member   Husband   Pertinent History MH:  hypothyroidism, hypercholesterolemia, RA, DDD-lumbar spine, OA    Patient Stated Goals Whatever you can do to help me move well.    Currently in Pain? No/denies               [facility] Adult PT Treatment/Exercise - 04/15/21 0001       Exercises   Exercises Knee/Hip      Knee/Hip Exercises: Aerobic   Nustep Workload 4 x 8 minutes with SPM>75 with all 4 extremities.               PWR [facility]) - 04/15/21 1308     PWR! exercises Moves in sitting    PWR! Up 20    PWR! Rock 20    PWR! Twist 20    PWR! Step 20    Comments In sitting-verbal and visual cues for intensity and tecnique.  Husband observed and provided cues for pt during session.  Performed PWR! step with only 1 leg.                  PT Education - 04/15/21 1301     Education Details PWR! moves sitting, began discussion on optimal community fitness    Person(s) Educated Patient;Spouse    Methods Explanation;Demonstration;Verbal cues;Handout    Comprehension Verbalized understanding;Need further instruction              PT  Short Term Goals - 04/09/21 0835       PT SHORT TERM GOAL #1   Title Pt will be independent with HEP for improved strength, balance, transfers, and gait.  TARGET 05/08/2021    Time 4    Period Weeks    Status New               PT Long Term Goals - 04/09/21 0836       PT LONG TERM GOAL #1   Title Pt will be independent with HEP for improved strength, balance, transfers, and gait.  TARGET 05/22/2021    Time 6    Period Weeks    Status New      PT LONG TERM GOAL #2   Title Pt will improve MiniBESTest score to at least 23/28 to decrease fall risk.    Baseline 19/28    Time 6    Period Weeks    Status New      PT LONG TERM GOAL #3   Title Pt will ambulate at least 1000 ft outdoor surfaces, with appropriate assistive device, no LOB, mod I for improved outdoor gait negotiation.    Time 6    Period Weeks    Status New      PT LONG TERM GOAL #4   Title Pt will verbalize understanding of local Parkinson's disease resources, including fitness options post-d/c.    Time 6    Period Weeks    Status New                   Plan - 04/15/21  1303     Clinical Impression Statement Initiated HEP of PWR! moves in sitting. Pt needs verbal and visual cues for technique.  Husband present and supportive and plans to assist pt with HEP.  Cont per poc.    Personal Factors and Comorbidities Comorbidity 3+    Comorbidities PMH:  hypothyroidism, hypercholesterolemia, RA, DDD-lumbar spine, OA    Examination-Activity Limitations Locomotion Level;Transfers;Stand    Examination-Participation Restrictions Community Activity    Stability/Clinical Decision Making Stable/Uncomplicated    Rehab Potential Good    PT Frequency 2x / week    PT Duration 6 weeks   plus eval   PT Treatment/Interventions ADLs/Self Care Home Management;Gait training;Functional mobility training;Therapeutic activities;Therapeutic exercise;Balance training;Neuromuscular re-education;Patient/family education;Manual techniques;Passive range of motion;Aquatic Therapy    PT Next Visit Plan Review seated PWR!  Add standing possibly, educate on PD resources, gait training, balance strategy, posture training    Consulted and Agree with Plan of Care Patient;Family member/caregiver    Family Member Consulted husband             Patient will benefit from skilled therapeutic intervention in order to improve the following deficits and impairments:  Abnormal gait, Difficulty walking, Decreased balance, Decreased mobility, Decreased strength, Postural dysfunction  Visit Diagnosis: Other abnormalities of gait and mobility  Unsteadiness on feet  Abnormal posture  Other symptoms and signs involving the nervous system     Problem List Patient Active Problem List   Diagnosis Date Noted   Parkinson's disease ([place]) 04/03/2021   Acute midline low back pain without sciatica 06/17/2016   High risk medication use 03/02/2016   DDD (degenerative disc disease), lumbar 03/02/2016   Osteoarthritis, hand 03/02/2016   Osteoarthritis, knee 03/02/2016   Acute bronchitis 07/17/2012    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[place]) 11/11/2008   History of basal cell carcinoma 06/23/2007   Hypothyroidism 06/23/2007   HYPERCHOLESTEROLEMIA 06/23/2007  Essential hypertension 06/23/2007   Seasonal and perennial allergic rhinitis 06/23/2007   OBESITY, UNSPECIFIED 05/04/2007   ESOPHAGITIS, REFLUX 06/04/2005   DIVERTICULOSIS, COLON 06/04/2005   Narda Bonds, PTA [place] 04/15/21 1:09 PM Phone: [phone] Fax: [facility] [address] [place], [place], [address] Phone: [phone]   Fax:  [phone]  Name: Sally Diaz MRN: [id number] Date of Birth: [birth year]

## 2021-04-20 ENCOUNTER — Other Ambulatory Visit: Payer: Self-pay

## 2021-04-20 ENCOUNTER — Encounter: Payer: Self-pay | Admitting: Physical Therapy

## 2021-04-20 ENCOUNTER — Ambulatory Visit: Payer: Medicare PPO | Admitting: Physical Therapy

## 2021-04-20 DIAGNOSIS — R2689 Other abnormalities of gait and mobility: Secondary | ICD-10-CM | POA: Diagnosis not present

## 2021-04-20 DIAGNOSIS — R2681 Unsteadiness on feet: Secondary | ICD-10-CM

## 2021-04-20 DIAGNOSIS — R293 Abnormal posture: Secondary | ICD-10-CM

## 2021-04-20 NOTE — Therapy (Signed)
[facility] [place], [place], [place], [address] Phone: [phone]   Fax:  [phone]  Physical Therapy Treatment  Patient Details  Name: Sally Diaz MRN: [id number] Date of Birth: 04-14-1945 Referring Provider (PT): Alonza Bogus, DO   Encounter Date: 04/20/2021   PT End of Session - 04/20/21 0934     Visit Number 3    Number of Visits 13    Date for PT Re-Evaluation 05/22/21    Authorization Type [employer]    Authorization Time Period 04/08/2021-05/22/2021    Authorization - Visit Number 3    Authorization - Number of Visits 12    Progress Note Due on Visit 10    PT Start Time 0934    PT Stop Time 1015    PT Time Calculation (min) 41 min    Activity Tolerance Patient tolerated treatment well    Behavior During Therapy [facility] for tasks assessed/performed             Past Medical History:  Diagnosis Date   Allergy, unspecified not elsewhere classified    Anemia, iron deficiency    Arthritis    Asthma    Basal cell carcinoma    Diverticulosis of colon    GERD (gastroesophageal reflux disease)    Hx of adenomatous colonic polyps    Hypercholesterolemia    Hypertension    Hypothyroidism    Obesity    Osteoporosis    Reflux esophagitis    Rheumatoid arthritis ([place])     Past Surgical History:  Procedure Laterality Date   CATARACT EXTRACTION, BILATERAL     CERVICAL CONE BIOPSY     COLONOSCOPY     ENDOMETRIAL ABLATION     TONSILLECTOMY     UPPER GASTROINTESTINAL ENDOSCOPY      There were no vitals filed for this visit.   Subjective Assessment - 04/20/21 0934     Subjective Been doing my exercises over the weekend.  The injections have helped the knees.  Did the AHOY class at [facility] last week.    Patient is accompained by: Family member   Husband   Pertinent History MH:  hypothyroidism, hypercholesterolemia, RA, DDD-lumbar spine, OA    Patient Stated Goals Whatever you can do to help  me move well.    Currently in Pain? No/denies              Neuro Re-education:    Pt performs PWR! Moves in seated position x 10 reps, reviewing addition to HEP last visit:   PWR! Up for improved posture  PWR! Rock for improved weighshifting  PWR! Twist for improved trunk rotation   PWR! Step for improved step initiation   Min cues for technique.  Discussed rationale for each PWR! Move in relation to functional activities.  Rates effort level as 5/10.    Pt performs PWR! Moves in standing position x 10 reps   PWR! Up for improved posture  PWR! Rock for improved weighshifting  PWR! Twist for improved trunk rotation-one episode of minor LOB posteriorly.  Cues to perform with back to counter at home, for stability as needed.  PWR! Step for improved step initiation   Visual, verbal, tactile cues for technique.  Rates effort level as 6-7/10 throughout.  Performed in parallel bars, with cues to use UE support at needed               [facility] Adult PT Treatment/Exercise - 04/20/21 0001  Ambulation/Gait   Ambulation/Gait Yes    Ambulation/Gait Assistance 5: Supervision    Ambulation Distance (Feet) 100 Feet   x 6   Assistive device None    Gait Pattern Lateral hip instability;Decreased trunk rotation;Step-through pattern    Ambulation Surface Level;Indoor    Gait Comments Practiced figure-8 turns, with cues for wider BOS and upright trunk for wider turns.      Knee/Hip Exercises: Aerobic   Nustep Workload 4 x 8 minutes with SPM>65-70, up to 75-80with all 4 extremities.  Rates effort level as 5/10                     PT Education - 04/20/21 1006     Education Details PWR! Moves in standing    Person(s) Educated Patient;Spouse    Methods Explanation;Demonstration;Handout    Comprehension Verbalized understanding;Returned demonstration              PT Short Term Goals - 04/20/21 1115       PT SHORT TERM GOAL #1   Title Pt will be  independent with HEP for improved strength, balance, transfers, and gait.  TARGET 05/08/2021    Time 4    Period Weeks    Status On-going               PT Long Term Goals - 04/20/21 1116       PT LONG TERM GOAL #1   Title Pt will be independent with HEP for improved strength, balance, transfers, and gait.  TARGET 05/22/2021    Time 6    Period Weeks    Status On-going      PT LONG TERM GOAL #2   Title Pt will improve MiniBESTest score to at least 23/28 to decrease fall risk.    Baseline 19/28    Time 6    Period Weeks    Status On-going      PT LONG TERM GOAL #3   Title Pt will ambulate at least 1000 ft outdoor surfaces, with appropriate assistive device, no LOB, mod I for improved outdoor gait negotiation.    Time 6    Period Weeks    Status On-going      PT LONG TERM GOAL #4   Title Pt will verbalize understanding of local Parkinson's disease resources, including fitness options post-d/c.    Time 6    Period Weeks    Status On-going                   Plan - 04/20/21 1004     Clinical Impression Statement Reviewed PWR! Moves in sitting and also worked in standing with PWR! Moves at parallel bars.  Pt with good initial understanding and performance of standing PWR! Moves, and pt eager to add to HEP to perform at home.  PT continues to reinforce increased intensity of movement patterns with PWR! Moves exercises, which pt seems to be understanding well.  With gait and turning activities, reinforced cues for slowed, deliberate pace for improved turns.  Pt tends to have narrow BOS with turns with some trunk lean, which leads to minor instability; pt will benefit from additional practice with turning.    Personal Factors and Comorbidities Comorbidity 3+    Comorbidities PMH:  hypothyroidism, hypercholesterolemia, RA, DDD-lumbar spine, OA    Examination-Activity Limitations Locomotion Level;Transfers;Stand    Examination-Participation Restrictions Community Activity     Stability/Clinical Decision Making Stable/Uncomplicated    Rehab Potential Good    PT  Frequency 2x / week    PT Duration 6 weeks   plus eval   PT Treatment/Interventions ADLs/Self Care Home Management;Gait training;Functional mobility training;Therapeutic activities;Therapeutic exercise;Balance training;Neuromuscular re-education;Patient/family education;Manual techniques;Passive range of motion;Aquatic Therapy    PT Next Visit Plan Review standing PWR!  Moves.  Work on turning strategies (figure-8, wide turns, smaller radius turns and smooth changes of direction).  Educate on PD resources, gait training, balance strategy work, compliant surfaces    Consulted and Agree with Plan of Care Patient;Family member/caregiver    Family Member Consulted husband             Patient will benefit from skilled therapeutic intervention in order to improve the following deficits and impairments:  Abnormal gait, Difficulty walking, Decreased balance, Decreased mobility, Decreased strength, Postural dysfunction  Visit Diagnosis: Abnormal posture  Unsteadiness on feet  Other abnormalities of gait and mobility     Problem List Patient Active Problem List   Diagnosis Date Noted   Parkinson's disease ([place]) 04/03/2021   Acute midline low back pain without sciatica 06/17/2016   High risk medication use 03/02/2016   DDD (degenerative disc disease), lumbar 03/02/2016   Osteoarthritis, hand 03/02/2016   Osteoarthritis, knee 03/02/2016   Acute bronchitis 07/17/2012   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, [place], [place], [address] Phone: [phone]   Fax:  [phone]  Name: MIKISHA ROSELAND MRN: [id number] Date of Birth: [birth year]

## 2021-04-23 ENCOUNTER — Ambulatory Visit: Payer: Medicare PPO | Admitting: Physical Therapy

## 2021-04-23 ENCOUNTER — Other Ambulatory Visit: Payer: Self-pay

## 2021-04-23 ENCOUNTER — Encounter: Payer: Self-pay | Admitting: Physical Therapy

## 2021-04-23 ENCOUNTER — Ambulatory Visit: Payer: Medicare PPO | Attending: Neurology | Admitting: Physical Therapy

## 2021-04-23 DIAGNOSIS — R29818 Other symptoms and signs involving the nervous system: Secondary | ICD-10-CM | POA: Insufficient documentation

## 2021-04-23 DIAGNOSIS — R2689 Other abnormalities of gait and mobility: Secondary | ICD-10-CM | POA: Insufficient documentation

## 2021-04-23 DIAGNOSIS — R2681 Unsteadiness on feet: Secondary | ICD-10-CM | POA: Insufficient documentation

## 2021-04-23 DIAGNOSIS — R293 Abnormal posture: Secondary | ICD-10-CM | POA: Diagnosis not present

## 2021-04-23 NOTE — Therapy (Signed)
[facility] [place], [place], [place], [address] Phone: [phone]   Fax:  [phone]  Physical Therapy Treatment  Patient Details  Name: DEETTE REVAK MRN: [id number] Date of Birth: May 28, 1945 Referring Provider (PT): Alonza Bogus, DO   Encounter Date: 04/23/2021   PT End of Session - 04/23/21 0938     Visit Number 4    Number of Visits 13    Date for PT Re-Evaluation 05/22/21    Authorization Type [employer]    Authorization Time Period 04/08/2021-05/22/2021    Authorization - Visit Number 4    Authorization - Number of Visits 12    Progress Note Due on Visit 10    PT Start Time 2440    PT Stop Time 0931    PT Time Calculation (min) 44 min    Activity Tolerance Patient tolerated treatment well    Behavior During Therapy [facility] for tasks assessed/performed             Past Medical History:  Diagnosis Date   Allergy, unspecified not elsewhere classified    Anemia, iron deficiency    Arthritis    Asthma    Basal cell carcinoma    Diverticulosis of colon    GERD (gastroesophageal reflux disease)    Hx of adenomatous colonic polyps    Hypercholesterolemia    Hypertension    Hypothyroidism    Obesity    Osteoporosis    Reflux esophagitis    Rheumatoid arthritis ([place])     Past Surgical History:  Procedure Laterality Date   CATARACT EXTRACTION, BILATERAL     CERVICAL CONE BIOPSY     COLONOSCOPY     ENDOMETRIAL ABLATION     TONSILLECTOMY     UPPER GASTROINTESTINAL ENDOSCOPY      There were no vitals filed for this visit.   Subjective Assessment - 04/23/21 0851     Subjective Denies any falls or chanes.  Gets last injections for knees today.  "They are better.  Seems like if it's not the knees its the ankles."    Patient is accompained by: Family member   Husband   Pertinent History MH:  hypothyroidism, hypercholesterolemia, RA, DDD-lumbar spine, OA    Patient Stated Goals Whatever you can do to  help me move well.    Currently in Pain? No/denies             Neuro ReEducation  Reviewed and performed standing PWR! Moves near counter for support if needed.  Pt needs cues for intensity and to slow down at times.  Only needed UE support x 2 from counter.    Performed Supine PWR! Moves and provided as HEP.  Performed 20 reps all 4 moves.  Pt needs cues for intensity again and slowed movements.  Performed PWR! Hands in seated position and provided as HEP.  Performed 10 reps each all 4 moves.      [facility] Adult PT Treatment/Exercise - 04/23/21 0001       Transfers   Transfers Sit to Stand;Stand to Sit    Sit to Stand 6: Modified independent (Device/Increase time);Without upper extremity assist;From chair/3-in-1    Stand to Sit 6: Modified independent (Device/Increase time);Without upper extremity assist;To chair/3-in-1      Exercises   Exercises Knee/Hip      Knee/Hip Exercises: Aerobic   Other Aerobic Scifit level 1.6 all 4 extremities with rpm>55 x 6-7 minutes.  Pt reports intensity level 8/10.  Needed  seated rest break after Scifit.                     PT Education - 04/23/21 0937     Education Details PWR! Supine, PWR! Hands    Person(s) Educated Patient;Spouse    Methods Explanation;Demonstration;Verbal cues;Handout    Comprehension Verbalized understanding;Returned demonstration              PT Short Term Goals - 04/20/21 1115       PT SHORT TERM GOAL #1   Title Pt will be independent with HEP for improved strength, balance, transfers, and gait.  TARGET 05/08/2021    Time 4    Period Weeks    Status On-going               PT Long Term Goals - 04/20/21 1116       PT LONG TERM GOAL #1   Title Pt will be independent with HEP for improved strength, balance, transfers, and gait.  TARGET 05/22/2021    Time 6    Period Weeks    Status On-going      PT LONG TERM GOAL #2   Title Pt will improve MiniBESTest score to at least 23/28 to  decrease fall risk.    Baseline 19/28    Time 6    Period Weeks    Status On-going      PT LONG TERM GOAL #3   Title Pt will ambulate at least 1000 ft outdoor surfaces, with appropriate assistive device, no LOB, mod I for improved outdoor gait negotiation.    Time 6    Period Weeks    Status On-going      PT LONG TERM GOAL #4   Title Pt will verbalize understanding of local Parkinson's disease resources, including fitness options post-d/c.    Time 6    Period Weeks    Status On-going                   Plan - 04/23/21 0939     Clinical Impression Statement Reviewed PWR! Moves standing and performed PWR! moves supine and PWR! hands.  Pt reports feeling stronger and increased flexibility since beginning therapy.  Needs cues for intensity and to slow down movements at times.  Cont per poc.    Personal Factors and Comorbidities Comorbidity 3+    Comorbidities PMH:  hypothyroidism, hypercholesterolemia, RA, DDD-lumbar spine, OA    Examination-Activity Limitations Locomotion Level;Transfers;Stand    Examination-Participation Restrictions Community Activity    Stability/Clinical Decision Making Stable/Uncomplicated    Rehab Potential Good    PT Frequency 2x / week    PT Duration 6 weeks   plus eval   PT Treatment/Interventions ADLs/Self Care Home Management;Gait training;Functional mobility training;Therapeutic activities;Therapeutic exercise;Balance training;Neuromuscular re-education;Patient/family education;Manual techniques;Passive range of motion;Aquatic Therapy    PT Next Visit Plan Review supine PWR!  Moves and PWR! hands.  Should we add prone and quadruped/modified quadruped in prep for PWR! moves class if part of her community fitness.  Work on turning strategies (figure-8, wide turns, smaller radius turns and smooth changes of direction).  Educate on PD resources, gait training, balance strategy work, compliant surfaces    PT Home Exercise Plan PWR! supine, standing,  sitting and hands    Consulted and Agree with Plan of Care Patient;Family member/caregiver    Family Member Consulted husband             Patient will benefit from skilled therapeutic intervention in  order to improve the following deficits and impairments:  Abnormal gait, Difficulty walking, Decreased balance, Decreased mobility, Decreased strength, Postural dysfunction  Visit Diagnosis: Abnormal posture  Other abnormalities of gait and mobility  Unsteadiness on feet  Other symptoms and signs involving the nervous system     Problem List Patient Active Problem List   Diagnosis Date Noted   Parkinson's disease ([place]) 04/03/2021   Acute midline low back pain without sciatica 06/17/2016   High risk medication use 03/02/2016   DDD (degenerative disc disease), lumbar 03/02/2016   Osteoarthritis, hand 03/02/2016   Osteoarthritis, knee 03/02/2016   Acute bronchitis 07/17/2012   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

## 2021-04-24 ENCOUNTER — Telehealth: Payer: Self-pay | Admitting: Neurology

## 2021-04-24 NOTE — Telephone Encounter (Signed)
Patient stated her blood pressure has been rising and she cant figure out why. She wants to know what she needs to do. Could it be carbi/levo? Stated tat told her it may go down taking this medicine but it seems to stay high.

## 2021-04-24 NOTE — Telephone Encounter (Signed)
Called patient back and let her know to call PCP to find out about the blood pressure

## 2021-04-27 ENCOUNTER — Other Ambulatory Visit: Payer: Self-pay

## 2021-04-27 ENCOUNTER — Ambulatory Visit: Payer: Medicare PPO | Admitting: Physical Therapy

## 2021-04-27 ENCOUNTER — Encounter: Payer: Self-pay | Admitting: Physical Therapy

## 2021-04-27 VITALS — BP 161/104 | HR 79

## 2021-04-27 DIAGNOSIS — R293 Abnormal posture: Secondary | ICD-10-CM | POA: Diagnosis not present

## 2021-04-27 DIAGNOSIS — R2681 Unsteadiness on feet: Secondary | ICD-10-CM

## 2021-04-27 NOTE — Therapy (Signed)
[facility] [place], [place], [place], [address] Phone: [phone]   Fax:  [phone]  Physical Therapy Treatment  Patient Details  Name: Sally Diaz MRN: [id number] Date of Birth: [birth year] Referring Provider (PT): Alonza Bogus, DO   Encounter Date: 04/27/2021   PT End of Session - 04/27/21 1058     Visit Number 5    Number of Visits 13    Date for PT Re-Evaluation 05/22/21    Authorization Type [employer]    Authorization Time Period 04/08/2021-05/22/2021    Authorization - Visit Number 5    Authorization - Number of Visits 12    Progress Note Due on Visit 10    PT Start Time 1020    PT Stop Time 1046    PT Time Calculation (min) 26 min    Activity Tolerance Patient tolerated treatment well    Behavior During Therapy [facility] for tasks assessed/performed             Past Medical History:  Diagnosis Date   Allergy, unspecified not elsewhere classified    Anemia, iron deficiency    Arthritis    Asthma    Basal cell carcinoma    Diverticulosis of colon    GERD (gastroesophageal reflux disease)    Hx of adenomatous colonic polyps    Hypercholesterolemia    Hypertension    Hypothyroidism    Obesity    Osteoporosis    Reflux esophagitis    Rheumatoid arthritis ([place])     Past Surgical History:  Procedure Laterality Date   CATARACT EXTRACTION, BILATERAL     CERVICAL CONE BIOPSY     COLONOSCOPY     ENDOMETRIAL ABLATION     TONSILLECTOMY     UPPER GASTROINTESTINAL ENDOSCOPY      Vitals:   04/27/21 1028 04/27/21 1029 04/27/21 1038  BP: (!) 184/102 (!) 163/99 (!) 161/104  Pulse: 79 79 79     Subjective Assessment - 04/27/21 1022     Subjective Blood pressure was rising so I talked with Dr. Carles Collet and my family doctor; have been hydrating more and doctor upped my BP medication.  It's better.    Patient is accompained by: Family member   Husband   Pertinent History MH:  hypothyroidism,  hypercholesterolemia, RA, DDD-lumbar spine, OA    Patient Stated Goals Whatever you can do to help me move well.    Currently in Pain? No/denies                               [facility] Adult PT Treatment/Exercise - 04/27/21 0001       Self-Care   Self-Care Other Self-Care Comments    Other Self-Care Comments  Discussed blood pressure parameters in regards to activity.  Discussed CVA signs/symptoms and warning signs-pt having no symptoms.  Discussed potential follow-up with MD regarding BP and having new addition of medication started over the weekend.  Discussed current exercise program:  using stationary bike at home 10 minutes; walking outdoors (if weather is nice) about 30 minutes.  Doing PWR! Moves seated and standing, PWR! Hands daily.                       PT Short Term Goals - 04/20/21 1115       PT SHORT TERM GOAL #1   Title Pt will be independent with HEP for improved strength,  balance, transfers, and gait.  TARGET 05/08/2021    Time 4    Period Weeks    Status On-going               PT Long Term Goals - 04/20/21 1116       PT LONG TERM GOAL #1   Title Pt will be independent with HEP for improved strength, balance, transfers, and gait.  TARGET 05/22/2021    Time 6    Period Weeks    Status On-going      PT LONG TERM GOAL #2   Title Pt will improve MiniBESTest score to at least 23/28 to decrease fall risk.    Baseline 19/28    Time 6    Period Weeks    Status On-going      PT LONG TERM GOAL #3   Title Pt will ambulate at least 1000 ft outdoor surfaces, with appropriate assistive device, no LOB, mod I for improved outdoor gait negotiation.    Time 6    Period Weeks    Status On-going      PT LONG TERM GOAL #4   Title Pt will verbalize understanding of local Parkinson's disease resources, including fitness options post-d/c.    Time 6    Period Weeks    Status On-going                   Plan - 04/27/21 1042      Clinical Impression Statement Education provided today in shortened session, as pt's BP elevated beyond safe parameters for increased activity.  No signs and symptoms, but educated pt in gentle activity at home, follow up with MD as needed and signs/symptoms of increased BP and CVA.    Personal Factors and Comorbidities Comorbidity 3+    Comorbidities PMH:  hypothyroidism, hypercholesterolemia, RA, DDD-lumbar spine, OA    Examination-Activity Limitations Locomotion Level;Transfers;Stand    Examination-Participation Restrictions Community Activity    Stability/Clinical Decision Making Stable/Uncomplicated    Rehab Potential Good    PT Frequency 2x / week    PT Duration 6 weeks   plus eval   PT Treatment/Interventions ADLs/Self Care Home Management;Gait training;Functional mobility training;Therapeutic activities;Therapeutic exercise;Balance training;Neuromuscular re-education;Patient/family education;Manual techniques;Passive range of motion;Aquatic Therapy    PT Next Visit Plan Check BP measure at beginning of session and with activity.  Review supine PWR!  Moves and PWR! hands.  Should we add prone and quadruped/modified quadruped in prep for PWR! moves class if part of her community fitness.  Work on turning strategies (figure-8, wide turns, smaller radius turns and smooth changes of direction).  Educate on PD resources, gait training, balance strategy work, compliant surfaces    PT Home Exercise Plan PWR! supine, standing, sitting and hands    Consulted and Agree with Plan of Care Patient;Family member/caregiver    Family Member Consulted husband             Patient will benefit from skilled therapeutic intervention in order to improve the following deficits and impairments:  Abnormal gait, Difficulty walking, Decreased balance, Decreased mobility, Decreased strength, Postural dysfunction  Visit Diagnosis: Unsteadiness on feet     Problem List Patient Active Problem List   Diagnosis  Date Noted   Parkinson's disease ([place]) 04/03/2021   Acute midline low back pain without sciatica 06/17/2016   High risk medication use 03/02/2016   DDD (degenerative disc disease), lumbar 03/02/2016   Osteoarthritis, hand 03/02/2016   Osteoarthritis, knee 03/02/2016   Acute bronchitis 07/17/2012  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, [place], [place], [address] Phone: [phone]   Fax:  [phone]  Name: Sally Diaz MRN: [id number] Date of Birth: 08-28-1945

## 2021-04-29 ENCOUNTER — Ambulatory Visit: Payer: Medicare PPO | Admitting: Physical Therapy

## 2021-04-29 ENCOUNTER — Other Ambulatory Visit: Payer: Self-pay

## 2021-04-29 VITALS — BP 183/95 | HR 76

## 2021-04-29 DIAGNOSIS — R293 Abnormal posture: Secondary | ICD-10-CM | POA: Diagnosis not present

## 2021-04-29 DIAGNOSIS — R2689 Other abnormalities of gait and mobility: Secondary | ICD-10-CM

## 2021-04-29 DIAGNOSIS — R29818 Other symptoms and signs involving the nervous system: Secondary | ICD-10-CM

## 2021-04-29 NOTE — Patient Instructions (Addendum)
°  Measures during PT session 04/27/2021:   Vitals:    04/27/21 1028 04/27/21 1029 04/27/21 1038  BP: (!) 184/102 (!) 163/99 (!) 161/104  Pulse: 79 79 79      04/29/2021: Pre-session       178/93 After seated activity and short distance gait:    173/99 After seated rest:      181/88   PT parameters are to avoid increased activity level if diastolic BP measure is >22.  Could you please assist pt/advise regarding BP given these measures at her PT sessions?

## 2021-04-29 NOTE — Therapy (Signed)
[facility] [place], [place], [place], [address] Phone: [phone]   Fax:  [phone]  Physical Therapy Treatment  Patient Details  Name: Sally Diaz MRN: [id number] Date of Birth: Jul 18, 1945 Referring Provider (PT): Alonza Bogus, DO   Encounter Date: 04/29/2021   PT End of Session - 04/29/21 1323     Visit Number 6    Number of Visits 13    Date for PT Re-Evaluation 05/22/21    Authorization Type [employer]    Authorization Time Period 04/08/2021-05/22/2021    Authorization - Visit Number 6    Authorization - Number of Visits 12    Progress Note Due on Visit 10    PT Start Time 1320    PT Stop Time 1355    PT Time Calculation (min) 35 min    Activity Tolerance Patient tolerated treatment well   elevated BP, but no symptoms   Behavior During Therapy WFL for tasks assessed/performed             Past Medical History:  Diagnosis Date   Allergy, unspecified not elsewhere classified    Anemia, iron deficiency    Arthritis    Asthma    Basal cell carcinoma    Diverticulosis of colon    GERD (gastroesophageal reflux disease)    Hx of adenomatous colonic polyps    Hypercholesterolemia    Hypertension    Hypothyroidism    Obesity    Osteoporosis    Reflux esophagitis    Rheumatoid arthritis ([place])     Past Surgical History:  Procedure Laterality Date   CATARACT EXTRACTION, BILATERAL     CERVICAL CONE BIOPSY     COLONOSCOPY     ENDOMETRIAL ABLATION     TONSILLECTOMY     UPPER GASTROINTESTINAL ENDOSCOPY      Vitals:   04/29/21 1324 04/29/21 1326  BP: (!) 178/93 (!) 183/95  Pulse: 78 76     Subjective Assessment - 04/29/21 1321     Subjective Been checking BP and it hasn't been too high in the past couple of days.  At home, was doing PWR! Moves (2-3 positions), cycling at home 20 minutes, walkng.  Did aerobics class ([employer]) yesterday.    Patient is accompained by: Family member   Husband    Pertinent History MH:  hypothyroidism, hypercholesterolemia, RA, DDD-lumbar spine, OA    Patient Stated Goals Whatever you can do to help me move well.    Currently in Pain? No/denies                     Pt performs PWR! Moves in seated position x 10 reps   PWR! Up for improved posture  PWR! Rock for improved weighshifting  PWR! Twist for improved trunk rotation   PWR! Step for improved step initiation   Cues provided for intensity and technique.  Overall, pt demo good understanding of seated PWR! Moves.           [facility] Adult PT Treatment/Exercise - 04/29/21 0001       Ambulation/Gait   Ambulation/Gait Yes    Ambulation/Gait Assistance 5: Supervision    Ambulation Distance (Feet) 255 Feet    Assistive device None    Gait Pattern Lateral hip instability;Decreased trunk rotation;Step-through pattern    Ambulation Surface Level;Indoor                     PT Education - 04/29/21 1726  Education Details Discussed pt's activity level and trying to do exercises in short bouts, resting between, to follow up with MD if needed regarding BP    Person(s) Educated Patient;Spouse    Methods Explanation    Comprehension Verbalized understanding              PT Short Term Goals - 04/20/21 1115       PT SHORT TERM GOAL #1   Title Pt will be independent with HEP for improved strength, balance, transfers, and gait.  TARGET 05/08/2021    Time 4    Period Weeks    Status On-going               PT Long Term Goals - 04/20/21 1116       PT LONG TERM GOAL #1   Title Pt will be independent with HEP for improved strength, balance, transfers, and gait.  TARGET 05/22/2021    Time 6    Period Weeks    Status On-going      PT LONG TERM GOAL #2   Title Pt will improve MiniBESTest score to at least 23/28 to decrease fall risk.    Baseline 19/28    Time 6    Period Weeks    Status On-going      PT LONG TERM GOAL #3   Title Pt will ambulate at  least 1000 ft outdoor surfaces, with appropriate assistive device, no LOB, mod I for improved outdoor gait negotiation.    Time 6    Period Weeks    Status On-going      PT LONG TERM GOAL #4   Title Pt will verbalize understanding of local Parkinson's disease resources, including fitness options post-d/c.    Time 6    Period Weeks    Status On-going                   Plan - 04/29/21 1728     Clinical Impression Statement Reviewed seated PWR! Moves today, with pt demonstrating good understanding of exercises, as well as gentle walking prior to standing PWR! Moves.  Did not perform standing PWR! Moves today, due to pt's elevated BP.  Pt has added new dose of BP medication and plans to follow up with MD regarding BP if needed.    Personal Factors and Comorbidities Comorbidity 3+    Comorbidities PMH:  hypothyroidism, hypercholesterolemia, RA, DDD-lumbar spine, OA    Examination-Activity Limitations Locomotion Level;Transfers;Stand    Examination-Participation Restrictions Community Activity    Stability/Clinical Decision Making Stable/Uncomplicated    Rehab Potential Good    PT Frequency 2x / week    PT Duration 6 weeks   plus eval   PT Treatment/Interventions ADLs/Self Care Home Management;Gait training;Functional mobility training;Therapeutic activities;Therapeutic exercise;Balance training;Neuromuscular re-education;Patient/family education;Manual techniques;Passive range of motion;Aquatic Therapy    PT Next Visit Plan Check BP measure at beginning of session and with activity.  Review supine PWR!  Moves and PWR! hands. (pt not doing supine PWR! Moves at home yet, per report)    Work on turning strategies (figure-8, wide turns, smaller radius turns and smooth changes of direction).  Educate on PD resources, gait training, balance strategy work, compliant surfaces    PT Home Exercise Plan PWR! supine, standing, sitting and hands    Consulted and Agree with Plan of Care  Patient;Family member/caregiver    Family Member Consulted husband             Patient will benefit from skilled  therapeutic intervention in order to improve the following deficits and impairments:  Abnormal gait, Difficulty walking, Decreased balance, Decreased mobility, Decreased strength, Postural dysfunction  Visit Diagnosis: Abnormal posture  Other symptoms and signs involving the nervous system  Other abnormalities of gait and mobility     Problem List Patient Active Problem List   Diagnosis Date Noted   Parkinson's disease ([place]) 04/03/2021   Acute midline low back pain without sciatica 06/17/2016   High risk medication use 03/02/2016   DDD (degenerative disc disease), lumbar 03/02/2016   Osteoarthritis, hand 03/02/2016   Osteoarthritis, knee 03/02/2016   Acute bronchitis 07/17/2012   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, [place], [place], [address] Phone: [phone]   Fax:  [phone]  Name: Sally Diaz MRN: [id number] Date of Birth: 02-Jul-1945

## 2021-05-04 ENCOUNTER — Other Ambulatory Visit: Payer: Self-pay

## 2021-05-04 ENCOUNTER — Ambulatory Visit: Payer: Medicare PPO | Admitting: Physical Therapy

## 2021-05-04 ENCOUNTER — Encounter: Payer: Self-pay | Admitting: Physical Therapy

## 2021-05-04 VITALS — BP 158/88

## 2021-05-04 DIAGNOSIS — R2689 Other abnormalities of gait and mobility: Secondary | ICD-10-CM

## 2021-05-04 DIAGNOSIS — R29818 Other symptoms and signs involving the nervous system: Secondary | ICD-10-CM

## 2021-05-04 DIAGNOSIS — R293 Abnormal posture: Secondary | ICD-10-CM | POA: Diagnosis not present

## 2021-05-04 DIAGNOSIS — R2681 Unsteadiness on feet: Secondary | ICD-10-CM

## 2021-05-04 NOTE — Therapy (Signed)
[facility] [place], [place], [place], [address] Phone: [phone]   Fax:  [phone]  Physical Therapy Treatment  Patient Details  Name: Sally Diaz MRN: [id number] Date of Birth: Jun 27, 1945 Referring Provider (PT): Alonza Bogus, DO   Encounter Date: 05/04/2021   PT End of Session - 05/04/21 1233     Visit Number 7    Number of Visits 13    Date for PT Re-Evaluation 05/22/21    Authorization Type [employer]    Authorization Time Period 04/08/2021-05/22/2021    Authorization - Visit Number 7    Authorization - Number of Visits 12    Progress Note Due on Visit 10    PT Start Time 1233    PT Stop Time 1316    PT Time Calculation (min) 43 min    Activity Tolerance Patient tolerated treatment well   elevated BP, but no symptoms   Behavior During Therapy WFL for tasks assessed/performed             Past Medical History:  Diagnosis Date   Allergy, unspecified not elsewhere classified    Anemia, iron deficiency    Arthritis    Asthma    Basal cell carcinoma    Diverticulosis of colon    GERD (gastroesophageal reflux disease)    Hx of adenomatous colonic polyps    Hypercholesterolemia    Hypertension    Hypothyroidism    Obesity    Osteoporosis    Reflux esophagitis    Rheumatoid arthritis ([place])     Past Surgical History:  Procedure Laterality Date   CATARACT EXTRACTION, BILATERAL     CERVICAL CONE BIOPSY     COLONOSCOPY     ENDOMETRIAL ABLATION     TONSILLECTOMY     UPPER GASTROINTESTINAL ENDOSCOPY      Vitals:   05/04/21 1237 05/04/21 1306  BP: (!) 169/92 (!) 158/88     Subjective Assessment - 05/04/21 1232     Subjective BP been okay.  No other changes.  Tell me more about the exercise class that I got an email about.    Patient is accompained by: Family member   Husband   Pertinent History MH:  hypothyroidism, hypercholesterolemia, RA, DDD-lumbar spine, OA    Patient Stated Goals  Whatever you can do to help me move well.    Currently in Pain? No/denies                      Pt performs PWR! Moves in standing position x 20 reps   PWR! Up for improved posture   PWR! Rock for improved weighshifting   PWR! Twist for improved trunk rotation-one episode of minor LOB posteriorly.     PWR! Step for improved step initiation  Review of HEP from several visits ago-uses counter if needed.  She return demo understanding.           [facility] Adult PT Treatment/Exercise - 05/04/21 0001       High Level Balance   High Level Balance Activities Turns;Weight-shifting turns    High Level Balance Comments Short distance walking with weigthshifting/rocking turns x 4 reps.  Cues for upright posture.      Self-Care   Self-Care Other Self-Care Comments    Other Self-Care Comments  Discussed and provided information on PWR! Moves exercise-new addition to schedule on Fridays at [facility].  Discussed [employer] over [employer] community group.      Neuro  Re-ed    Neuro Re-ed Details  Forward step and weightshift over obstacle, 10 reps with 1 UE support.  Back step and weightshift/return to midline, x 10 reps, BUE support.  Performed on solid and on compliant ([employer]) surface.             Alt step taps to cones, x 10 reps, cues to slow pace and increased foot clearance, min guard for balance.    Balance Exercises - 05/04/21 0001       Balance Exercises: Standing   Other Standing Exercises Standing on [employer]ire[employer]:  marching in place 2 x 10, forward kicks, alternating legs x 10                PT Education - 05/04/21 1305     Education Details PWR! Moves classes and POP meeting information              PT Short Term Goals - 04/20/21 1115       PT SHORT TERM GOAL #1   Title Pt will be independent with HEP for improved strength, balance, transfers, and gait.  TARGET 05/08/2021    Time 4    Period Weeks    Status On-going               PT Long  Term Goals - 04/20/21 1116       PT LONG TERM GOAL #1   Title Pt will be independent with HEP for improved strength, balance, transfers, and gait.  TARGET 05/22/2021    Time 6    Period Weeks    Status On-going      PT LONG TERM GOAL #2   Title Pt will improve MiniBESTest score to at least 23/28 to decrease fall risk.    Baseline 19/28    Time 6    Period Weeks    Status On-going      PT LONG TERM GOAL #3   Title Pt will ambulate at least 1000 ft outdoor surfaces, with appropriate assistive device, no LOB, mod I for improved outdoor gait negotiation.    Time 6    Period Weeks    Status On-going      PT LONG TERM GOAL #4   Title Pt will verbalize understanding of local Parkinson's disease resources, including fitness options post-d/c.    Time 6    Period Weeks    Status On-going                   Plan - 05/04/21 1316     Clinical Impression Statement Reviewed standing PWR! Moves exercises in session today, with pt demo good understanding of exercises.  Pt's BP stays within appropriate parameters in session today.  Focused on single limb stance, compliant surfaces, and turning techniques.  Pt needs cues throughout session to slow pace, and with slowed pace, she demonstrates improved overall movement patterns.    Personal Factors and Comorbidities Comorbidity 3+    Comorbidities PMH:  hypothyroidism, hypercholesterolemia, RA, DDD-lumbar spine, OA    Examination-Activity Limitations Locomotion Level;Transfers;Stand    Examination-Participation Restrictions Community Activity    Stability/Clinical Decision Making Stable/Uncomplicated    Rehab Potential Good    PT Frequency 2x / week    PT Duration 6 weeks   plus eval   PT Treatment/Interventions ADLs/Self Care Home Management;Gait training;Functional mobility training;Therapeutic activities;Therapeutic exercise;Balance training;Neuromuscular re-education;Patient/family education;Manual techniques;Passive range of  motion;Aquatic Therapy    PT Next Visit Plan Check BP measure at beginning of session  and with activity.  Review supine PWR!  Moves and PWR! hands. (pt not doing supine PWR! Moves at home yet, per report)    Work on turning strategies (figure-8, wide turns, smaller radius turns and smooth changes of direction).  Check STG (discuss adding of appts)    PT Home Exercise Plan PWR! supine, standing, sitting and hands    Consulted and Agree with Plan of Care Patient;Family member/caregiver    Family Member Consulted husband             Patient will benefit from skilled therapeutic intervention in order to improve the following deficits and impairments:  Abnormal gait, Difficulty walking, Decreased balance, Decreased mobility, Decreased strength, Postural dysfunction  Visit Diagnosis: Other abnormalities of gait and mobility  Unsteadiness on feet  Other symptoms and signs involving the nervous system     Problem List Patient Active Problem List   Diagnosis Date Noted   Parkinson's disease (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 [place], [place], [address] Phone: [phone]   Fax:  [phone]  Name: Sally Diaz MRN: [id number] Date of Birth: [birth year]

## 2021-05-06 ENCOUNTER — Other Ambulatory Visit: Payer: Self-pay

## 2021-05-06 ENCOUNTER — Ambulatory Visit: Payer: Medicare PPO | Admitting: Physical Therapy

## 2021-05-06 ENCOUNTER — Encounter: Payer: Self-pay | Admitting: Physical Therapy

## 2021-05-06 DIAGNOSIS — R29818 Other symptoms and signs involving the nervous system: Secondary | ICD-10-CM

## 2021-05-06 DIAGNOSIS — R293 Abnormal posture: Secondary | ICD-10-CM | POA: Diagnosis not present

## 2021-05-06 DIAGNOSIS — R2689 Other abnormalities of gait and mobility: Secondary | ICD-10-CM

## 2021-05-06 DIAGNOSIS — R2681 Unsteadiness on feet: Secondary | ICD-10-CM

## 2021-05-06 NOTE — Therapy (Signed)
[facility] [place], [place], [place], [address] Phone: [phone]   Fax:  [phone]  Physical Therapy Treatment  Patient Details  Name: Sally Diaz MRN: [id number] Date of Birth: [birth year] Referring Provider (PT): Alonza Bogus, DO   Encounter Date: 05/06/2021   PT End of Session - 05/06/21 1407     Visit Number 8    Number of Visits 13    Date for PT Re-Evaluation 05/22/21    Authorization Type [employer]    Authorization Time Period 04/08/2021-05/22/2021    Authorization - Visit Number 8    Authorization - Number of Visits 12    Progress Note Due on Visit 10    PT Start Time 1319    PT Stop Time 1400    PT Time Calculation (min) 41 min    Activity Tolerance Patient tolerated treatment well   elevated BP, but no symptoms   Behavior During Therapy WFL for tasks assessed/performed             Past Medical History:  Diagnosis Date   Allergy, unspecified not elsewhere classified    Anemia, iron deficiency    Arthritis    Asthma    Basal cell carcinoma    Diverticulosis of colon    GERD (gastroesophageal reflux disease)    Hx of adenomatous colonic polyps    Hypercholesterolemia    Hypertension    Hypothyroidism    Obesity    Osteoporosis    Reflux esophagitis    Rheumatoid arthritis ([place])     Past Surgical History:  Procedure Laterality Date   CATARACT EXTRACTION, BILATERAL     CERVICAL CONE BIOPSY     COLONOSCOPY     ENDOMETRIAL ABLATION     TONSILLECTOMY     UPPER GASTROINTESTINAL ENDOSCOPY      There were no vitals filed for this visit.   Subjective Assessment - 05/06/21 1314     Subjective BP been okay.  Been feeling pretty good this week.    Patient is accompained by: Family member   Husband   Pertinent History MH:  hypothyroidism, hypercholesterolemia, RA, DDD-lumbar spine, OA    Patient Stated Goals Whatever you can do to help me move well.    Currently in Pain? No/denies                Full review of HEP, with pt return demo understanding of PWR! Moves in sitting and standing, 10 reps each side.  Pt performs PWR! Moves in modified position 5 reps   PWR! Up for improved posture  PWR! Rock for improved weighshifting, 5 reps each side  PWR! Twist for improved trunk rotation, 5 reps each side  PWR! Step for improved step initiation, 5 reps each side  Performed at the back of a chair, with PT providing visual and verbal cues.    Pt performs PWR! Moves in supine position (on mat) x 10 reps   PWR! Up for improved posture  PWR! Rock for improved weighshifting  PWR! Twist for improved trunk rotation   PWR! Step for improved step initiation-bilat step out and in  Cues for technique to modify performance to prevent back pain.                [facility] Adult PT Treatment/Exercise - 05/06/21 0001       Ambulation/Gait   Ambulation/Gait Yes    Ambulation/Gait Assistance 5: Supervision    Ambulation Distance (Feet) 340 Feet  Assistive device None    Gait Pattern Lateral hip instability;Decreased trunk rotation;Step-through pattern    Ambulation Surface Level;Indoor    Gait Comments Stops/starts with gait, conversation with gait.  2 episodes of stopping up on toes, but pt able to regain balance.      Self-Care   Self-Care Other Self-Care Comments    Other Self-Care Comments  Reviewed information about PWR! Moves exercise classes and benefits, modifications.                     PT Education - 05/06/21 1406     Education Details PWR! Moves Friday classes-waivers and profile information; benefits and modifications used in class    Person(s) Educated Patient    Methods Explanation;Demonstration;Verbal cues    Comprehension Verbalized understanding;Returned demonstration              PT Short Term Goals - 05/06/21 1315       PT SHORT TERM GOAL #1   Title Pt will be independent with HEP for improved strength, balance,  transfers, and gait.  TARGET 05/08/2021    Time 4    Period Weeks    Status Achieved               PT Long Term Goals - 05/06/21 1411       PT LONG TERM GOAL #1   Title Pt will be independent with HEP for improved strength, balance, transfers, and gait.  TARGET 05/22/2021    Time 6    Period Weeks    Status On-going      PT LONG TERM GOAL #2   Title Pt will improve MiniBESTest score to at least 23/28 to decrease fall risk.    Baseline 19/28    Time 6    Period Weeks    Status On-going      PT LONG TERM GOAL #3   Title Pt will ambulate at least 1000 ft outdoor surfaces, with appropriate assistive device, no LOB, mod I for improved outdoor gait negotiation.    Time 6    Period Weeks    Status On-going      PT LONG TERM GOAL #4   Title Pt will verbalize understanding of local Parkinson's disease resources, including fitness options post-d/c.    Time 6    Period Weeks    Status On-going                   Plan - 05/06/21 1407     Clinical Impression Statement Assessed STGs this visit, with pt meeting 1 STG.  Pt is demonstrating good understanding of standing and sitting PWR! Moves.  Discussed new community exercise class and pt would likely benefit from this class, even though she may need some modifications due to hx of knee and back pain.  Had a brief performance of each (modified) PWR! Move position, and pt able to perform with cues.  Pt will continue to beneift from skilled PT towards LTGs, to addres more high level balance and gait for imrpoved overall functional mobility and decreased fall risk.    Personal Factors and Comorbidities Comorbidity 3+    Comorbidities PMH:  hypothyroidism, hypercholesterolemia, RA, DDD-lumbar spine, OA    Examination-Activity Limitations Locomotion Level;Transfers;Stand    Examination-Participation Restrictions Community Activity    Stability/Clinical Decision Making Stable/Uncomplicated    Rehab Potential Good    PT Frequency 2x  / week    PT Duration 6 weeks   plus eval  PT Treatment/Interventions ADLs/Self Care Home Management;Gait training;Functional mobility training;Therapeutic activities;Therapeutic exercise;Balance training;Neuromuscular re-education;Patient/family education;Manual techniques;Passive range of motion;Aquatic Therapy    PT Next Visit Plan Check BP measure at beginning of session and with activity. Work on turning strategies (figure-8, wide turns, smaller radius turns and smooth changes of direction).  Single limb stance, functional strength, hip stability work, towards [place]! supine, standing, sitting and hands    Consulted and Agree with Plan of Care Patient;Family member/caregiver    Family Member Consulted husband             Patient will benefit from skilled therapeutic intervention in order to improve the following deficits and impairments:  Abnormal gait, Difficulty walking, Decreased balance, Decreased mobility, Decreased strength, Postural dysfunction  Visit Diagnosis: Other abnormalities of gait and mobility  Unsteadiness on feet  Other symptoms and signs involving the nervous system     Problem List Patient Active Problem List   Diagnosis Date Noted   Parkinson's disease ([place]) 04/03/2021   Acute midline low back pain without sciatica 06/17/2016   High risk medication use 03/02/2016   DDD (degenerative disc disease), lumbar 03/02/2016   Osteoarthritis, hand 03/02/2016   Osteoarthritis, knee 03/02/2016   Acute bronchitis 07/17/2012   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 [place], [place], [address] Phone: [phone]   Fax:  [phone]  Name: Sally Diaz MRN: [id number] Date of Birth: Jul 21, 1945

## 2021-05-08 ENCOUNTER — Telehealth: Payer: Self-pay | Admitting: Neurology

## 2021-05-08 NOTE — Telephone Encounter (Signed)
Patient is confused about her medicine. She said some of her meds seem to be changed, and she needs to know if it alright to take them like that.

## 2021-05-08 NOTE — Telephone Encounter (Signed)
Called patient she had a question about a medication on her My chart listed under dr. Dora Sims. I answered her questions and she is contacting that office

## 2021-05-11 ENCOUNTER — Ambulatory Visit: Payer: Medicare PPO | Admitting: Physical Therapy

## 2021-05-11 ENCOUNTER — Other Ambulatory Visit: Payer: Self-pay

## 2021-05-11 ENCOUNTER — Encounter: Payer: Self-pay | Admitting: Physical Therapy

## 2021-05-11 VITALS — BP 150/77 | HR 74

## 2021-05-11 DIAGNOSIS — R2681 Unsteadiness on feet: Secondary | ICD-10-CM

## 2021-05-11 DIAGNOSIS — R293 Abnormal posture: Secondary | ICD-10-CM | POA: Diagnosis not present

## 2021-05-11 DIAGNOSIS — R29818 Other symptoms and signs involving the nervous system: Secondary | ICD-10-CM

## 2021-05-11 DIAGNOSIS — R2689 Other abnormalities of gait and mobility: Secondary | ICD-10-CM

## 2021-05-11 NOTE — Therapy (Signed)
[facility] [place] [place], [place], [place], [address] Phone: [phone]   Fax:  [phone]  Physical Therapy Treatment  Patient Details  Name: Sally Diaz MRN: [id number] Date of Birth: 04-Nov-1945 Referring Provider (PT): Alonza Bogus, DO   Encounter Date: 05/11/2021   PT End of Session - 05/11/21 1104     Visit Number 9    Number of Visits 13    Date for PT Re-Evaluation 05/22/21    Authorization Type [employer]    Authorization Time Period 04/08/2021-05/22/2021    Authorization - Visit Number 9    Authorization - Number of Visits 12    Progress Note Due on Visit 10    PT Start Time 1018    PT Stop Time 1059    PT Time Calculation (min) 41 min    Equipment Utilized During Treatment Gait belt    Activity Tolerance Patient tolerated treatment well   elevated BP, but no symptoms   Behavior During Therapy WFL for tasks assessed/performed             Past Medical History:  Diagnosis Date   Allergy, unspecified not elsewhere classified    Anemia, iron deficiency    Arthritis    Asthma    Basal cell carcinoma    Diverticulosis of colon    GERD (gastroesophageal reflux disease)    Hx of adenomatous colonic polyps    Hypercholesterolemia    Hypertension    Hypothyroidism    Obesity    Osteoporosis    Reflux esophagitis    Rheumatoid arthritis ([place])     Past Surgical History:  Procedure Laterality Date   CATARACT EXTRACTION, BILATERAL     CERVICAL CONE BIOPSY     COLONOSCOPY     ENDOMETRIAL ABLATION     TONSILLECTOMY     UPPER GASTROINTESTINAL ENDOSCOPY      Vitals:   05/11/21 1021  BP: (!) 150/77  Pulse: 74  SpO2: 94%     Subjective Assessment - 05/11/21 1019     Subjective Denies recent falls. BP has been fine- was normal today.    Pertinent History MH:  hypothyroidism, hypercholesterolemia, RA, DDD-lumbar spine, OA    Patient Stated Goals Whatever you can do to help me move well.     Currently in Pain? No/denies                               [facility] Adult PT Treatment/Exercise - 05/11/21 0001       Neuro Re-ed    Neuro Re-ed Details  gait training + R/L turn with practice avoiding pivot and instead lifting feet off floor, figure 8 turns      Knee/Hip Exercises: Standing   Other Standing Knee Exercises alt step up in 30"  3x with CGA   cues for foot placement and maintain alternating pattern   Other Standing Knee Exercises sidestepping with red medball at chest 4x 81ft   cues to maintain step length     Knee/Hip Exercises: Seated   Other Seated Knee/Hip Exercises R/L hip abduction and hip extension with red loop 2x10   good amplitude, cues to maintain upright chest   Sit to Sand 10 reps;without UE support;2 sets   + red medball OH reach                    PT Education - 05/11/21 1104  Education Details advised patient to speak to her PCP about questions on her medications    Person(s) Educated Patient    Methods Explanation    Comprehension Verbalized understanding              PT Short Term Goals - 05/06/21 1315       PT SHORT TERM GOAL #1   Title Pt will be independent with HEP for improved strength, balance, transfers, and gait.  TARGET 05/08/2021    Time 4    Period Weeks    Status Achieved               PT Long Term Goals - 05/06/21 1411       PT LONG TERM GOAL #1   Title Pt will be independent with HEP for improved strength, balance, transfers, and gait.  TARGET 05/22/2021    Time 6    Period Weeks    Status On-going      PT LONG TERM GOAL #2   Title Pt will improve MiniBESTest score to at least 23/28 to decrease fall risk.    Baseline 19/28    Time 6    Period Weeks    Status On-going      PT LONG TERM GOAL #3   Title Pt will ambulate at least 1000 ft outdoor surfaces, with appropriate assistive device, no LOB, mod I for improved outdoor gait negotiation.    Time 6    Period Weeks    Status  On-going      PT LONG TERM GOAL #4   Title Pt will verbalize understanding of local Parkinson's disease resources, including fitness options post-d/c.    Time 6    Period Weeks    Status On-going                   Plan - 05/11/21 1104     Clinical Impression Statement Patient arrived to session with report of no new changes. Systolic BP slightly elevated at start if session but patient asymptomatic. Proceeded with session while monitoring symptoms. Worked on dynamic balance activities with CGA for safety. Patient intermittently required cues to maintain step length and cues for safe foot placement. Progressive hip strengthening exercises revealed good use of high amplitude movements; intermittent cueing for posture required. Worked on gait with turns with cueing to avoid pivot turn to avoid imbalance, instead to increase step height for more continuous turn. Patient tolerated session well and without complaints at end of session.    Personal Factors and Comorbidities Comorbidity 3+    Comorbidities PMH:  hypothyroidism, hypercholesterolemia, RA, DDD-lumbar spine, OA    Examination-Activity Limitations Locomotion Level;Transfers;Stand    Examination-Participation Restrictions Community Activity    Stability/Clinical Decision Making Stable/Uncomplicated    Rehab Potential Good    PT Frequency 2x / week    PT Duration 6 weeks   plus eval   PT Treatment/Interventions ADLs/Self Care Home Management;Gait training;Functional mobility training;Therapeutic activities;Therapeutic exercise;Balance training;Neuromuscular re-education;Patient/family education;Manual techniques;Passive range of motion;Aquatic Therapy    PT Next Visit Plan Check BP measure at beginning of session and with activity. Work on turning strategies (figure-8, wide turns, smaller radius turns and smooth changes of direction).  Single limb stance, functional strength, hip stability work, towards [place]! supine, standing, sitting and hands    Consulted and Agree with Plan of Care Patient;Family member/caregiver    Family Member Consulted husband  Patient will benefit from skilled therapeutic intervention in order to improve the following deficits and impairments:  Abnormal gait, Difficulty walking, Decreased balance, Decreased mobility, Decreased strength, Postural dysfunction  Visit Diagnosis: Other abnormalities of gait and mobility  Unsteadiness on feet  Other symptoms and signs involving the nervous system  Abnormal posture     Problem List Patient Active Problem List   Diagnosis Date Noted   Parkinson's disease ([place]) 04/03/2021   Acute midline low back pain without sciatica 06/17/2016   High risk medication use 03/02/2016   DDD (degenerative disc disease), lumbar 03/02/2016   Osteoarthritis, hand 03/02/2016   Osteoarthritis, knee 03/02/2016   Acute bronchitis 07/17/2012   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 [place], [place], [address] Phone: [phone]   Fax:  [phone]  Name: ANNALIE WENNER MRN: [id number] Date of Birth: 06/03/1945

## 2021-05-13 ENCOUNTER — Ambulatory Visit: Payer: Medicare PPO | Admitting: Physical Therapy

## 2021-05-13 ENCOUNTER — Encounter: Payer: Self-pay | Admitting: Physical Therapy

## 2021-05-13 ENCOUNTER — Other Ambulatory Visit: Payer: Self-pay

## 2021-05-13 DIAGNOSIS — R2689 Other abnormalities of gait and mobility: Secondary | ICD-10-CM

## 2021-05-13 DIAGNOSIS — R2681 Unsteadiness on feet: Secondary | ICD-10-CM

## 2021-05-13 DIAGNOSIS — R293 Abnormal posture: Secondary | ICD-10-CM | POA: Diagnosis not present

## 2021-05-13 NOTE — Patient Instructions (Signed)
Access Code: [email or web address] URL: [email or web address] Date: 05/13/2021 Prepared by: [facility]  Exercises Heel Toe Raises with Counter Support - 1 x daily - 5 x weekly - 2 sets - 10 reps Standing Balance in Corner - 1 x daily - 5 x weekly - 1-2 sets - 10 reps Standing Balance in Corner with Eyes Closed - 1 x daily - 5 x weekly - 1-2 sets - 10 reps Side Stepping with Resistance at Thighs and Counter Support - 1 x daily - 5 x weekly - 1 sets - 5 reps

## 2021-05-13 NOTE — Therapy (Signed)
[facility] [place], [place], [place], [address] Phone: [phone]   Fax:  [phone]  Physical Therapy Treatment/10th Visit Progress Note  Patient Details  Name: Sally Diaz MRN: [id number] Date of Birth: 07-01-1945 Referring Provider (PT): Alonza Bogus, DO   Encounter Date: 05/13/2021   PT End of Session - 05/13/21 1316     Visit Number 10    Number of Visits 13    Date for PT Re-Evaluation 05/22/21    Authorization Type [employer]    Authorization Time Period 04/08/2021-05/22/2021    Authorization - Visit Number 10    Authorization - Number of Visits 12    Progress Note Due on Visit 10    PT Start Time 1316    PT Stop Time 1401    PT Time Calculation (min) 45 min    Equipment Utilized During Treatment Gait belt    Activity Tolerance Patient tolerated treatment well   elevated BP, but no symptoms   Behavior During Therapy WFL for tasks assessed/performed             Past Medical History:  Diagnosis Date   Allergy, unspecified not elsewhere classified    Anemia, iron deficiency    Arthritis    Asthma    Basal cell carcinoma    Diverticulosis of colon    GERD (gastroesophageal reflux disease)    Hx of adenomatous colonic polyps    Hypercholesterolemia    Hypertension    Hypothyroidism    Obesity    Osteoporosis    Reflux esophagitis    Rheumatoid arthritis ([place])     Past Surgical History:  Procedure Laterality Date   CATARACT EXTRACTION, BILATERAL     CERVICAL CONE BIOPSY     COLONOSCOPY     ENDOMETRIAL ABLATION     TONSILLECTOMY     UPPER GASTROINTESTINAL ENDOSCOPY      There were no vitals filed for this visit.   Subjective Assessment - 05/13/21 1316     Subjective No changes, nothing new.  Feel like I've improved some-maybe balancing a little better.  Taking more time to think before moving.    Pertinent History MH:  hypothyroidism, hypercholesterolemia, RA, DDD-lumbar spine, OA     Patient Stated Goals Whatever you can do to help me move well.    Currently in Pain? No/denies                               [facility] Adult PT Treatment/Exercise - 05/13/21 0001       Standardized Balance Assessment   Standardized Balance Assessment Timed Up and Go Test      Timed Up and Go Test   TUG Normal TUG;Cognitive TUG    Normal TUG (seconds) 11    Cognitive TUG (seconds) 13.07      Mini-BESTest   Sit To Stand Normal: Comes to stand without use of hands and stabilizes independently.    Rise to Toes < 3 s.   2.62   Stand on one leg (left) Moderate: < 20 s   5.66, 6.9   Stand on one leg (right) Moderate: < 20 s   6.94, 5   Stand on one leg - lowest score 1    Compensatory Stepping Correction - Forward Normal: Recovers independently with a single, large step (second realignement is allowed).    Compensatory Stepping Correction - Backward Normal: Recovers independently  with a single, large step    Compensatory Stepping Correction - Left Lateral Normal: Recovers independently with 1 step (crossover or lateral OK)    Compensatory Stepping Correction - Right Lateral Normal: Recovers independently with 1 step (crossover or lateral OK)    Stepping Corredtion Lateral - lowest score 2    Stance - Feet together, eyes open, firm surface  Normal: 30s    Stance - Feet together, eyes closed, foam surface  Normal: 30s    Incline - Eyes Closed Normal: Stands independently 30s and aligns with gravity    Change in Gait Speed Normal: Significantly changes walkling speed without imbalance    Walk with head turns - Horizontal Moderate: performs head turns with reduction in gait speed.    Walk with pivot turns Normal: Turns with feet close FAST (< 3 steps) with good balance.   2.12   Step over obstacles Moderate: Steps over box but touches box OR displays cautious behavior by slowing gait.    Timed UP & GO with Dual Task Moderate: Dual Task affects either counting OR walking (>10%)  when compared to the TUG without Dual Task.    Mini-BEST total score 22                 Balance Exercises - 05/13/21 0001       Balance Exercises: Standing   Standing Eyes Opened Wide (BOA);Narrow base of support (BOS);Foam/compliant surface;Limitations    Standing Eyes Opened Limitations Head turns/nods x 5    Standing Eyes Closed Wide (BOA);Narrow base of support (BOS);Foam/compliant surface;1 rep;Other reps (comment)   15 seconds   Sidestepping Upper extremity support;3 reps;Limitations;Theraband    Theraband Level (Sidestepping) Level 2 (Red)    Sidestepping Limitations 3 reps R and L in parallel bars with no resistance, then 3 laps with resistance, cues for control and posture.    Heel Raises Both;10 reps;Limitations    Heel Raises Limitations 2 sets    Toe Raise Both;10 reps;Limitations    Toe Raise Limitations 2 sets    Other Standing Exercises Stagger stance forward/back rocking x 10 reps, 2 sets                PT Education - 05/13/21 1401     Education Details Additions to HEP for balance-see instructions    Person(s) Educated Patient    Methods Explanation;Demonstration;Handout    Comprehension Verbalized understanding              PT Short Term Goals - 05/06/21 1315       PT SHORT TERM GOAL #1   Title Pt will be independent with HEP for improved strength, balance, transfers, and gait.  TARGET 05/08/2021    Time 4    Period Weeks    Status Achieved               PT Long Term Goals - 05/06/21 1411       PT LONG TERM GOAL #1   Title Pt will be independent with HEP for improved strength, balance, transfers, and gait.  TARGET 05/22/2021    Time 6    Period Weeks    Status On-going      PT LONG TERM GOAL #2   Title Pt will improve MiniBESTest score to at least 23/28 to decrease fall risk.    Baseline 19/28    Time 6    Period Weeks    Status On-going      PT LONG TERM GOAL #3  Title Pt will ambulate at least 1000 ft outdoor  surfaces, with appropriate assistive device, no LOB, mod I for improved outdoor gait negotiation.    Time 6    Period Weeks    Status On-going      PT LONG TERM GOAL #4   Title Pt will verbalize understanding of local Parkinson's disease resources, including fitness options post-d/c.    Time 6    Period Weeks    Status On-going                   Plan - 05/13/21 1541     Clinical Impression Statement 10th Visit Progress note, covering dates from 04/08/2021-05/13/2021.  Pt subjectively reports she feels better and is more aware of her movement patterns.  Obejctively, pt has improved on MiniBESTest, with score improved from 19/28 to 22/28.  Pt demonstrates improved balance, but she does report balance is her biggest concern still.  Addressed balance with additional exercises this visit added to HEP.  Pt has met her STG, and she is progressing towards LTGs.  She will continue to benefit from skilled PT towards improved balance and fucntional mobility.    Personal Factors and Comorbidities Comorbidity 3+    Comorbidities PMH:  hypothyroidism, hypercholesterolemia, RA, DDD-lumbar spine, OA    Examination-Activity Limitations Locomotion Level;Transfers;Stand    Examination-Participation Restrictions Community Activity    Stability/Clinical Decision Making Stable/Uncomplicated    Rehab Potential Good    PT Frequency 2x / week    PT Duration 6 weeks   plus eval   PT Treatment/Interventions ADLs/Self Care Home Management;Gait training;Functional mobility training;Therapeutic activities;Therapeutic exercise;Balance training;Neuromuscular re-education;Patient/family education;Manual techniques;Passive range of motion;Aquatic Therapy    PT Next Visit Plan Check updates to HEP; Next week is last in [place] and pt feels she may be ready for d/c at that time.  Assess LTGs and review HEP; review community fitness and plan for return screen vs. eval.    PT Home Exercise Plan PWR! supine, standing, sitting  and hands    Consulted and Agree with Plan of Care Patient    Family Member Consulted --             Patient will benefit from skilled therapeutic intervention in order to improve the following deficits and impairments:  Abnormal gait, Difficulty walking, Decreased balance, Decreased mobility, Decreased strength, Postural dysfunction  Visit Diagnosis: Other abnormalities of gait and mobility  Unsteadiness on feet     Problem List Patient Active Problem List   Diagnosis Date Noted   Parkinson's disease ([place]) 04/03/2021   Acute midline low back pain without sciatica 06/17/2016   High risk medication use 03/02/2016   DDD (degenerative disc disease), lumbar 03/02/2016   Osteoarthritis, hand 03/02/2016   Osteoarthritis, knee 03/02/2016   Acute bronchitis 07/17/2012   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, [place], [place], [address] Phone: [phone]   Fax:  [phone]  Name: ETHELYN CERNIGLIA MRN: [id number] Date of Birth: June 01, 1945

## 2021-05-18 ENCOUNTER — Encounter: Payer: Self-pay | Admitting: Physical Therapy

## 2021-05-18 ENCOUNTER — Other Ambulatory Visit: Payer: Self-pay

## 2021-05-18 ENCOUNTER — Ambulatory Visit: Payer: Medicare PPO | Admitting: Physical Therapy

## 2021-05-18 DIAGNOSIS — R293 Abnormal posture: Secondary | ICD-10-CM | POA: Diagnosis not present

## 2021-05-18 DIAGNOSIS — R2681 Unsteadiness on feet: Secondary | ICD-10-CM

## 2021-05-18 DIAGNOSIS — R2689 Other abnormalities of gait and mobility: Secondary | ICD-10-CM

## 2021-05-18 NOTE — Therapy (Signed)
[facility] [place], [place], [place], [address] Phone: [phone]   Fax:  [phone]  Physical Therapy Treatment  Patient Details  Name: Sally Diaz MRN: [id number] Date of Birth: [birth year] Referring Provider (PT): Alonza Bogus, DO   Encounter Date: 05/18/2021   PT End of Session - 05/18/21 1450     Visit Number 11    Number of Visits 13    Date for PT Re-Evaluation 05/22/21    Authorization Type [employer]    Authorization Time Period 04/08/2021-05/22/2021    Authorization - Visit Number 10    Authorization - Number of Visits 12    Progress Note Due on Visit 10    PT Start Time 1450    PT Stop Time 1528    PT Time Calculation (min) 38 min    Equipment Utilized During Treatment Gait belt    Activity Tolerance Patient tolerated treatment well    Behavior During Therapy WFL for tasks assessed/performed             Past Medical History:  Diagnosis Date   Allergy, unspecified not elsewhere classified    Anemia, iron deficiency    Arthritis    Asthma    Basal cell carcinoma    Diverticulosis of colon    GERD (gastroesophageal reflux disease)    Hx of adenomatous colonic polyps    Hypercholesterolemia    Hypertension    Hypothyroidism    Obesity    Osteoporosis    Reflux esophagitis    Rheumatoid arthritis ([place])     Past Surgical History:  Procedure Laterality Date   CATARACT EXTRACTION, BILATERAL     CERVICAL CONE BIOPSY     COLONOSCOPY     ENDOMETRIAL ABLATION     TONSILLECTOMY     UPPER GASTROINTESTINAL ENDOSCOPY      There were no vitals filed for this visit.   Subjective Assessment - 05/18/21 1451     Subjective No pain from the class except a little pain in the shoulder from the posture exercises.  Overall, feel more confident about walking and exercise in regards to Parkinson's.    Pertinent History MH:  hypothyroidism, hypercholesterolemia, RA, DDD-lumbar spine, OA    Patient  Stated Goals Whatever you can do to help me move well.    Currently in Pain? No/denies                               [facility] Adult PT Treatment/Exercise - 05/18/21 0001       Ambulation/Gait   Ambulation/Gait Yes    Ambulation/Gait Assistance 5: Supervision;6: Modified independent (Device/Increase time)    Ambulation Distance (Feet) 1000 Feet    Assistive device None    Gait Pattern Lateral hip instability;Step-through pattern;Poor foot clearance - right    Ambulation Surface Level;Indoor;Unlevel;Outdoor    Gait Comments Occasional cues for foot clearance      Self-Care   Self-Care Other Self-Care Comments    Other Self-Care Comments  Discussed return PT eval in 6 months after discharge.  Discussed benefits of OT and speech therapy and pt agrees to schedule screens.  Discussed optimal exercise program upon d/c  and importance of continued exercise.               Access Code: [email or web address] URL: [email or web address] Date: 05/18/2021 Prepared by: [facility]  Exercises-Reviewed additions to exercises  last visit and pt return demo understanding.  Provided new theraband (red) for thighs, as her red band at home is too tight.  Discussed progression to the tighter red band at ankles over time with resisted sidestepping.  Heel Toe Raises with Counter Support - 1 x daily - 5 x weekly - 2 sets - 10 reps Standing Balance in Corner - 1 x daily - 5 x weekly - 1-2 sets - 10 reps Standing Balance in Corner with Eyes Closed - 1 x daily - 5 x weekly - 1 sets - 3 reps - 15 sec hold Side Stepping with Resistance at Thighs and Counter Support - 1 x daily - 5 x weekly - 1 sets - 5 reps      PT Education - 05/18/21 1507     Education Details Optimal PD fitness program upon d/c from PT.    Person(s) Educated Patient    Methods Explanation;Handout    Comprehension Verbalized understanding              PT Short Term Goals  - 05/06/21 1315       PT SHORT TERM GOAL #1   Title Pt will be independent with HEP for improved strength, balance, transfers, and gait.  TARGET 05/08/2021    Time 4    Period Weeks    Status Achieved               PT Long Term Goals - 05/18/21 1537       PT LONG TERM GOAL #1   Title Pt will be independent with HEP for improved strength, balance, transfers, and gait.  TARGET 05/22/2021    Baseline met per review 05/18/2021    Time 6    Period Weeks    Status Achieved      PT LONG TERM GOAL #2   Title Pt will improve MiniBESTest score to at least 23/28 to decrease fall risk.    Baseline 19/28; 22/28 05/13/21    Time 6    Period Weeks    Status On-going      PT LONG TERM GOAL #3   Title Pt will ambulate at least 1000 ft outdoor surfaces, with appropriate assistive device, no LOB, mod I for improved outdoor gait negotiation.    Baseline supervision 1000 ft, no device    Time 6    Period Weeks    Status Partially Met      PT LONG TERM GOAL #4   Title Pt will verbalize understanding of local Parkinson's disease resources, including fitness options post-d/c.    Time 6    Period Weeks    Status On-going                   Plan - 05/18/21 1537     Clinical Impression Statement Began assessing LTGs this visit, with pt meeting LTG 1 for independence in HEP, particularly new HEP given last visit.  Educated pt in optimal PD fitness to include her PWR! Moves (sit, stand daily) and other HEP given last visit + walking + aerobic activity.  LTG 3 partially met, with pt ambulating 1000 ft, no device, supervision.  She has single walking pole and educated pt to use that if needed for long distance gait.  Pt on target towards remaining LTGs and pt is agreeable to discharge next visit.    Personal Factors and Comorbidities Comorbidity 3+    Comorbidities PMH:  hypothyroidism, hypercholesterolemia, RA, DDD-lumbar spine, OA  Examination-Activity Limitations Locomotion  Level;Transfers;Stand    Examination-Participation Restrictions Community Activity    Stability/Clinical Decision Making Stable/Uncomplicated    Rehab Potential Good    PT Frequency 2x / week    PT Duration 6 weeks   plus eval   PT Treatment/Interventions ADLs/Self Care Home Management;Gait training;Functional mobility training;Therapeutic activities;Therapeutic exercise;Balance training;Neuromuscular re-education;Patient/family education;Manual techniques;Passive range of motion;Aquatic Therapy    PT Next Visit Plan Check MiniBESTest, TUG/TUG cognitive, 5x sit<>stand and gait velocity; review community/online resources.  Plan for d/c next visit.  Discussed today and pt scheduled return PT eval and OT/speech screens.    PT Home Exercise Plan PWR! supine, standing, sitting and hands    Consulted and Agree with Plan of Care Patient             Patient will benefit from skilled therapeutic intervention in order to improve the following deficits and impairments:  Abnormal gait, Difficulty walking, Decreased balance, Decreased mobility, Decreased strength, Postural dysfunction  Visit Diagnosis: Other abnormalities of gait and mobility  Unsteadiness on feet     Problem List Patient Active Problem List   Diagnosis Date Noted   Parkinson's disease ([place]) 04/03/2021   Acute midline low back pain without sciatica 06/17/2016   High risk medication use 03/02/2016   DDD (degenerative disc disease), lumbar 03/02/2016   Osteoarthritis, hand 03/02/2016   Osteoarthritis, knee 03/02/2016   Acute bronchitis 07/17/2012   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, [place], [place], [address] Phone: [phone]   Fax:  [phone]  Name: Sally Diaz MRN: [id number] Date of Birth: [birth year]

## 2021-05-18 NOTE — Patient Instructions (Signed)
Optimal Fitness Program after Therapy for People with Parkinson's Disease  1)  Therapy Home Exercise Program  -Do these Exercises DAILY as instructed by your therapist  -Big, deliberate effort with exercises  -These exercises are important to perform consistently, even when therapist has  finished, because these therapy exercises often address your specific  Parkinson's difficulties   2)  Walking  -  Work up to walking 3-5 times per week, 20-30 minutes per day  -This can be done at home, driveway, quiet street or an indoor track  -Focus should be on your Best posture, arm swing, step length for your best walking pattern  3)  Aerobic Exercise  -Work up to 3-5 times per week, 30 minutes per day  -This can be stationary bike, seated stepper machine, elliptical machine  -Work up to 7-8/10 intensity during the exercise, at minimal to moderate     Resistance

## 2021-05-20 ENCOUNTER — Encounter: Payer: Self-pay | Admitting: Physical Therapy

## 2021-05-20 ENCOUNTER — Ambulatory Visit: Payer: Medicare PPO | Attending: Neurology | Admitting: Physical Therapy

## 2021-05-20 ENCOUNTER — Other Ambulatory Visit: Payer: Self-pay

## 2021-05-20 DIAGNOSIS — R293 Abnormal posture: Secondary | ICD-10-CM | POA: Insufficient documentation

## 2021-05-20 DIAGNOSIS — R2681 Unsteadiness on feet: Secondary | ICD-10-CM | POA: Diagnosis present

## 2021-05-20 DIAGNOSIS — R2689 Other abnormalities of gait and mobility: Secondary | ICD-10-CM | POA: Diagnosis not present

## 2021-05-20 DIAGNOSIS — R29818 Other symptoms and signs involving the nervous system: Secondary | ICD-10-CM | POA: Insufficient documentation

## 2021-05-20 NOTE — Therapy (Signed)
[facility] [place], [place], [place], [address] Phone: [phone]   Fax:  [phone]  Physical Therapy Treatment  Patient Details  Name: Sally Diaz MRN: [id number] Date of Birth: May 14, 1945 Referring Provider (PT): Alonza Bogus, DO   Encounter Date: 05/20/2021   PT End of Session - 05/20/21 1125     Visit Number 12    Number of Visits 13    Date for PT Re-Evaluation 05/22/21    Authorization Type [employer]    Authorization Time Period 04/08/2021-05/22/2021    Authorization - Visit Number 10    Authorization - Number of Visits 12    Progress Note Due on Visit 10    PT Start Time 0933    PT Stop Time 1017    PT Time Calculation (min) 44 min    Activity Tolerance Patient tolerated treatment well    Behavior During Therapy WFL for tasks assessed/performed             Past Medical History:  Diagnosis Date   Allergy, unspecified not elsewhere classified    Anemia, iron deficiency    Arthritis    Asthma    Basal cell carcinoma    Diverticulosis of colon    GERD (gastroesophageal reflux disease)    Hx of adenomatous colonic polyps    Hypercholesterolemia    Hypertension    Hypothyroidism    Obesity    Osteoporosis    Reflux esophagitis    Rheumatoid arthritis ([place])     Past Surgical History:  Procedure Laterality Date   CATARACT EXTRACTION, BILATERAL     CERVICAL CONE BIOPSY     COLONOSCOPY     ENDOMETRIAL ABLATION     TONSILLECTOMY     UPPER GASTROINTESTINAL ENDOSCOPY      There were no vitals filed for this visit.   Subjective Assessment - 05/20/21 0936     Subjective Denies any falls or changes since last visit.  Was able to get on the floor in class without hurting her back and did not have any knee pain either.    Pertinent History MH:  hypothyroidism, hypercholesterolemia, RA, DDD-lumbar spine, OA    Patient Stated Goals Whatever you can do to help me move well.    Currently in Pain?  No/denies                   [facility] Adult PT Treatment/Exercise - 05/20/21 0001       Transfers   Transfers Sit to Stand;Stand to Sit    Sit to Stand 6: Modified independent (Device/Increase time)    Five time sit to stand comments  7.10    Stand to Sit 6: Modified independent (Device/Increase time)      Ambulation/Gait   Gait velocity 7.10 sec = 4.62 ft/sec      Timed Up and Go Test   TUG Normal TUG;Cognitive TUG    Normal TUG (seconds) 8.34    Cognitive TUG (seconds) 8.44   looses count     Mini-BESTest   Sit To Stand Normal: Comes to stand without use of hands and stabilizes independently.    Rise to Toes Moderate: Heels up, but not full range (smaller than when holding hands), OR noticeable instability for 3 s.    Stand on one leg (left) Moderate: < 20 s   12.84   Stand on one leg (right) Moderate: < 20 s   10.85   Stand on one  leg - lowest score 1    Compensatory Stepping Correction - Forward Normal: Recovers independently with a single, large step (second realignement is allowed).    Compensatory Stepping Correction - Backward Normal: Recovers independently with a single, large step    Compensatory Stepping Correction - Left Lateral Normal: Recovers independently with 1 step (crossover or lateral OK)    Compensatory Stepping Correction - Right Lateral Normal: Recovers independently with 1 step (crossover or lateral OK)    Stepping Corredtion Lateral - lowest score 2    Stance - Feet together, eyes open, firm surface  Normal: 30s    Stance - Feet together, eyes closed, foam surface  Normal: 30s    Incline - Eyes Closed Normal: Stands independently 30s and aligns with gravity    Change in Gait Speed Normal: Significantly changes walkling speed without imbalance    Walk with head turns - Horizontal Normal: performs head turns with no change in gait speed and good balance    Walk with pivot turns Normal: Turns with feet close FAST (< 3 steps) with good balance.    Step  over obstacles Normal: Able to step over box with minimal change of gait speed and with good balance.    Timed UP & GO with Dual Task Moderate: Dual Task affects either counting OR walking (>10%) when compared to the TUG without Dual Task.    Mini-BEST total score 25      Exercises   Exercises Knee/Hip      Knee/Hip Exercises: Aerobic   Nustep Workload 5 x 8 minutes with spm>74 all 4 extremities.  Rates effort level 5/10 at spm of 75 and increases to 10/10 with 30 sec bout of increased to 120 spm                     PT Education - 05/20/21 1123     Education Details Community PD resources, PT eval in 6 months, reviewed optimal community fitness, progress toward goals    Person(s) Educated Patient    Methods Explanation    Comprehension Verbalized understanding              PT Short Term Goals - 05/06/21 1315       PT SHORT TERM GOAL #1   Title Pt will be independent with HEP for improved strength, balance, transfers, and gait.  TARGET 05/08/2021    Time 4    Period Weeks    Status Achieved               PT Long Term Goals - 05/20/21 1124       PT LONG TERM GOAL #1   Title Pt will be independent with HEP for improved strength, balance, transfers, and gait.  TARGET 05/22/2021    Baseline met per review 05/18/2021    Time 6    Period Weeks    Status Achieved      PT LONG TERM GOAL #2   Title Pt will improve MiniBESTest score to at least 23/28 to decrease fall risk.    Baseline 25/28 on 05/20/21    Time 6    Period Weeks    Status Achieved      PT LONG TERM GOAL #3   Title Pt will ambulate at least 1000 ft outdoor surfaces, with appropriate assistive device, no LOB, mod I for improved outdoor gait negotiation.    Baseline supervision 1000 ft, no device    Time 6    Period  Weeks    Status Partially Met      PT LONG TERM GOAL #4   Title Pt will verbalize understanding of local Parkinson's disease resources, including fitness options post-d/c.    Time  6    Period Weeks    Status Achieved                   Plan - 05/20/21 1125     Clinical Impression Statement Finished assessing LTG's.  Pt met LTG 2 and 4 today.  Pt agreeable to d/c per poc and per Mady Haagensen, PT.    Personal Factors and Comorbidities Comorbidity 3+    Comorbidities PMH:  hypothyroidism, hypercholesterolemia, RA, DDD-lumbar spine, OA    Examination-Activity Limitations Locomotion Level;Transfers;Stand    Examination-Participation Restrictions Community Activity    Stability/Clinical Decision Making Stable/Uncomplicated    Rehab Potential Good    PT Frequency 2x / week    PT Duration 6 weeks   plus eval   PT Treatment/Interventions ADLs/Self Care Home Management;Gait training;Functional mobility training;Therapeutic activities;Therapeutic exercise;Balance training;Neuromuscular re-education;Patient/family education;Manual techniques;Passive range of motion;Aquatic Therapy    PT Next Visit Plan Discharge from PT    PT [place]! supine, standing, sitting and hands    Consulted and Agree with Plan of Care Patient             Patient will benefit from skilled therapeutic intervention in order to improve the following deficits and impairments:  Abnormal gait, Difficulty walking, Decreased balance, Decreased mobility, Decreased strength, Postural dysfunction  Visit Diagnosis: Other abnormalities of gait and mobility  Unsteadiness on feet  Other symptoms and signs involving the nervous system  Abnormal posture     Problem List Patient Active Problem List   Diagnosis Date Noted   Parkinson's disease ([place]) 04/03/2021   Acute midline low back pain without sciatica 06/17/2016   High risk medication use 03/02/2016   DDD (degenerative disc disease), lumbar 03/02/2016   Osteoarthritis, hand 03/02/2016   Osteoarthritis, knee 03/02/2016   Acute bronchitis 07/17/2012   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

## 2021-05-22 ENCOUNTER — Ambulatory Visit: Payer: Medicare PPO | Admitting: Physical Therapy

## 2021-06-30 ENCOUNTER — Encounter: Payer: Self-pay | Admitting: Gastroenterology

## 2021-07-07 ENCOUNTER — Encounter: Payer: Self-pay | Admitting: Physical Therapy

## 2021-07-07 NOTE — Therapy (Signed)
[place] ?[facility] ?[address], [address] ?[place], [place], [address] ?Phone: [phone]   Fax:  [phone] ? ?Patient Details  ?Name: Sally Diaz ?MRN: [id number] ?Date of Birth: 06/08/1945 ?Referring Provider:  No ref. provider found ? ?Encounter Date: 07/07/2021 ? ?PHYSICAL THERAPY DISCHARGE SUMMARY ? ?Visits from Start of Care: 12 ? ?Current functional level related to goals / functional outcomes: ? PT Long Term Goals - 05/20/21 1124   ? ?  ? PT LONG TERM GOAL #1  ? Title Pt will be independent with HEP for improved strength, balance, transfers, and gait.  TARGET 05/22/2021   ? Baseline met per review 05/18/2021   ? Time 6   ? Period Weeks   ? Status Achieved   ?  ? PT LONG TERM GOAL #2  ? Title Pt will improve MiniBESTest score to at least 23/28 to decrease fall risk.   ? Baseline 25/28 on 05/20/21   ? Time 6   ? Period Weeks   ? Status Achieved   ?  ? PT LONG TERM GOAL #3  ? Title Pt will ambulate at least 1000 ft outdoor surfaces, with appropriate assistive device, no LOB, mod I for improved outdoor gait negotiation.   ? Baseline supervision 1000 ft, no device   ? Time 6   ? Period Weeks   ? Status Partially Met   ?  ? PT LONG TERM GOAL #4  ? Title Pt will verbalize understanding of local Parkinson's disease resources, including fitness options post-d/c.   ? Time 6   ? Period Weeks   ? Status Achieved   ? ?  ?  ? ?  ? ?Pt has met 3 of 4 LTGs. ?  ?Remaining deficits: ?Balance, gait-improved during course of therapy ?  ?Education / Equipment: ?HEP, local Parkinson's disease resources  ? ?Patient agrees to discharge. Patient goals were met. Patient is being discharged due to meeting the stated rehab goals.  Plan for return eval in 6-9 months.   ? ? ?, W., PT ?07/07/2021, 2:57 PM ? ?[place] ?[facility] ?[address], [address] ?[place], [place], [address] ?Phone: [phone]   Fax:  [phone] ?

## 2021-09-02 NOTE — Progress Notes (Signed)
Assessment/Plan:   1.  ET/PD  -DaTscan correlates with symptoms.  DaTscan demonstrated decreased activity in the left putamen.  -Patient's father diagnosed with Parkinson's disease in his later life as well.  Discussed Parkinsons Disease GENEration study via PF  -discussed exercise  -Continue carbidopa/levodopa 25/100, 1 tablet 3 times per day.  -We discussed that it used to be thought that levodopa would increase risk of melanoma but now it is believed that Parkinsons itself likely increases risk of melanoma. she is to get regular skin checks.    Subjective:   Sally Diaz was seen today in follow up for ET/PD. This patient is accompanied in the office by her spouse who supplements the history.  Last visit, levodopa was started.  She called me not long thereafter (doing titration studies) and stated that her blood pressure was high and wondered if it was from the levodopa.  We told her that that would most certainly not cause high blood pressure and she would need to follow-up with her primary care physician.  Physical therapy notes from last visit are reviewed.  She is doing PWR UP exercises daily.  She is working in her garden    ALLERGIES:   Allergies  Allergen Reactions   Latex Rash   Methotrexate Rash   Methotrexate Derivatives Rash   Neomycin-Bacitracin Zn-Polymyx Rash   Tetracycline Swelling    CURRENT MEDICATIONS:  Current Meds  Medication Sig   amLODipine (NORVASC) 2.5 MG tablet 1 tablet   [place] Oil-Levomenthol (FDGARD PO) Take 1 tablet by mouth daily.   carbidopa-levodopa (SINEMET IR) 25-100 MG tablet Take 1 tablet by mouth 3 (three) times daily. 7am/11am/4pm   celecoxib (CELEBREX) 200 MG capsule Take 200 mg by mouth 2 (two) times daily as needed.   Cholecalciferol (VITAMIN D3) 5000 units TBDP Take 1 tablet by mouth daily.   clobetasol ointment (TEMOVATE) 0.05 % as needed.    Co-Enzyme Q-10 30 MG CAPS Take 100 mg by mouth.   diclofenac Sodium (VOLTAREN) 1 %  GEL as needed.   Golimumab ([place] ARIA IV) Inject into the vein. Every 8 weeks   hydroxychloroquine (PLAQUENIL) 200 MG tablet TAKE 2 TABLETS MONDAY THROUGH FRIDAY BUT NONE ON SATURDAY OR SUNDAY. (Patient taking differently: Every day even weekends)   irbesartan (AVAPRO) 75 MG tablet Take 300 mg by mouth daily. MD recommendation to go up to 300 mg   LIOTHYRONINE SODIUM PO Take 1 tablet by mouth daily. T4, T3 compounded medication   loratadine (CLARITIN) 10 MG tablet Take 10 mg by mouth as needed.    Multiple Vitamins-Minerals (HAIR/SKIN/NAILS/BIOTIN) TABS Take 1 tablet by mouth daily.   Nutritional Supplements (DHEA) 15-50 MG CAPS Take 15 mg by mouth daily.   omeprazole (PRILOSEC) 40 MG capsule TAKE (1) CAPSULE DAILY.   pravastatin (PRAVACHOL) 40 MG tablet 1 tablet   sucralfate (CARAFATE) 1 g tablet Take 1 tablet (1 g total) by mouth 2 (two) times daily. (Patient taking differently: Take 1 g by mouth as needed.)   tiZANidine (ZANAFLEX) 2 MG tablet Take by mouth as needed.   Current Facility-Administered Medications for the 09/03/21 encounter (Office Visit) with Michalina Calbert, Eustace Quail, DO  Medication   0.9 %  sodium chloride infusion     Objective:   PHYSICAL EXAMINATION:    VITALS:   Vitals:   09/03/21 1104  BP: (!) 142/68  Pulse: 72  SpO2: 98%  Weight: 196 lb 3.2 oz (89 kg)  Height: '5\' 3"'$  (1.6 m)  GEN:  The patient appears stated age and is in NAD. HEENT:  Normocephalic, atraumatic.  The mucous membranes are moist. The superficial temporal arteries are without ropiness or tenderness.   Neurological examination:  Orientation: The patient is alert and oriented x3. Cranial nerves: There is good facial symmetry with mild facial hypomimia. The speech is fluent and clear. Soft palate rises symmetrically and there is no tongue deviation. Hearing is intact to conversational tone. Sensation: Sensation is intact to light touch throughout Motor: Strength is at least antigravity  x4.  Movement examination: Tone: There is normal tone in the bilateral upper extremities.  The tone in the lower extremities is normal.  Abnormal movements: There is no tremor today Coordination:  There is no decremation, with any form of RAMS, including alternating supination and pronation of the forearm, hand opening and closing, finger taps, heel taps and toe taps. Gait and Station: The patient has no difficulty arising out of a deep-seated chair without the use of the hands. The patient's stride length is good.        Total time spent on today's visit was 22 minutes, including both face-to-face time and nonface-to-face time.  Time included that spent on review of records (prior notes available to me/labs/imaging if pertinent), discussing treatment and goals, answering patient's questions and coordinating care.  Cc:  Bernerd Limbo, MD

## 2021-09-03 ENCOUNTER — Encounter: Payer: Self-pay | Admitting: Neurology

## 2021-09-03 ENCOUNTER — Ambulatory Visit: Payer: Medicare PPO | Admitting: Neurology

## 2021-09-03 VITALS — BP 142/68 | HR 72 | Ht 63.0 in | Wt 196.2 lb

## 2021-09-03 DIAGNOSIS — G20 Parkinson's disease: Secondary | ICD-10-CM | POA: Diagnosis not present

## 2021-09-03 NOTE — Patient Instructions (Addendum)
As we discussed, it used to be thought that levodopa would increase risk of melanoma but now it is believed that Parkinsons itself likely increases risk of melanoma. I recommend yearly skin checks with a board certified dermatologist.  You can call [facility] or [facility] for an appointment.  [facility] Address: [address], [place], [place] [address] Phone: [phone]  [facility] [place], [place], [place] Phone: [phone]  We discussed the Parkinsons Disease gene study, called Parkinsons Disease GENEration.  You can find the information on the [employer]   Here are some resources/books that you may find helpful as you navigate the challenges of Parkinson's Disease  1.  Parkinson's treatement: 10 secrets to a happier life by Andy Gauss, MD 2.  Navigating Life with Parkinsons disease by [place] 3.  My degeneration: A journey through Parkinsons Orlando Penner - Shohl) 4.  Every Victory counts (I believe this one if free through [employer]) 5.  Lucky Man by Boykin Reaper 6.  101 Questions & Answers about Parkinson's by Donney Dice 7.  Parkinsons Disease Treatment Book by JE Ahiskog   Local and Online Resources for Power over Parkinson's Group June 2023  LOCAL [place] PARKINSON'S GROUPS  Power over Parkinson's Group:   Power Over Parkinson's Patient Education Group will be Wednesday, June 14th-*Hybrid meting*- in person at [facility] location and via [facility] at 2:00 pm.   Upcoming Power over [employer] Meetings:  2nd Wednesdays of the month at 2 pm:   June 14th, July 12th Contact Amy Marriott at [email or web address] if interested in participating in this group Parkinson's Care Partners Group:    3rd Mondays, Contact Misty Paladino Atypical Parkinsonian Patient Group:   4th Wednesdays, [place] If  you are interested in participating in these groups with Misty, please contact her directly for how to join those meetings.  Her contact information is [email or web address].    LOCAL EVENTS AND NEW OFFERINGS Dance Class for People with Parkinson's at [facility].  Friday, June 9th at 2 pm.  Dora Sims by Tyler Deis DPT students.  Contact [email or web address] to register or with questions. Ice Cream Social at [place]!  Thursday, June 15th, 5:30-7:00 pm.  RSVP to [email or web address] for attendance and free ice cream. Parkinson's T-shirts for sale!  Designed by a local group member, with funds going to [place].  $25.00  Investment banker, corporate to purchase  [employer]! Moves Dynegy Instructor-Led Class offering at [employer]!  Wednesdays 1-2 pm, starting April 12th.   Contact Bryson Dames, Acupuncturist at [employer].  [email or web address] [email or web address]  [place]:  www.[email or web address] PD Health at Home continues:  Mindfulness Mondays, Wellness Wednesdays, Fitness Fridays  Upcoming Education: Parkinson's 101:  What You and Your Family Should Know.  Wednesday, June 7th at 1:00 pm Register for expert briefings (webinars) at [email or web address] Please check out their website to sign up for emails and see their full online offerings   [place]:  [email or web address]  Third Thursday Webinars:  On the third Thursday of every month at 12 p.m. ET, join our free live webinars to learn about various aspects of living with Parkinson's disease and our work to speed medical breakthroughs. Upcoming Webinar: REPLAY:  From Low Blood Pressure to Bladder Problems:  A Look at Lesser Known Parkinson's Symptoms.  Thursday, June 15th at 12 noon. Check out additional information on their website to see their full online offerings  [place]:   [email or web address] Upcoming Webinar:   Stay tuned Webinar Series:  Living with Parkinson's Meetup.   Third Thursdays each month, 3 pm Care Partner Monthly Meetup.  With Robin Searing [place]ney.  First Tuesday of each month, 2 pm Check out additional information to Live Well Today on their website  Parkinson and Movement Disorders (PMD) Alliance:  www.pmdalliance.org NeuroLife Online:  Online Education Events Sign up for emails, which are sent weekly to give you updates on programming and online offerings  Parkinson's Association of the [place]:  www.parkinsonassociation.org Information on online support groups, education events, and online exercises including Yoga, Parkinson's exercises and more-LOTS of information on links to PD resources and online events Virtual Support Group through Parkinson's Association of the [place]; next one is scheduled for Wednesday, June 7th at 2 pm. (These are typically scheduled for the 1st Wednesday of the month at 2 pm).  Visit website for details. Save the date for "Caring for Parkinson's-Caring for You", 9th Annual Symposium.  In-person event in [place].  September 9th.  More info on registration to come. MOVEMENT AND EXERCISE OPPORTUNITIES PWR! Moves Classes at [place].  Wednesdays 10 and 11 am.   Contact Amy Gerrit Friends, PT [email or web address] if interested. NEW PWR! Moves Class offering at [employer].  Wednesdays 1-2 pm, starting April 12th.  Contact Bryson Dames, Acupuncturist at [employer].  [email or web address] Here is a link to the PWR!Moves classes on Zoom from [place] - Daily Mon-Sat at 10:00. Via Zoom, FREE and open to all.  There is also a link below via Facebook if you use that  platform.  [email or web address].si [email or web address]  Parkinson's Wellness Recovery (PWR! Moves)  www.[email or web address] Info on the PWR! Virtual Experience:  You will have access to our expertise through self-assessment, guided plans that start with the PD-specific fundamentals, educational content, tips, Q&A with an expert, and a growing Art therapist of PD-specific pre-recorded and live exercise classes of varying types and intensity - both physical and cognitive! If that is not enough, we offer 1:1 wellness consultations (in-person or virtual) to personalize your PWR! Research scientist (medical).  [place] Fridays:  As part of the PD Health @ Home program, this free video series focuses each week on one aspect of fitness designed to support people living with Parkinson's.  These weekly videos highlight the [place] recent fitness guidelines for people with Parkinson's disease. ModemGamers.si Dance for PD website is offering free, live-stream classes throughout the week, as well as links to [employer] of classes:  https://[email or web address]/ Virtual dance and Pilates for Parkinson's classes: Click on the Community Tab> Parkinson's Movement Initiative Tab.  To register for classes and for more information, visit [email or web address] and click the "community" tab.  [employer] Parkinson's Cycling Classes  [employer]:  Thursdays @ Noon-Live classes at [employer] ([employer] Net at [email or web address] or [phone]) [employer]: Virtual Classes Mondays and Thursdays Jeanette Caprice classes Tuesday, Wednesday and Thursday (contact [place] at [email or web address]  or [phone]) [place] Varied levels of  classes are offered Tuesdays and Thursdays at [facility].  Stretching with Verdis Frederickson weekly class is also offered for people with Parkinson's To observe a class or for more information, call [phone] or email Hezzie Bump at [email or web address] ADDITIONAL SUPPORT AND RESOURCES Well-Spring Solutions:Online Caregiver Education Opportunities:  www.well-springsolutions.org/caregiver-education/caregiver-support-group.  You may also contact Vickki Muff at [email or web address] or [phone].    Well-Spring Navigator:  [phone] program, a free service to help individuals and families through the journey of  determining care for older adults.  The "Navigator" is a Education officer, museum, Arnell Asal, who will speak with a prospective client and/or loved ones to provide an assessment of the situation and a set of recommendations for a personalized care plan -- all free of charge, and whether Well-Spring Solutions offers the needed service or not. If the need is not a service we provide, we are well-connected with reputable programs in town that we can refer you to.  www.well-springsolutions.org or to speak with the Navigator, call [phone]. Family Caregiver Programming in June:  Friends Against Fraud, Thursday, June 15th 11-12:30 at [place]. [place].  Call [phone] to register

## 2021-09-29 ENCOUNTER — Other Ambulatory Visit: Payer: Self-pay | Admitting: Neurology

## 2021-10-02 ENCOUNTER — Telehealth: Payer: Self-pay | Admitting: Physical Therapy

## 2021-10-02 DIAGNOSIS — G20 Parkinson's disease: Secondary | ICD-10-CM

## 2021-10-02 NOTE — Telephone Encounter (Signed)
Sally Diaz is scheduled for an upcoming return PD PT eval, as agreed upon at her discharge from previous bout of PT.  Could you please send order for PT eval and treat to [facility] location?  Thank you.  Mady Haagensen, PT 10/02/21 11:39 AM Phone: [phone] Fax: [phone]

## 2021-10-29 ENCOUNTER — Ambulatory Visit: Payer: Medicare PPO | Admitting: Occupational Therapy

## 2021-10-29 ENCOUNTER — Ambulatory Visit: Payer: Medicare PPO | Attending: Neurology

## 2021-10-29 ENCOUNTER — Encounter: Payer: Self-pay | Admitting: Physical Therapy

## 2021-10-29 ENCOUNTER — Other Ambulatory Visit: Payer: Self-pay

## 2021-10-29 ENCOUNTER — Encounter: Payer: Self-pay | Admitting: Neurology

## 2021-10-29 ENCOUNTER — Ambulatory Visit: Payer: Medicare PPO | Admitting: Physical Therapy

## 2021-10-29 DIAGNOSIS — R2681 Unsteadiness on feet: Secondary | ICD-10-CM

## 2021-10-29 DIAGNOSIS — R2689 Other abnormalities of gait and mobility: Secondary | ICD-10-CM

## 2021-10-29 DIAGNOSIS — R471 Dysarthria and anarthria: Secondary | ICD-10-CM | POA: Diagnosis present

## 2021-10-29 DIAGNOSIS — R29818 Other symptoms and signs involving the nervous system: Secondary | ICD-10-CM

## 2021-10-29 DIAGNOSIS — R293 Abnormal posture: Secondary | ICD-10-CM | POA: Insufficient documentation

## 2021-10-29 NOTE — Therapy (Signed)
OUTPATIENT PHYSICAL THERAPY NEURO EVALUATION   Patient Name: Sally Diaz MRN: [id number] DOB:10/28/1945, 76 y.o., female Today's Date: 10/29/2021   PCP:  Thomes Dinning MD  REFERRING PROVIDER: Alonza Bogus, DO   PT End of Session - 10/29/21 1109     Visit Number 1    Number of Visits 1    Authorization Type Humana Medicare    PT Start Time 1106    PT Stop Time 1146    PT Time Calculation (min) 40 min    Activity Tolerance Patient tolerated treatment well    Behavior During Therapy WFL for tasks assessed/performed             Past Medical History:  Diagnosis Date   Allergy, unspecified not elsewhere classified    Anemia, iron deficiency    Arthritis    Asthma    Basal cell carcinoma    Diverticulosis of colon    GERD (gastroesophageal reflux disease)    Hx of adenomatous colonic polyps    Hypercholesterolemia    Hypertension    Hypothyroidism    Obesity    Osteoporosis    Reflux esophagitis    Rheumatoid arthritis ([place])    Past Surgical History:  Procedure Laterality Date   CATARACT EXTRACTION, BILATERAL     CERVICAL CONE BIOPSY     COLONOSCOPY     ENDOMETRIAL ABLATION     TONSILLECTOMY     UPPER GASTROINTESTINAL ENDOSCOPY     Patient Active Problem List   Diagnosis Date Noted   Parkinson's disease ([place]) 04/03/2021   Acute midline low back pain without sciatica 06/17/2016   High risk medication use 03/02/2016   DDD (degenerative disc disease), lumbar 03/02/2016   Osteoarthritis, hand 03/02/2016   Osteoarthritis, knee 03/02/2016   Acute bronchitis 07/17/2012   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[place]) 11/11/2008   History of basal cell carcinoma 06/23/2007   Hypothyroidism 06/23/2007   HYPERCHOLESTEROLEMIA 06/23/2007   Essential hypertension 06/23/2007   Seasonal and perennial allergic rhinitis 06/23/2007   OBESITY, UNSPECIFIED 05/04/2007   ESOPHAGITIS, REFLUX 06/04/2005   DIVERTICULOSIS, COLON 06/04/2005    ONSET  DATE: 10/02/2021 (MD order placed from d/c approx 6 months ago)  REFERRING DIAG: Parkinson's disease   THERAPY DIAG:  Other abnormalities of gait and mobility  Unsteadiness on feet  Other symptoms and signs involving the nervous system  Abnormal posture  Rationale for Evaluation and Treatment Rehabilitation  SUBJECTIVE:  SUBJECTIVE STATEMENT: Been doing the exercises.   Pt accompanied by: self  PERTINENT HISTORY: hypothyroidism, hypercholesterolemia, RA, DDD-lumbar spine, OA   PAIN:  Are you having pain? No  PRECAUTIONS: Fall  WEIGHT BEARING RESTRICTIONS No  FALLS: Has patient fallen in last 6 months? No  LIVING ENVIRONMENT: Lives with: lives with their spouse Lives in: House/apartment Stairs: Yes: Internal: 1 steps; none and External: 2 steps; none Has following equipment at home:  walking pole  PLOF: Independent Performs PWR! Moves daily, from previous bout of therapy.  Enjoys gardening, working outside, Management consultant.  PATIENT GOALS Probably still need to work on slowing down and balancing.  OBJECTIVE:   DIAGNOSTIC FINDINGS: DaTscan correlates with symptoms.  DaTscan demonstrated decreased activity in the left putamen.  COGNITION: Overall cognitive status: Within functional limits for tasks assessed   SENSATION: WFL  MUSCLE TONE: WFL BLEs with P/ROM  POSTURE: No Significant postural limitations  LOWER EXTREMITY ROM:   WFL  Active  Right Eval Left Eval  Hip flexion    Hip extension    Hip abduction    Hip adduction    Hip internal rotation    Hip external rotation    Knee flexion    Knee extension    Ankle dorsiflexion    Ankle plantarflexion    Ankle inversion    Ankle eversion     (Blank rows = not tested)  LOWER EXTREMITY MMT:  Grossly tested 4+/5 to  5/5 BLEs  MMT Right Eval Left Eval  Hip flexion    Hip extension    Hip abduction    Hip adduction    Hip internal rotation    Hip external rotation    Knee flexion    Knee extension    Ankle dorsiflexion    Ankle plantarflexion    Ankle inversion    Ankle eversion    (Blank rows = not tested)   TRANSFERS: Assistive device utilized: None  Sit to stand: Modified independence Stand to sit: Modified independence  GAIT: Gait pattern: step through pattern and wide BOS Distance walked: 60 ft x 2 Assistive device utilized: None Level of assistance: Modified independence   FUNCTIONAL TESTs:  5 times sit to stand: 7.59 sec Timed up and go (TUG): 10.25 sec TUG cognitive:  12.63 sec (2 trials, miscounted and tried to walk again instead of sitting) 79M walk test:  (gait velocity):  9.75 sec = 3.36 ft/sec MiniBESTest:  25/28  [facility] PT Assessment - 10/29/21 0001       Mini-BESTest   Sit To Stand Normal: Comes to stand without use of hands and stabilizes independently.    Rise to Toes Moderate: Heels up, but not full range (smaller than when holding hands), OR noticeable instability for 3 s.    Stand on one leg (left) Moderate: < 20 s   13.31, 12.94   Stand on one leg (right) Moderate: < 20 s   1.75 sec, 2.81 sec   Stand on one leg - lowest score 1    Compensatory Stepping Correction - Forward Normal: Recovers independently with a single, large step (second realignement is allowed).    Compensatory Stepping Correction - Backward Normal: Recovers independently with a single, large step    Compensatory Stepping Correction - Left Lateral Normal: Recovers independently with 1 step (crossover or lateral OK)    Compensatory Stepping Correction - Right Lateral Normal: Recovers independently with 1 step (crossover or lateral OK)    Stepping Corredtion Lateral - lowest score  2    Stance - Feet together, eyes open, firm surface  Normal: 30s    Stance - Feet together, eyes closed, foam  surface  Normal: 30s    Incline - Eyes Closed Normal: Stands independently 30s and aligns with gravity    Change in Gait Speed Normal: Significantly changes walkling speed without imbalance    Walk with head turns - Horizontal Normal: performs head turns with no change in gait speed and good balance    Walk with pivot turns Normal: Turns with feet close FAST (< 3 steps) with good balance.    Step over obstacles Normal: Able to step over box with minimal change of gait speed and with good balance.    Timed UP & GO with Dual Task Moderate: Dual Task affects either counting OR walking (>10%) when compared to the TUG without Dual Task.    Mini-BEST total score 25              TODAY'S TREATMENT:  NA   PATIENT EDUCATION: Education details: Eval results, POC (no further PT needs at this time); re-printed HEP from previous bout of therapy and pt verbalizes she will restart these ex Person educated: Patient Education method: Explanation and Handouts Education comprehension: verbalized understanding   HOME EXERCISE PROGRAM: Access Code: [email or web address] URL: [email or web address]    GOALS:  NO GOALS set at this time, as pt does not have PT needs at this time.   ASSESSMENT:  CLINICAL IMPRESSION: Patient is a 76 y.o. female who was seen today for return physical therapy evaluation for Parkinson's disease.  This is a return eval from her d/c on 05/20/21.  She demonstrates slight slowing on TUG/TUG cognitive scores and gait velocity as compared to d/c.  Five time sit<>stand and MiniBESTest scores are the same as discharge measures.  Overall, she does not demonstrate increased fall risk and she does not report or demo significant change/decrease in functional measures to warrant further PT at this time.  Therapist supports patient in her performing daily PWR! Moves at home and recommends return to previous balance HEP and walking (which pt plans to do).  Based on how patient scored today  at return eval, PT recommends screen in 6-9 months.    OBJECTIVE IMPAIRMENTS Abnormal gait and decreased balance.   ACTIVITY LIMITATIONS  NA  PARTICIPATION LIMITATIONS:  NA  PERSONAL FACTORS 3+ comorbidities: see above  are also affecting patient's functional outcome.   REHAB POTENTIAL: Excellent  CLINICAL DECISION MAKING: Stable/uncomplicated  EVALUATION COMPLEXITY: Low  PLAN: PT FREQUENCY: one time visit  PT DURATION: other: eval only  PLANNED INTERVENTIONS: NA-eval only  PLAN FOR NEXT SESSION: No further PT needs at this time; recommend PT screen in 6-9 months due to progressive nature of disease.   Frazier Butt., PT 10/29/2021, 12:21 PM  [facility] [place], [place], [place] [address] Phone # [phone] Fax # [phone]

## 2021-10-29 NOTE — Therapy (Signed)
[facility] [address] [place], [place], [address] Phone: [phone]   Fax:  [phone]  Patient Details  Name: Sally Diaz MRN: [id number] Date of Birth: 19-Apr-1945 Referring Provider:  Rosalva Ferron, DO  Encounter Date: 10/29/2021  Speech Therapy Parkinson's Disease Screen   Decibel Level today: 71dB  (WNL=70-72 dB) with sound level meter 30cm away from pt's mouth. Pt's conversational volume is WNLat this time.  Pt does not report difficulty with swallowing warranting further evaluation.  Pt does does not require speech therapy services at this time. Recommend ST screen in another 6-9 months. She was told if she had difficulty with swallowing or speech not to wait until next  ST screen or appointment with Dr. Carles Collet but to inform Dr. Carles Collet and Dr. Carles Collet will refer to ST if she thinks it is necessary.   [facility], [place] 10/29/2021, 12:37 PM  [facility] [address] [place], [place], [address] Phone: [phone]   Fax:  [phone]

## 2022-01-30 NOTE — Progress Notes (Unsigned)
07/11/12- 66yoF never smoker followed for allergic rhinitis  FOLLOWS FOR: last seen 02-2010; pt states she is having trouble with breathing and cough; was around old house and raking leaves. Started Mucinex to help to bring up mucus. Also, states she is loosing her voice. Drainage in nasal area is green. Starting 10 days ago when she rates leaves and cleaned an old house that was mold in dusty. Green from nose, white from chest. Had some frontal headache initially and some wheeze with cough. Husband had had a cold. She took Mucinex, old cough syrup and cough drops.  07/12/13- 67 yoF never smoker followed for allergic rhinitis, complicated by RA, HBP, GERD  FOLLOWS FOR: Pt states she is doing very well. Pt notices intermittent cough with and without mucus. Denies SOB and CP. Reports a good year with no special problems since last here. Trying to wean off Prilosec so we discussed interaction of reflux with respiratory complaints  07/10/14-  67 yoF never smoker followed for allergic rhinitis, complicated by RA, HBP, GERD  FOLLOWS FOR: Pt state since going to the mtns that she can breathe better; felt drainage coming back since being home now.  She stated week at [facility] before tree pollen since started and felt much better. Has noted nasal congestion on returning to [place]. Using ranitidine 75 mg once daily. Postnasal drainage increases cough which seems to increase her reflux. --------------------------------------------------------- 02/01/22- 76 yoF never smoker coming to re-establish with concern of cough Medical problem list includes HTN, Allergic Rhinitis, Asthma, GERD, Diverticulosis, Hypothyroid, Parkinson's, Rheumatoid Arthritis,Degenerative Disc Disease,  Obesity, Hypercholesterolemia, Osteoarthritis,  -loratadine,  albuterol hfa,  Covid vax-4 Phizer Flu vax- had -----Pt states she has had an intermittent productive cough. Pt was seen at family practitioner facility and was treated  for symptoms, she states she is feeling much better now  She had been doing very well for several years after last here. Starting about 2 weeks ago she had acute illness with productive cough, yellow sputum. No fever. Known GERD but no recognized aspiration event. Family practice office on 11/1 tested neg Covid. Treated Zpak, prednisone taper, albuterol hfa, benzonatate 200. She is largely back to normal. Still some hoarseness and taking throat lozenges. She had madee this appt and decided to keep it.  ROS-see HPI Constitutional:   No-   weight loss, night sweats, fevers, chills, fatigue, lassitude. HEENT:   + headaches, difficulty swallowing, tooth/dental problems, sore throat,       No-  sneezing, itching, ear ache, +nasal congestion, post nasal drip,  CV:  No-   chest pain, orthopnea, PND, swelling in lower extremities, anasarca,                                                         dizziness, palpitations Resp: No-   shortness of breath with exertion or at rest.              No-productive cough,  + non-productive cough,  No- coughing up of blood.              No-   change in color of mucus.  No-wheezing.   Skin: No-   rash or lesions. GI:  + heartburn, indigestion, abdominal pain, nausea, vomiting, GU:  MS:  No-   joint pain or swelling.  Neuro-     nothing unusual Psych:  No- change in mood or affect. No depression or anxiety.  No memory loss.  OBJ- Physical Exam General- Alert, Oriented, Affect-appropriate, Distress- none acute+overweight Skin- rash-none, lesions- none, excoriation- none Lymphadenopathy- none Head- atraumatic            Eyes- Gross vision intact, PERRLA, conjunctivae and secretions clear            Ears- Hearing, canals-normal            Nose- n, no-Septal dev, mucus, polyps, erosion, perforation ,             Throat- Mallampati II , mucosared , drainage- none, tonsils- atrophic. +hoarse Neck- flexible , trachea midline, no stridor , thyroid nl, carotid no  bruit Chest - symmetrical excursion , unlabored           Heart/CV- RRR , no murmur , no gallop  , no rub, nl s1 s2                           - JVD- none , edema- none, stasis changes- none, varices- none           Lung- clear to P&A, wheeze- none, cough- none , dullness-none, rub- none           Chest wall-  Abd-  Br/ Gen/ Rectal- Not done, not indicated Extrem- cyanosis- none, clubbing, none, atrophy- none, strength- nl Neuro- +tremor

## 2022-02-01 ENCOUNTER — Encounter: Payer: Self-pay | Admitting: Internal Medicine

## 2022-02-01 ENCOUNTER — Ambulatory Visit: Payer: Medicare PPO | Admitting: Internal Medicine

## 2022-02-01 ENCOUNTER — Ambulatory Visit (INDEPENDENT_AMBULATORY_CARE_PROVIDER_SITE_OTHER): Payer: Medicare PPO

## 2022-02-01 VITALS — BP 134/78 | HR 91 | Ht 62.0 in | Wt 196.4 lb

## 2022-02-01 DIAGNOSIS — G20A1 Parkinson's disease without dyskinesia, without mention of fluctuations: Secondary | ICD-10-CM

## 2022-02-01 DIAGNOSIS — J209 Acute bronchitis, unspecified: Secondary | ICD-10-CM | POA: Diagnosis not present

## 2022-02-01 DIAGNOSIS — K2100 Gastro-esophageal reflux disease with esophagitis, without bleeding: Secondary | ICD-10-CM

## 2022-02-01 NOTE — Assessment & Plan Note (Signed)
Recent acute bronchitis, responding to appropriate treatment. Plan- CXR. Notify us if relapses

## 2022-02-01 NOTE — Assessment & Plan Note (Signed)
Discussed potential for aspiration to cause similar symptoms Plan- continue acid blocker, reflux precautions

## 2022-02-01 NOTE — Assessment & Plan Note (Signed)
-  Followed by Neurology. °

## 2022-02-01 NOTE — Patient Instructions (Addendum)
Order- CXR    dx acute bronchitis, Rheumatoid arthritis  Please call if we can help

## 2022-02-05 ENCOUNTER — Telehealth: Payer: Self-pay | Admitting: Internal Medicine

## 2022-02-05 DIAGNOSIS — I7090 Unspecified atherosclerosis: Secondary | ICD-10-CM

## 2022-02-05 NOTE — Telephone Encounter (Signed)
Called and spoke with patient. She stated that she would like to have a referral to see Dr. Percival Spanish or Dr. Claiborne Billings for atherosclerosis found on her recent CXR. She would rather see a cardiologist vs discussing this with her PCP.   Dr. Annamaria Boots, please advise if you are ok with this referral. Thanks!

## 2022-02-08 NOTE — Telephone Encounter (Signed)
Fine to refer to cardiologist Hochrein or Claiborne Billings as she requests    Atherosclerosis on imaging

## 2022-02-10 NOTE — Telephone Encounter (Signed)
Order has been placed for pt to be referred to cardiology. Called and spoke with pt letting her know this had been done and she verbalized understanding. Nothing further needed.

## 2022-02-15 NOTE — Telephone Encounter (Signed)
Patient states she is not feeling better from last office visit. She states she is wheezing and still has a cough, white to yellowish mucous and not sleeping do to the wheezing and cough.

## 2022-02-16 ENCOUNTER — Ambulatory Visit: Payer: Medicare PPO | Admitting: Internal Medicine

## 2022-02-16 ENCOUNTER — Encounter: Payer: Self-pay | Admitting: Internal Medicine

## 2022-02-16 VITALS — BP 130/72 | HR 74 | Ht 62.5 in | Wt 196.6 lb

## 2022-02-16 DIAGNOSIS — R079 Chest pain, unspecified: Secondary | ICD-10-CM | POA: Diagnosis not present

## 2022-02-16 DIAGNOSIS — J4541 Moderate persistent asthma with (acute) exacerbation: Secondary | ICD-10-CM

## 2022-02-16 DIAGNOSIS — K2100 Gastro-esophageal reflux disease with esophagitis, without bleeding: Secondary | ICD-10-CM | POA: Diagnosis not present

## 2022-02-16 DIAGNOSIS — I10 Essential (primary) hypertension: Secondary | ICD-10-CM | POA: Diagnosis not present

## 2022-02-16 MED ORDER — TRELEGY ELLIPTA 100-62.5-25 MCG/ACT IN AEPB: 1.0000 | INHALATION_SPRAY | Freq: Every day | RESPIRATORY_TRACT | 0 refills | Status: DC

## 2022-02-16 NOTE — Assessment & Plan Note (Signed)
She gives hx labile hypertension and asks to be seen again for this by cardiology. Plan- referral to cardiology.

## 2022-02-16 NOTE — Telephone Encounter (Signed)
Pt calling back, sent secure chat to Dr. Annamaria Boots to see if we can work patient in.

## 2022-02-16 NOTE — Telephone Encounter (Signed)
Dr. Young - please advise. 

## 2022-02-16 NOTE — Assessment & Plan Note (Addendum)
Symptoms controlled on omeprazole. Explained mechanical reflux may still be happening. Suspect her chest pain relieved by belching was esophageal. She does ask referral back to cardiology who has seen before.

## 2022-02-16 NOTE — Patient Instructions (Signed)
Order- referral to Cardiology- Dr Claiborne Billings or Dr Percival Spanish if available (patient request)- former patient with chest pain and HTN.  Order- sample x 2 Trelegy 100 inhaler     inhale 1 puff then rinse mouth, once daily.   You can still use your albuterol rescue inhaler as before, as needed.  Continue your stomach acid treatment.

## 2022-02-16 NOTE — Assessment & Plan Note (Signed)
Triggers have included environmental triggers and components of postnasal drip and reflux. She does have some wheeze now. Plan- sample trial Trelegy 100 inhaler

## 2022-02-16 NOTE — Progress Notes (Signed)
07/11/12- 66yoF never smoker followed for allergic rhinitis  FOLLOWS FOR: last seen 02-2010; pt states she is having trouble with breathing and cough; was around old house and raking leaves. Started Mucinex to help to bring up mucus. Also, states she is loosing her voice. Drainage in nasal area is green. Starting 10 days ago when she rates leaves and cleaned an old house that was mold in dusty. Green from nose, white from chest. Had some frontal headache initially and some wheeze with cough. Husband had had a cold. She took Mucinex, old cough syrup and cough drops.  07/12/13- 67 yoF never smoker followed for allergic rhinitis, complicated by RA, HBP, GERD  FOLLOWS FOR: Pt states she is doing very well. Pt notices intermittent cough with and without mucus. Denies SOB and CP. Reports a good year with no special problems since last here. Trying to wean off Prilosec so we discussed interaction of reflux with respiratory complaints  07/10/14-  67 yoF never smoker followed for allergic rhinitis, complicated by RA, HBP, GERD  FOLLOWS FOR: Pt state since going to the mtns that she can breathe better; felt drainage coming back since being home now.  She stated week at [facility] before tree pollen since started and felt much better. Has noted nasal congestion on returning to [place]. Using ranitidine 75 mg once daily. Postnasal drainage increases cough which seems to increase her reflux. --------------------------------------------------------- 02/01/22- 76 yoF never smoker coming to re-establish with concern of cough Medical problem list includes HTN, Allergic Rhinitis, Asthma, GERD, Diverticulosis, Hypothyroid, Parkinson's, Rheumatoid Arthritis,Degenerative Disc Disease,  Obesity, Hypercholesterolemia, Osteoarthritis,  -loratadine,  albuterol hfa,  Covid vax-4 Phizer Flu vax- had -----Pt states she has had an intermittent productive cough. Pt was seen at family practitioner facility and was treated  for symptoms, she states she is feeling much better now  She had been doing very well for several years after last here. Starting about 2 weeks ago she had acute illness with productive cough, yellow sputum. No fever. Known GERD but no recognized aspiration event. Family practice office on 11/1 tested neg Covid. Treated Zpak, prednisone taper, albuterol hfa, benzonatate 200. She is largely back to normal. Still some hoarseness and taking throat lozenges. She had madee this appt and decided to keep it.  02/16/22- 76 yoF never smoker followed for Chronic Cough, complicated by HTN, Allergic Rhinitis, Asthma, GERD, Diverticulosis, Hypothyroid, Parkinson's, Rheumatoid Arthritis,Degenerative Disc Disease,  Obesity, Hypercholesterolemia, Osteoarthritis, -loratadine,  albuterol hfa, omeprazole,  Covid vax-4 Phizer Flu vax- had -----Pt states symptoms of SOB, coughing, and wheezing have gotten worse since last visit Similar experience in past, where increased cough and wheeze were associated with exposure to leaf dust and old house. Increased postnasal drip would aggravate reflux. Coming down from [place] recently she had substernal pressure pain relieved by belching. Milder, similar pain a day or two later. She continues omeprazole and denies reflux or heart burn. Rescue inhaler gives modest help. Wheeze interferes with sleep. Scant white phlegm. No fever. She asks referral to cardiology for chest pain and labile hypertension- has been seen before for this. CXR 02/01/22- MPRESSION: No acute disease.  Aortic Atherosclerosis (ICD10-I70.0).    ROS-see HPI Constitutional:   No-   weight loss, night sweats, fevers, chills, fatigue, lassitude. HEENT:   + headaches, difficulty swallowing, tooth/dental problems, sore throat,       No-  sneezing, itching, ear ache, +nasal congestion, post nasal drip,  CV:  No-   chest pain, orthopnea, PND, swelling in  lower extremities, anasarca,                                                          dizziness, palpitations Resp: No-   shortness of breath with exertion or at rest.              No-productive cough,  + non-productive cough,  No- coughing up of blood.              No-   change in color of mucus.  +-wheezing.   Skin: No-   rash or lesions. GI:  + heartburn, indigestion, abdominal pain, nausea, vomiting, GU:  MS:  No-   joint pain or swelling.   Neuro-     nothing unusual Psych:  No- change in mood or affect. No depression or anxiety.  No memory loss.  OBJ- Physical Exam General- Alert, Oriented, Affect-appropriate, Distress- none acute+overweight Skin- rash-none, lesions- none, excoriation- none Lymphadenopathy- none Head- atraumatic            Eyes- Gross vision intact, PERRLA, conjunctivae and secretions clear            Ears- Hearing, canals-normal            Nose- n, no-Septal dev, mucus, polyps, erosion, perforation ,             Throat- Mallampati II , mucosared , drainage- none, tonsils- atrophic. +hoarse Neck- flexible , trachea midline, no stridor , thyroid nl, carotid no bruit Chest - symmetrical excursion , unlabored           Heart/CV- RRR , no murmur , no gallop  , no rub, nl s1 s2                           - JVD- none , edema- none, stasis changes- none, varices- none           Lung-  +wheeze at end exhalation, cough with laughter , dullness-none, rub- none           Chest wall-  Abd-  Br/ Gen/ Rectal- Not done, not indicated Extrem- cyanosis- none, clubbing, none, atrophy- none, strength- nl Neuro- +tremor

## 2022-02-19 MED ORDER — BREZTRI AEROSPHERE 160-9-4.8 MCG/ACT IN AERO: 2.0000 | INHALATION_SPRAY | Freq: Two times a day (BID) | RESPIRATORY_TRACT | 0 refills | Status: DC

## 2022-02-19 NOTE — Telephone Encounter (Signed)
If we have them- please let her pick up 22 samples of Breztri inhaler  to try instead of Trelegy Inhale 2 puffs, then rinse mouth, twice daily

## 2022-02-19 NOTE — Telephone Encounter (Signed)
Notified pt of inhaler change and provider her with samples. Pt stated understanding of usage. Samples placed up front for pick up. Nothing further needed at this time.

## 2022-02-19 NOTE — Telephone Encounter (Signed)
Spoke with pt who states dry cough and SOB has increased. Pt states she has used her Trelegy as directed for the last 3 days but does not see any improvement. Pt would like to know if Dr. Annamaria Boots advises to contuine Trelegy use or change to another inhaler. Dr. Annamaria Boots please advise.

## 2022-03-07 DIAGNOSIS — R072 Precordial pain: Secondary | ICD-10-CM | POA: Insufficient documentation

## 2022-03-07 NOTE — Progress Notes (Unsigned)
Cardiology Office Note   Date:  03/09/2022   ID:  Sally Diaz, DOB 23-Mar-1945, MRN [id number]  PCP:  Pcp, No  Cardiologist:   Minus Breeding, MD Referring:  Deneise Lever, MD  Chief Complaint  Patient presents with   Precordial chest pain      History of Present Illness: Sally Diaz is a 76 y.o. female who presents for evaluation of aortic atherosclerosis.  She also had some chest discomfort.  She is referred by Deneise Lever, MD. she does have some cardiovascular risk factors although she has no past cardiac history.  She had some chest discomfort a couple of months ago.  This was a substernal discomfort.  It was while [place]  It was moderate in intensity.  They actually pulled into hospital parking lot but she burped and went away.  She has been able to do some exercising since then without bringing this on.  She has been having problems with.  Bronchitis and has had rounds of antibiotics and steroids.  She had a lot of wheezing associated with this.  During that she had a chest x-ray which demonstrated aortic atherosclerosis.  She has not previously been told she had any cardiac issues.  He does have family history.   Past Medical History:  Diagnosis Date   Allergy, unspecified not elsewhere classified    Anemia, iron deficiency    Arthritis    Asthma    Basal cell carcinoma    Diverticulosis of colon    GERD (gastroesophageal reflux disease)    Hx of adenomatous colonic polyps    Hypercholesterolemia    Hypertension    Hypothyroidism    Obesity    Osteoporosis    Reflux esophagitis    Rheumatoid arthritis ([place])     Past Surgical History:  Procedure Laterality Date   CATARACT EXTRACTION, BILATERAL     CERVICAL CONE BIOPSY     COLONOSCOPY     ENDOMETRIAL ABLATION     TONSILLECTOMY     UPPER GASTROINTESTINAL ENDOSCOPY       Current Outpatient Medications  Medication Sig Dispense Refill   albuterol (VENTOLIN HFA)  108 (90 Base) MCG/ACT inhaler Inhale into the lungs every 6 (six) hours as needed for wheezing or shortness of breath.     Budeson-Glycopyrrol-Formoterol (BREZTRI AEROSPHERE) 160-9-4.8 MCG/ACT AERO Inhale 2 puffs into the lungs in the morning and at bedtime. 5.9 g 0   Caraway Oil-Levomenthol (FDGARD PO) Take 1 tablet by mouth daily.     carbidopa-levodopa (SINEMET IR) 25-100 MG tablet TAKE ONE TABLET BY MOUTH THREE TIMES DAILY AT 7:00 AM, 11:00 AM AND 4:00PM 270 tablet 1   celecoxib (CELEBREX) 200 MG capsule Take 200 mg by mouth 2 (two) times daily as needed.     Cholecalciferol (VITAMIN D3) 5000 units TBDP Take 1 tablet by mouth daily.     clobetasol ointment (TEMOVATE) 0.05 % as needed.      Co-Enzyme Q-10 30 MG CAPS Take 100 mg by mouth.     diclofenac Sodium (VOLTAREN) 1 % GEL as needed.     fish oil-omega-3 fatty acids 1000 MG capsule Take 2 capsules by mouth daily. 1 daily     Fluticasone-Umeclidin-Vilant (TRELEGY ELLIPTA) 100-62.5-25 MCG/ACT AEPB Inhale 1 puff into the lungs daily. 2 each 0   Golimumab ([place] ARIA IV) Inject into the vein. Every 8 weeks     hydroxychloroquine (PLAQUENIL) 200 MG tablet TAKE 2 TABLETS  MONDAY THROUGH FRIDAY BUT NONE ON SATURDAY OR SUNDAY. (Patient taking differently: Every day even weekends) 120 tablet 0   irbesartan (AVAPRO) 75 MG tablet Take 300 mg by mouth daily. MD recommendation to go up to 300 mg     LIOTHYRONINE SODIUM PO Take 1 tablet by mouth daily. T4, T3 compounded medication     Nutritional Supplements (DHEA) 15-50 MG CAPS Take 15 mg by mouth daily.     omeprazole (PRILOSEC) 40 MG capsule TAKE (1) CAPSULE DAILY. 90 capsule 3   pravastatin (PRAVACHOL) 40 MG tablet 1 tablet     sucralfate (CARAFATE) 1 g tablet Take 1 tablet (1 g total) by mouth 2 (two) times daily. (Patient taking differently: Take 1 g by mouth as needed.) 60 tablet 3   tiZANidine (ZANAFLEX) 2 MG tablet Take by mouth as needed.     vitamin E 400 UNIT capsule Take by mouth.      Current Facility-Administered Medications  Medication Dose Route Frequency Provider Last Rate Last Admin   0.9 %  sodium chloride infusion  500 mL Intravenous Once Nandigam, Kavitha V, MD        Allergies:   Latex, Methotrexate, Methotrexate derivatives, Neomycin-bacitracin zn-polymyx, and Tetracycline    Social History:  The patient  reports that she has never smoked. She has never used smokeless tobacco. She reports that she does not drink alcohol and does not use drugs.   Family History:  The patient's family history includes AAA (abdominal aortic aneurysm) in her brother; Aneurysm in her maternal grandmother; Bladder Cancer in her mother; Colon cancer in her cousin; Diabetes in her maternal grandmother; Heart disease in her mother; Kidney cancer in her mother; Lung cancer in her brother; Parkinson's disease in her father; Pneumonia in her father; Stroke in her father.    ROS:  Please see the history of present illness.   Otherwise, review of systems are positive for none.   All other systems are reviewed and negative.    PHYSICAL EXAM: VS:  BP 122/78   Pulse 84   Ht '5\' 2"'$  (1.575 m)   Wt 195 lb 9.6 oz (88.7 kg)   SpO2 97%   BMI 35.78 kg/m  , BMI Body mass index is 35.78 kg/m. GENERAL:  Well appearing HEENT:  Pupils equal round and reactive, fundi not visualized, oral mucosa unremarkable NECK:  No jugular venous distention, waveform within normal limits, carotid upstroke brisk and symmetric, no bruits, no thyromegaly LYMPHATICS:  No cervical, inguinal adenopathy LUNGS:  Clear to auscultation bilaterally BACK:  No CVA tenderness CHEST:  Unremarkable HEART:  PMI not displaced or sustained,S1 and S2 within normal limits, no S3, no S4, no clicks, no rubs, soft brief right upper sternal border systolic murmur nonradiating, no diastolic murmurs ABD:  Flat, positive bowel sounds normal in frequency in pitch, no bruits, no rebound, no guarding, no midline pulsatile mass, no  hepatomegaly, no splenomegaly EXT:  2 plus pulses throughout, mild bilateral leg edema, no cyanosis no clubbing SKIN:  No rashes no nodules NEURO:  Cranial nerves II through XII grossly intact, motor grossly intact throughout PSYCH:  Cognitively intact, oriented to person place and time    EKG:  EKG is ordered today. The ekg ordered today demonstrates sinus rhythm, rate 84, left axis deviation, early transition lead V2, moderate voltage criteria for left ventricular hypertrophy, no acute ST-T wave changes.   Recent Labs: No results found for requested labs within last 365 days.    Lipid Panel No  results found for: "CHOL", "TRIG", "HDL", "CHOLHDL", "VLDL", "LDLCALC", "LDLDIRECT"    Wt Readings from Last 3 Encounters:  03/09/22 195 lb 9.6 oz (88.7 kg)  02/16/22 196 lb 9.6 oz (89.2 kg)  02/01/22 196 lb 6.4 oz (89.1 kg)      Other studies Reviewed: Additional studies/ records that were reviewed today include: Labs. Review of the above records demonstrates:  Please see elsewhere in the note.     ASSESSMENT AND PLAN:  Precordial chest pain:  I will bring the patient back for a POET (Plain Old Exercise Test). This will allow me to screen for obstructive coronary disease, risk stratify and very importantly provide a prescription for exercise.  HTN: Her blood pressure actually was high.  She was on Norvasc for a while but she thought this exacerbated wheezing.  She came off of this and has been on the current dose of ARB quite a while.  She does well with this and recent blood pressure is well-controlled.  No change in therapy  Dyslipidemia: I do not see labs for this year but she says they were improved compared to last year.  She will try to get these results.  I suggested a goal LDL in the 70s.  Her LDL was 106 apparently prior to the low-dose of statin.  We talked about a plant-based diet.   Current medicines are reviewed at length with the patient today.  The patient does not  have concerns regarding medicines.  The following changes have been made:  no change  Labs/ tests ordered today include:   Orders Placed This Encounter  Procedures   Exercise Tolerance Test   EKG 12-Lead     Disposition:   FU with me based on the results of the above   Signed, Minus Breeding, MD  03/09/2022 4:12 PM    [place]

## 2022-03-09 ENCOUNTER — Ambulatory Visit: Payer: Medicare PPO | Attending: Cardiology | Admitting: Cardiology

## 2022-03-09 ENCOUNTER — Encounter: Payer: Self-pay | Admitting: Cardiology

## 2022-03-09 VITALS — BP 122/78 | HR 84 | Ht 62.0 in | Wt 195.6 lb

## 2022-03-09 DIAGNOSIS — I10 Essential (primary) hypertension: Secondary | ICD-10-CM | POA: Diagnosis not present

## 2022-03-09 DIAGNOSIS — R072 Precordial pain: Secondary | ICD-10-CM | POA: Diagnosis not present

## 2022-03-09 NOTE — Patient Instructions (Signed)
Medication Instructions:  Your physician recommends that you continue on your current medications as directed. Please refer to the Current Medication list given to you today.  *If you need a refill on your cardiac medications before your next appointment, please call your pharmacy*   Lab Work: NONE If you have labs (blood work) drawn today and your tests are completely normal, you will receive your results only by: [place] (if you have MyChart) OR A paper copy in the mail If you have any lab test that is abnormal or we need to change your treatment, we will call you to review the results.   Testing/Procedures: Your physician has requested that you have an exercise tolerance test. For further information please visit [email or web address]. Please also follow instruction sheet, as given.    Follow-Up: At [facility], you and your health needs are our priority.  As part of our continuing mission to provide you with exceptional heart care, we have created designated Provider Care Teams.  These Care Teams include your primary Cardiologist (physician) and Advanced Practice Providers (APPs -  Physician Assistants and Nurse Practitioners) who all work together to provide you with the care you need, when you need it.  We recommend signing up for the patient portal called "MyChart".  Sign up information is provided on this After Visit Summary.  MyChart is used to connect with patients for Virtual Visits (Telemedicine).  Patients are able to view lab/test results, encounter notes, upcoming appointments, etc.  Non-urgent messages can be sent to your provider as well.   To learn more about what you can do with MyChart, go to [email or web address].    Your next appointment:   As Needed   The format for your next appointment:   In Person  Provider:   Minus Breeding, MD     Other Instructions Your physician has requested that you have an exercise tolerance test.  Please also follow  instruction sheet, as given. This will take place at [address] Do not drink or eat foods with caffeine for 24 hours before the test. (Chocolate, coffee, tea, or energy drinks) If you use an inhaler, bring it with you to the test. Do not smoke for 4 hours before the test. Wear comfortable shoes and clothing.   Important Information About Sugar

## 2022-03-09 NOTE — Addendum Note (Signed)
Addended by: Linton Ham on: 03/09/2022 04:20 PM   Modules accepted: Orders

## 2022-03-09 NOTE — Progress Notes (Signed)
Assessment/Plan:   1.  ET/PD  -DaTscan correlates with symptoms.  DaTscan demonstrated decreased activity in the left putamen.  -Patient's father diagnosed with Parkinson's disease in his later life as well.  Discussed Parkinsons Disease GENEration study via PF  -Continue carbidopa/levodopa 1 po tid  -We discussed that it used to be thought that levodopa would increase risk of melanoma but now it is believed that Parkinsons itself likely increases risk of melanoma. she is to get regular skin checks.  She is doing that.  Last skin check was in sept 2024    Subjective:   Sally Diaz was seen today in follow up for ET/PD. This patient is accompanied in the office by her spouse who supplements the history.  Pt denies falls.  Pt denies lightheadedness, near syncope.  No hallucinations.  Mood has been good.  States that exercise has not been as good as she would like but she is walking 25 min per day.    Current movement disorder medications: Carbidopa/levodopa 25/100, 1 tablet 3 times per day  ALLERGIES:   Allergies  Allergen Reactions   Latex Rash   Methotrexate Rash   Methotrexate Derivatives Rash   Neomycin-Bacitracin Zn-Polymyx Rash   Tetracycline Swelling    CURRENT MEDICATIONS:  Current Meds  Medication Sig   Caraway Oil-Levomenthol (FDGARD PO) Take 1 tablet by mouth daily.   carbidopa-levodopa (SINEMET IR) 25-100 MG tablet TAKE ONE TABLET BY MOUTH THREE TIMES DAILY AT 7:00 AM, 11:00 AM AND 4:00PM   celecoxib (CELEBREX) 200 MG capsule Take 200 mg by mouth 2 (two) times daily as needed.   Cholecalciferol (VITAMIN D3) 5000 units TBDP Take 1 tablet by mouth daily.   clobetasol ointment (TEMOVATE) 0.05 % as needed.    Co-Enzyme Q-10 30 MG CAPS Take 100 mg by mouth.   diclofenac Sodium (VOLTAREN) 1 % GEL as needed.   fish oil-omega-3 fatty acids 1000 MG capsule Take 2 capsules by mouth daily. 1 daily   Golimumab ([place] ARIA IV) Inject into the vein. Every 8 weeks    hydroxychloroquine (PLAQUENIL) 200 MG tablet TAKE 2 TABLETS MONDAY THROUGH FRIDAY BUT NONE ON SATURDAY OR SUNDAY. (Patient taking differently: Every day even weekends)   irbesartan (AVAPRO) 75 MG tablet Take 300 mg by mouth daily. MD recommendation to go up to 300 mg   LIOTHYRONINE SODIUM PO Take 1 tablet by mouth daily. T4, T3 compounded medication   Nutritional Supplements (DHEA) 15-50 MG CAPS Take 15 mg by mouth daily.   omeprazole (PRILOSEC) 40 MG capsule TAKE (1) CAPSULE DAILY.   pravastatin (PRAVACHOL) 40 MG tablet 1 tablet   sucralfate (CARAFATE) 1 g tablet Take 1 tablet (1 g total) by mouth 2 (two) times daily. (Patient taking differently: Take 1 g by mouth as needed.)   Current Facility-Administered Medications for the 03/11/22 encounter (Office Visit) with Madelaine Whipple, Octaviano Batty, DO  Medication   0.9 %  sodium chloride infusion     Objective:   PHYSICAL EXAMINATION:    VITALS:   Vitals:   03/11/22 1301  BP: (!) 142/80  Pulse: 63  SpO2: 99%  Weight: 198 lb (89.8 kg)  Height: 5\' 2"  (1.575 m)      GEN:  The patient appears stated age and is in NAD. HEENT:  Normocephalic, atraumatic.  The mucous membranes are moist. The superficial temporal arteries are without ropiness or tenderness.   Neurological examination:  Orientation: The patient is alert and oriented x3. Cranial nerves: There is good  facial symmetry with mild facial hypomimia. The speech is fluent and clear. Soft palate rises symmetrically and there is no tongue deviation. Hearing is intact to conversational tone. Sensation: Sensation is intact to light touch throughout Motor: Strength is at least antigravity x4.  Movement examination: Tone: There is normal tone in the bilateral upper extremities.  The tone in the lower extremities is normal.  Abnormal movements: There is no tremor today Coordination:  There is no decremation, with any form of RAMS, including alternating supination and pronation of the forearm, hand  opening and closing, finger taps, heel taps and toe taps. Gait and Station: The patient has no difficulty arising out of a deep-seated chair without the use of the hands. The patient's stride length is good.       Cc:  Corliss Blacker, MD

## 2022-03-11 ENCOUNTER — Encounter: Payer: Self-pay | Admitting: Neurology

## 2022-03-11 ENCOUNTER — Ambulatory Visit: Payer: Medicare PPO | Admitting: Neurology

## 2022-03-11 VITALS — BP 142/80 | HR 63 | Ht 62.0 in | Wt 198.0 lb

## 2022-03-11 DIAGNOSIS — G20A1 Parkinson's disease without dyskinesia, without mention of fluctuations: Secondary | ICD-10-CM | POA: Diagnosis not present

## 2022-03-11 MED ORDER — CARBIDOPA-LEVODOPA 25-100 MG PO TABS: ORAL_TABLET | ORAL | 1 refills | Status: DC

## 2022-03-11 NOTE — Patient Instructions (Signed)
Local and Online Resources for Power over Parkinson's Group  December 2023    LOCAL [place] PARKINSON'S GROUPS   Power over Parkinson's Group:    Power Over Parkinson's Patient Education Group will be Wednesday, December 13th-*Hybrid meting*- in person at [facility] location and via WEBEX, 2:00-3:00 pm.   Starting in November, Power over Parkinson's and Care Partner Groups will meet together, with plans for separate break out session for caregivers (*this will be evolving over the next few months) Upcoming Power over Parkinson's Meetings/Care Partner Support:  2nd Wednesdays of the month at 2 pm:   December 13th, January 10th  Contact Amy Marriott at [email or web address] if interested in participating in this group    LOCAL EVENTS AND NEW OFFERINGS  Parkinson's Holiday Party!  Wednesday, December 6th, 4:00-5:00 pm.  [facility].  RSVP to Karen Simmers at [phone] or [email or web address] New PWR! Moves Community Fitness Instructor-Led Classes offering at [employer]!  TUESDAYS and Wednesdays 1-2 pm.   Contact Christy Weaver at  [email or web address]  or [phone] (Tuesday classes are modified for chair and standing only) Dance for Parkinson 's classes will be on Tuesdays 9:30am-10:30am starting October 3-December 12 with a break the week of November 21st. Located in the [facility], in the first floor of the [facility] ([address] To register:  [email or web address] or [phone]  Drumming for Parkinson's will be held on 2nd and 4th Mondays at 11:00 am.   Located at the [employer] ([address] [place]  Contact Jane Maydian at [email or web address] or [phone]  Through support from the Parkinson's Foundation, the Dance and Drumming for Parkinson's classes are free for both patients and caregivers.    [facility] Parkinson's Tai Chi Class, Mondays at  11 am.  Call [phone] for details   ONLINE EDUCATION AND SUPPORT  Parkinson Foundation:  www.parkinson.org  PD Health at Home continues:  Mindfulness Mondays, Wellness Wednesdays, Fitness Fridays   Upcoming Education:    Eating and Feeling Well through the Holidays. Wednesday, Dec. 6th,  1-2 pm  Hospital Safety.  Wednesday, Dec. 13th, 1-2 pm Register for expert briefings (webinars) at [email or web address].org/resources-support/online-[email or web address]/expert-[email or web address]-[email or web address]  Please check out their website to sign up for emails and see their full online offerings      [employer] Foundation:  www.michaeljfox.org   Third Thursday Webinars:  On the third Thursday of every month at 12 p.m. ET, join our free live webinars to learn about various aspects of living with Parkinson's disease and our work to speed medical breakthroughs.  Upcoming Webinar:  Tools for Diagnosing and Visualizing Parkinson's Disease.  Thursday, December 21st at 12 noon. Check out additional information on their website to see their full online offerings    Davis Phinney Foundation:  www.davisphinneyfoundation.org  Upcoming Webinar:   Stay tuned  Webinar Series:  Living with Parkinson's Meetup.   Third Thursdays each month, 3 pm  Care Partner Monthly Meetup.  With Connie Carpenter Phinney.  First Tuesday of each month, 2 pm  Check out additional information to Live Well Today on their website    Parkinson and Movement Disorders (PMD) Alliance:  www.pmdalliance.org  [employer]:  Online Education Events  Sign up for emails, which are sent weekly to give you updates on programming and online offerings    Parkinson's Association of [place]:  www.parkinsonassociation.org  Information on online support groups, education events, and online exercises including Yoga, Parkinson's exercises and more-LOTS of information on   links to PD resources and online events  Virtual Support Group through Parkinson's Association  of the [place]; next one is scheduled for Wednesday, December 6th  at 2 pm.  (These are typically scheduled for the 1st Wednesday of the month at 2 pm).  Visit website for details.   MOVEMENT AND EXERCISE OPPORTUNITIES  PWR! Moves Classes at [place].  Wednesdays 10 and 11 am.   Contact Amy Marriott, PT [email or web address] if interested.  NEW PWR! Moves Class offerings at [employer].  *TUESDAYS* and Wednesdays 1-2 pm.    Contact Vonna Kotyk at  [email or web address]    Parkinson's Wellness Recovery (PWR! Moves)  www.[email or web address]  Info on the PWR! Virtual Experience:  You will have access to our expertise?through self-assessment, guided plans that start with the PD-specific fundamentals, educational content, tips, Q&A with an expert, and a growing Art therapist of PD-specific pre-recorded and live exercise classes of varying types and intensity - both physical and cognitive! If that is not enough, we offer 1:1 wellness consultations (in-person or virtual) to personalize your PWR! Research scientist (medical).   [place] Fridays:   As part of the PD Health @ Home program, this free video series focuses each week on one aspect of fitness designed to support people living with Parkinson's.? These weekly videos highlight the [place] fitness guidelines for people with Parkinson's disease.  ModemGamers.[email or web address]   Dance for PD website is offering free, live-stream classes throughout the week, as well as links to [employer] of classes:  [email or web address].[email or web address]  Virtual dance and Pilates for Parkinson's classes: Click on the Community Tab> Parkinson's Movement Initiative Tab.  To register for classes and for more information, visit [email or web address] and click the "community" tab.   [employer] Parkinson's Cycling Classes   [facility]:  Thursdays @ Noon-Live classes at [employer] ([employer] [facility] [email or web address]?or [phone])  [facility]: Virtual Classes Mondays and Thursdays Jeanette Caprice classes Tuesday, Wednesday and Thursday (contact [place] at [email or web address] ?or [phone])  [place]  Varied levels of classes are offered Tuesdays and Thursdays at [employer].   Stretching with Verdis Frederickson weekly class is also offered for people with Parkinson's  To observe a class or for more information, call [phone] or email Hezzie Bump at [email or web address]   ADDITIONAL SUPPORT AND RESOURCES  [employer]:Online Caregiver Education Opportunities:  www.well-springsolutions.org/caregiver-education/caregiver-support-group.  You may also contact Vickki Muff at [email or web address] or [phone].     [employer] Navigator:  Just1Navigator program, a?free service to help individuals and families through the journey of determining care for older adults.  The "Navigator" is a Education officer, museum, Arnell Asal, who will speak with a prospective client and/or loved ones to provide an assessment of the situation and a set of recommendations for a personalized care plan -- all free of charge, and whether?[employer] offers the needed service or not. If the need is not a service we provide, we are well-connected with reputable programs in town that we can refer you to.  www.well-springsolutions.org or to speak with the Navigator, call [phone].

## 2022-03-16 NOTE — Addendum Note (Signed)
Addended by: Minus Breeding on: 03/16/2022 12:07 PM   Modules accepted: Orders

## 2022-03-17 ENCOUNTER — Ambulatory Visit: Payer: Medicare PPO | Attending: Cardiology

## 2022-03-17 DIAGNOSIS — R072 Precordial pain: Secondary | ICD-10-CM | POA: Diagnosis not present

## 2022-03-17 LAB — EXERCISE TOLERANCE TEST
Angina Index: 0
Duke Treadmill Score: 4
Estimated workload: 5.8
Exercise duration (min): 4 min
Exercise duration (sec): 4 s
MPHR: 143 {beats}/min
Peak HR: 123 {beats}/min
Percent HR: 86 %
RPE: 15
Rest HR: 69 {beats}/min
ST Depression (mm): 0 mm

## 2022-03-19 ENCOUNTER — Ambulatory Visit: Payer: Medicare PPO | Admitting: Neurology

## 2022-03-23 ENCOUNTER — Other Ambulatory Visit: Payer: Self-pay | Admitting: *Deleted

## 2022-03-23 DIAGNOSIS — R072 Precordial pain: Secondary | ICD-10-CM

## 2022-03-24 ENCOUNTER — Other Ambulatory Visit: Payer: Self-pay

## 2022-03-24 DIAGNOSIS — R072 Precordial pain: Secondary | ICD-10-CM

## 2022-03-24 DIAGNOSIS — Z01812 Encounter for preprocedural laboratory examination: Secondary | ICD-10-CM

## 2022-03-25 LAB — BASIC METABOLIC PANEL
BUN/Creatinine Ratio: 16 (ref 12–28)
BUN: 14 mg/dL (ref 8–27)
CO2: 23 mmol/L (ref 20–29)
Calcium: 9.5 mg/dL (ref 8.7–10.3)
Chloride: 102 mmol/L (ref 96–106)
Creatinine, Ser: 0.88 mg/dL (ref 0.57–1.00)
Glucose: 96 mg/dL (ref 70–99)
Potassium: 4.3 mmol/L (ref 3.5–5.2)
Sodium: 140 mmol/L (ref 134–144)
eGFR: 68 mL/min/{1.73_m2} (ref >59)

## 2022-03-26 ENCOUNTER — Telehealth (HOSPITAL_COMMUNITY): Payer: Self-pay | Admitting: *Deleted

## 2022-03-26 ENCOUNTER — Encounter: Payer: Self-pay | Admitting: *Deleted

## 2022-03-26 MED ORDER — METOPROLOL TARTRATE 50 MG PO TABS: ORAL_TABLET | ORAL | 0 refills | Status: DC

## 2022-03-26 NOTE — Telephone Encounter (Signed)
Reaching out to patient to offer assistance regarding upcoming cardiac imaging study; pt verbalizes understanding of appt date/time, parking situation and where to check in, pre-test NPO status and medications ordered, and verified current allergies; name and call back number provided for further questions should they arise  Gordy Clement RN Navigator Cardiac Imaging [facility] [phone] office [phone] cell  Patient to take '50mg'$  metoprolol tartrate two hours prior to her cardiac CT scan. She is aware to arrive at 12pm.

## 2022-03-29 ENCOUNTER — Ambulatory Visit (HOSPITAL_COMMUNITY)
Admission: RE | Admit: 2022-03-29 | Discharge: 2022-03-29 | Disposition: A | Discharge status: Home / Self Care | Payer: Medicare PPO | Source: Ambulatory Visit | Attending: Cardiology | Admitting: Cardiology

## 2022-03-29 DIAGNOSIS — R072 Precordial pain: Secondary | ICD-10-CM | POA: Insufficient documentation

## 2022-03-29 MED ORDER — NITROGLYCERIN 0.4 MG SL SUBL
0.8000 mg | SUBLINGUAL_TABLET | Freq: Once | SUBLINGUAL | Status: AC
  Administered 2022-03-29: 0.8 mg via SUBLINGUAL

## 2022-03-29 MED ORDER — IOHEXOL 350 MG/ML SOLN
95.0000 mL | Freq: Once | INTRAVENOUS | Status: AC | PRN
  Administered 2022-03-29: 95 mL via INTRAVENOUS

## 2022-03-29 MED ORDER — NITROGLYCERIN 0.4 MG SL SUBL
SUBLINGUAL_TABLET | SUBLINGUAL | Status: AC
  Filled 2022-03-29: qty 2

## 2022-04-01 ENCOUNTER — Other Ambulatory Visit: Payer: Self-pay | Admitting: Gastroenterology

## 2022-04-05 ENCOUNTER — Ambulatory Visit: Payer: Medicare PPO | Admitting: Gastroenterology

## 2022-04-05 ENCOUNTER — Encounter: Payer: Self-pay | Admitting: Gastroenterology

## 2022-04-05 ENCOUNTER — Encounter: Payer: Self-pay | Admitting: *Deleted

## 2022-04-05 VITALS — BP 142/86 | HR 70 | Ht 62.0 in | Wt 200.0 lb

## 2022-04-05 DIAGNOSIS — K219 Gastro-esophageal reflux disease without esophagitis: Secondary | ICD-10-CM | POA: Diagnosis not present

## 2022-04-05 DIAGNOSIS — M6289 Other specified disorders of muscle: Secondary | ICD-10-CM

## 2022-04-05 DIAGNOSIS — K588 Other irritable bowel syndrome: Secondary | ICD-10-CM

## 2022-04-05 MED ORDER — OMEPRAZOLE 20 MG PO CPDR: 20.0000 mg | DELAYED_RELEASE_CAPSULE | Freq: Every day | ORAL | 3 refills | Status: DC

## 2022-04-05 NOTE — Progress Notes (Signed)
Sally Diaz    [id number]    [birth year]  Primary Care Physician:Merino Dan Europe, MD  Referring Physician: Delana Meyer, [address], [address] [place],  [place] [address]   Chief complaint:  GERD  HPI:  71 yr very pleasant female here for follow up visit for GERD.   She is taking omeprazole 40 mg daily and using FD guard as needed for breakthrough dyspepsia symptoms Overall her symptoms are stable Denies any dysphagia, nausea, vomiting, melena or rectal bleeding  Colonoscopy 05/20/2020 Impression:        - Moderate diverticulosis in the sigmoid colon, in                            the descending colon and in the ascending colon.                            There was evidence of an impacted diverticulum.                           - Non-bleeding external and internal hemorrhoids.   EGD 03/07/2019: Small hiatal hernia otherwise normal   EGD December 27, 2018: LA grade C esophagitis and GE junction nodularity, biopsies showed ulcerative esophagitis, negative for CMV, HSV and yeast infection. Hiatal hernia and mild gastritis, biopsy showed mild chronic gastritis, negative for H. Pylori   EGD  06/04/2005 by Dr. Olevia Perches: Reflux esophagitis, biopsies negative for intestinal metaplasia   Colonoscopy 06/07/2016 Left-sided colon diverticulosis, 11 mm polyp ascending colon [tubular adenoma] and 4 mm hyperplastic polyp transverse colon removed   Outpatient Encounter Medications as of 04/05/2022  Medication Sig   Caraway Oil-Levomenthol (FDGARD PO) Take 1 tablet by mouth daily.   carbidopa-levodopa (SINEMET IR) 25-100 MG tablet 1 at 7am/11am/4pm   celecoxib (CELEBREX) 200 MG capsule Take 200 mg by mouth 2 (two) times daily as needed.   Cholecalciferol (VITAMIN D3) 5000 units TBDP Take 1 tablet by mouth daily.   clobetasol ointment (TEMOVATE) 0.05 % as needed.    Co-Enzyme Q-10 30 MG CAPS Take 100 mg by mouth.   diclofenac Sodium (VOLTAREN) 1 % GEL as  needed.   fish oil-omega-3 fatty acids 1000 MG capsule Take 2 capsules by mouth daily. 1 daily   Golimumab ([place] ARIA IV) Inject into the vein. Every 8 weeks   hydroxychloroquine (PLAQUENIL) 200 MG tablet TAKE 2 TABLETS MONDAY THROUGH FRIDAY BUT NONE ON SATURDAY OR SUNDAY. (Patient taking differently: Take 400 mg by mouth 2 (two) times daily. Every day even weekends)   irbesartan (AVAPRO) 75 MG tablet Take 300 mg by mouth daily. MD recommendation to go up to 300 mg   LIOTHYRONINE SODIUM PO Take 1 tablet by mouth daily. T4, T3 compounded medication   Nutritional Supplements (DHEA) 15-50 MG CAPS Take 15 mg by mouth daily.   omeprazole (PRILOSEC) 40 MG capsule TAKE (1) CAPSULE DAILY.   pravastatin (PRAVACHOL) 40 MG tablet 1 tablet   sucralfate (CARAFATE) 1 g tablet Take 1 tablet (1 g total) by mouth 2 (two) times daily. (Patient taking differently: Take 1 g by mouth as needed.)   vitamin E 400 UNIT capsule Take by mouth.   tiZANidine (ZANAFLEX) 2 MG tablet Take by mouth as needed. (Patient not taking: Reported on 03/11/2022)   [DISCONTINUED] metoprolol tartrate (LOPRESSOR) 50  MG tablet Take tablet TWO hours prior to your cardiac CT scan.   Facility-Administered Encounter Medications as of 04/05/2022  Medication   0.9 %  sodium chloride infusion    Allergies as of 04/05/2022 - Review Complete 04/05/2022  Allergen Reaction Noted   Latex Rash 06/07/2016   Methotrexate Rash 08/17/2016   Methotrexate derivatives Rash 08/17/2016   Neomycin-bacitracin zn-polymyx Rash    Tetracycline Swelling     Past Medical History:  Diagnosis Date   Allergy, unspecified not elsewhere classified    Anemia, iron deficiency    Arthritis    Asthma    Basal cell carcinoma    Diverticulosis of colon    GERD (gastroesophageal reflux disease)    Hx of adenomatous colonic polyps    Hypercholesterolemia    Hypertension    Hypothyroidism    Obesity    Osteoporosis    Parkinson's disease 2023   Reflux  esophagitis    Rheumatoid arthritis ([place])     Past Surgical History:  Procedure Laterality Date   CATARACT EXTRACTION, BILATERAL     CERVICAL CONE BIOPSY     COLONOSCOPY     ENDOMETRIAL ABLATION     TONSILLECTOMY     UPPER GASTROINTESTINAL ENDOSCOPY      Family History  Problem Relation Age of Onset   Heart disease Mother    Bladder Cancer Mother    Kidney cancer Mother    Stroke Father    Pneumonia Father    Parkinson's disease Father    Lung cancer Brother    AAA (abdominal aortic aneurysm) Brother    Diabetes Maternal Grandmother    Aneurysm Maternal Grandmother        brain   Colon cancer Cousin        Maternal Side   Esophageal cancer Neg Hx    Stomach cancer Neg Hx    Rectal cancer Neg Hx     Social History   Socioeconomic History   Marital status: Married    Spouse name: Not on file   Number of children: Not on file   Years of education: Not on file   Highest education level: Not on file  Occupational History   Occupation: Retired    Comment: Tourist information centre manager  Tobacco Use   Smoking status: Never   Smokeless tobacco: Never  Vaping Use   Vaping Use: Never used  Substance and Sexual Activity   Alcohol use: No   Drug use: No   Sexual activity: Not on file  Other Topics Concern   Not on file  Social History Narrative   Right handed    Lives at home with husband   Social Determinants of Health   Financial Resource Strain: Not on file  Food Insecurity: Not on file  Transportation Needs: Not on file  Physical Activity: Not on file  Stress: Not on file  Social Connections: Not on file  Intimate Partner Violence: Not on file      Review of systems: All other review of systems negative except as mentioned in the HPI.   Physical Exam: Vitals:   04/05/22 0824  BP: (Abnormal) 142/86  Pulse: 70  SpO2: 99%   Body mass index is 36.58 kg/m. Gen:      No acute distress HEENT:  sclera anicteric Abd:      soft, non-tender; no  palpable masses, no distension Ext:    No edema Neuro: alert and oriented x 3 Psych: normal mood and affect  Data Reviewed:  Reviewed labs, radiology imaging, old records and pertinent past GI work up   Assessment and Plan/Recommendations:  41 yr very pleasant female here for follow up of GERD   GERD: History of erosive esophagitis.  Currently stable symptoms on daily PPI with occasional breakthrough Continue omeprazole 40 mg daily Use Carafate 1 g twice daily as needed   Dyspepsia: Use FD guard as needed   She is up-to-date with surveillance colonoscopy, no recall  Use Benefiber 1 tablespoon twice daily with meals and increase water intake to improve bowel habits   Return in 6 months or sooner if needed  This visit required 40 minutes of patient care (this includes precharting, chart review, review of results, face-to-face time used for counseling as well as treatment plan and follow-up. The patient was provided an opportunity to ask questions and all were answered. The patient agreed with the plan and demonstrated an understanding of the instructions.  Damaris Hippo , MD    CC: Layla Barter, *

## 2022-04-05 NOTE — Patient Instructions (Addendum)
_______________________________________________________  If your blood pressure at your visit was 140/90 or greater, please contact your primary care physician to follow up on this.  _______________________________________________________  If you are age 78 or older, your body mass index should be between 23-30. Your Body mass index is 36.58 kg/m. If this is out of the aforementioned range listed, please consider follow up with your Primary Care Provider.  If you are age 27 or younger, your body mass index should be between 19-25. Your Body mass index is 36.58 kg/m. If this is out of the aformentioned range listed, please consider follow up with your Primary Care Provider.   ________________________________________________________  The [place] [facility] providers would like to encourage you to use [facility] to communicate with providers for non-urgent requests or questions.  Due to long hold times on the telephone, sending your provider a message by [facility] may be a faster and more efficient way to get a response.  Please allow 48 business hours for a response.  Please remember that this is for non-urgent requests.  _______________________________________________________   Take Benefiber 1 tablespoon three times a day with meals  Gastroesophageal Reflux Disease, Adult Gastroesophageal reflux (GER) happens when acid from the stomach flows up into the tube that connects the mouth and the stomach (esophagus). Normally, food travels down the esophagus and stays in the stomach to be digested. However, when a person has GER, food and stomach acid sometimes move back up into the esophagus. If this becomes a more serious problem, the person may be diagnosed with a disease called gastroesophageal reflux disease (GERD). GERD occurs when the reflux: Happens often. Causes frequent or severe symptoms. Causes problems such as damage to the esophagus. When stomach acid comes in contact with the esophagus, the acid  may cause inflammation in the esophagus. Over time, GERD may create small holes (ulcers) in the lining of the esophagus. What are the causes? This condition is caused by a problem with the muscle between the esophagus and the stomach (lower esophageal sphincter, or LES). Normally, the LES muscle closes after food passes through the esophagus to the stomach. When the LES is weakened or abnormal, it does not close properly, and that allows food and stomach acid to go back up into the esophagus. The LES can be weakened by certain dietary substances, medicines, and medical conditions, including: Tobacco use. Pregnancy. Having a hiatal hernia. Alcohol use. Certain foods and beverages, such as coffee, chocolate, onions, and peppermint. What increases the risk? You are more likely to develop this condition if you: Have an increased body weight. Have a connective tissue disorder. Take NSAIDs, such as ibuprofen. What are the signs or symptoms? Symptoms of this condition include: Heartburn. Difficult or painful swallowing and the feeling of having a lump in the throat. A bitter taste in the mouth. Bad breath and having a large amount of saliva. Having an upset or bloated stomach and belching. Chest pain. Different conditions can cause chest pain. Make sure you see your health care provider if you experience chest pain. Shortness of breath or wheezing. Ongoing (chronic) cough or a nighttime cough. Wearing away of tooth enamel. Weight loss. How is this diagnosed? This condition may be diagnosed based on a medical history and a physical exam. To determine if you have mild or severe GERD, your health care provider may also monitor how you respond to treatment. You may also have tests, including: A test to examine your stomach and esophagus with a small camera (endoscopy). A test  that measures the acidity level in your esophagus. A test that measures how much pressure is on your esophagus. A barium  swallow or modified barium swallow test to show the shape, size, and functioning of your esophagus. How is this treated? Treatment for this condition may vary depending on how severe your symptoms are. Your health care provider may recommend: Changes to your diet. Medicine. Surgery. The goal of treatment is to help relieve your symptoms and to prevent complications. Follow these instructions at home: Eating and drinking  Follow a diet as recommended by your health care provider. This may involve avoiding foods and drinks such as: Coffee and tea, with or without caffeine. Drinks that contain alcohol. Energy drinks and sports drinks. Carbonated drinks or sodas. Chocolate and cocoa. Peppermint and mint flavorings. Garlic and onions. Horseradish. Spicy and acidic foods, including peppers, chili powder, curry powder, vinegar, hot sauces, and barbecue sauce. Citrus fruit juices and citrus fruits, such as oranges, lemons, and limes. Tomato-based foods, such as red sauce, chili, salsa, and pizza with red sauce. Fried and fatty foods, such as donuts, french fries, potato chips, and high-fat dressings. High-fat meats, such as hot dogs and fatty cuts of red and white meats, such as rib eye steak, sausage, ham, and bacon. High-fat dairy items, such as whole milk, butter, and cream cheese. Eat small, frequent meals instead of large meals. Avoid drinking large amounts of liquid with your meals. Avoid eating meals during the 2-3 hours before bedtime. Avoid lying down right after you eat. Do not exercise right after you eat. Lifestyle  Do not use any products that contain nicotine or tobacco. These products include cigarettes, chewing tobacco, and vaping devices, such as e-cigarettes. If you need help quitting, ask your health care provider. Try to reduce your stress by using methods such as yoga or meditation. If you need help reducing stress, ask your health care provider. If you are overweight,  reduce your weight to an amount that is healthy for you. Ask your health care provider for guidance about a safe weight loss goal. General instructions Pay attention to any changes in your symptoms. Take over-the-counter and prescription medicines only as told by your health care provider. Do not take aspirin, ibuprofen, or other NSAIDs unless your health care provider told you to take these medicines. Wear loose-fitting clothing. Do not wear anything tight around your waist that causes pressure on your abdomen. Raise (elevate) the head of your bed about 6 inches (15 cm). You can use a wedge to do this. Avoid bending over if this makes your symptoms worse. Keep all follow-up visits. This is important. Contact a health care provider if: You have: New symptoms. Unexplained weight loss. Difficulty swallowing or it hurts to swallow. Wheezing or a persistent cough. A hoarse voice. Your symptoms do not improve with treatment. Get help right away if: You have sudden pain in your arms, neck, jaw, teeth, or back. You suddenly feel sweaty, dizzy, or light-headed. You have chest pain or shortness of breath. You vomit and the vomit is green, yellow, or black, or it looks like blood or coffee grounds. You faint. You have stool that is red, bloody, or black. You cannot swallow, drink, or eat. These symptoms may represent a serious problem that is an emergency. Do not wait to see if the symptoms will go away. Get medical help right away. Call your local emergency services (911 in the [place] Do not drive yourself to the hospital. Summary Gastroesophageal reflux happens  when acid from the stomach flows up into the esophagus. GERD is a disease in which the reflux happens often, causes frequent or severe symptoms, or causes problems such as damage to the esophagus. Treatment for this condition may vary depending on how severe your symptoms are. Your health care provider may recommend diet and lifestyle  changes, medicine, or surgery. Contact a health care provider if you have new or worsening symptoms. Take over-the-counter and prescription medicines only as told by your health care provider. Do not take aspirin, ibuprofen, or other NSAIDs unless your health care provider told you to do so. Keep all follow-up visits as told by your health care provider. This is important. This information is not intended to replace advice given to you by your health care provider. Make sure you discuss any questions you have with your health care provider. Document Revised: 09/17/2019 Document Reviewed: 09/17/2019 Elsevier Patient Education  [place].  Kegel Exercises  Kegel exercises can help strengthen your pelvic floor muscles. The pelvic floor is a group of muscles that support your rectum, small intestine, and bladder. In females, pelvic floor muscles also help support the uterus. These muscles help you control the flow of urine and stool (feces). Kegel exercises are painless and simple. They do not require any equipment. Your provider may suggest Kegel exercises to: Improve bladder and bowel control. Improve sexual response. Improve weak pelvic floor muscles after surgery to remove the uterus (hysterectomy) or after pregnancy, in females. Improve weak pelvic floor muscles after prostate gland removal or surgery, in males. Kegel exercises involve squeezing your pelvic floor muscles. These are the same muscles you squeeze when you try to stop the flow of urine or keep from passing gas. The exercises can be done while sitting, standing, or lying down, but it is best to vary your position. Ask your health care provider which exercises are safe for you. Do exercises exactly as told by your health care provider and adjust them as directed. Do not begin these exercises until told by your health care provider. Exercises How to do Kegel exercises: Squeeze your pelvic floor muscles tight. You should feel a  tight lift in your rectal area. If you are a female, you should also feel a tightness in your vaginal area. Keep your stomach, buttocks, and legs relaxed. Hold the muscles tight for up to 10 seconds. Breathe normally. Relax your muscles for up to 10 seconds. Repeat as told by your health care provider. Repeat this exercise daily as told by your health care provider. Continue to do this exercise for at least 4-6 weeks, or for as long as told by your health care provider. You may be referred to a physical therapist who can help you learn more about how to do Kegel exercises. Depending on your condition, your health care provider may recommend: Varying how long you squeeze your muscles. Doing several sets of exercises every day. Doing exercises for several weeks. Making Kegel exercises a part of your regular exercise routine. This information is not intended to replace advice given to you by your health care provider. Make sure you discuss any questions you have with your health care provider. Document Revised: 07/17/2020 Document Reviewed: 07/17/2020 Elsevier Patient Education  [place] samples   Follow up in 6 months   We have sent the following medications to your pharmacy for you to pick up at your convenience:  Omeprazole     Thank you for choosing  [facility]  Karleen Hampshire Nandigam,MD

## 2022-04-16 NOTE — Progress Notes (Unsigned)
HPI Sally Diaz never smoker followed for Chronic Cough, complicated by HTN, Allergic Rhinitis, Asthma, GERD, Diverticulosis, Hypothyroid, Parkinson's, Rheumatoid Arthritis,Degenerative Disc Disease,  Obesity, Hypercholesterolemia, Osteoarthritis, Aortic Aneurysm,  ===============================================================   02/16/22- 76 yoF never smoker followed for Chronic Cough, complicated by HTN, Allergic Rhinitis, Asthma, GERD, Diverticulosis, Hypothyroid, Parkinson's, Rheumatoid Arthritis,Degenerative Disc Disease,  Obesity, Hypercholesterolemia, Osteoarthritis, -loratadine,  albuterol hfa, omeprazole,  Covid vax-4 Phizer Flu vax- had -----Pt states symptoms of SOB, coughing, and wheezing have gotten worse since last visit Similar experience in past, where increased cough and wheeze were associated with exposure to leaf dust and old house. Increased postnasal drip would aggravate reflux. Coming down from [place] recently she had substernal pressure pain relieved by belching. Milder, similar pain a day or two later. She continues omeprazole and denies reflux or heart burn. Rescue inhaler gives modest help. Wheeze interferes with sleep. Scant white phlegm. No fever. She asks referral to cardiology for chest pain and labile hypertension- has been seen before for this. CXR 02/01/22- MPRESSION: No acute disease.  Aortic Atherosclerosis (ICD10-I70.0).  04/19/22-  76 yoF never smoker followed for Chronic Cough, complicated by HTN, Allergic Rhinitis, Asthma, GERD, Diverticulosis, Hypothyroid, Parkinson's, Rheumatoid Arthritis,Degenerative Disc Disease,  Obesity, Hypercholesterolemia, Osteoarthritis, Aortic Aneurysm,  -loratadine,  albuterol hfa, omeprazole,  Covid vax-4 Phizer Flu vax- had  Has seen GI for GERD. Working with Neurology for [employer]. Says she is doing well with asthmatic bronchitis/ cough. Has only rescue inhaler- rarely used. Trelegy sample ok but not needed. Learned to wear  mask if raking leaves.    ROS-see HPI Constitutional:   No-   weight loss, night sweats, fevers, chills, fatigue, lassitude. HEENT:   + headaches, difficulty swallowing, tooth/dental problems, sore throat,       No-  sneezing, itching, ear ache, +nasal congestion, post nasal drip,  CV:  No-   chest pain, orthopnea, PND, swelling in lower extremities, anasarca,                                                         dizziness, palpitations Resp: No-   shortness of breath with exertion or at rest.              No-productive cough,  + non-productive cough,  No- coughing up of blood.              No-   change in color of mucus.  +-wheezing.   Skin: No-   rash or lesions. GI:  + heartburn, indigestion, abdominal pain, nausea, vomiting, GU:  MS:  No-   joint pain or swelling.   Neuro-     nothing unusual Psych:  No- change in mood or affect. No depression or anxiety.  No memory loss.  OBJ- Physical Exam General- Alert, Oriented, Affect-appropriate, Distress- none acute+overweight Skin- rash-none, lesions- none, excoriation- none Lymphadenopathy- none Head- atraumatic            Eyes- Gross vision intact, PERRLA, conjunctivae and secretions clear            Ears- Hearing, canals-normal            Nose- n, no-Septal dev, mucus, polyps, erosion, perforation ,             Throat- Mallampati II , mucosared , drainage- none, tonsils- atrophic. +hoarse Neck- flexible ,  trachea midline, no stridor , thyroid nl, carotid no bruit Chest - symmetrical excursion , unlabored           Heart/CV- RRR , no murmur , no gallop  , no rub, nl s1 s2                           - JVD- none , edema- none, stasis changes- none, varices- none           Lung-  +wheeze at end exhalation, cough with laughter , dullness-none, rub- none           Chest wall-  Abd-  Br/ Gen/ Rectal- Not done, not indicated Extrem- cyanosis- none, clubbing, none, atrophy- none, strength- nl Neuro- +tremor

## 2022-04-19 ENCOUNTER — Encounter: Payer: Self-pay | Admitting: Internal Medicine

## 2022-04-19 ENCOUNTER — Ambulatory Visit: Payer: Medicare PPO | Admitting: Internal Medicine

## 2022-04-19 VITALS — BP 138/76 | HR 84 | Temp 97.9°F | Ht 62.0 in | Wt 195.2 lb

## 2022-04-19 DIAGNOSIS — J302 Other seasonal allergic rhinitis: Secondary | ICD-10-CM | POA: Diagnosis not present

## 2022-04-19 DIAGNOSIS — J3089 Other allergic rhinitis: Secondary | ICD-10-CM | POA: Diagnosis not present

## 2022-04-19 DIAGNOSIS — J4520 Mild intermittent asthma, uncomplicated: Secondary | ICD-10-CM

## 2022-04-19 NOTE — Assessment & Plan Note (Signed)
Mild intermittent uncomplicated Plan- keep rescue inhaler

## 2022-04-19 NOTE — Assessment & Plan Note (Signed)
Currently minimal in advance of Spring pollen season

## 2022-04-19 NOTE — Patient Instructions (Signed)
Glad you are doing well. Please let us know if you need tthe albuterol rescue inhaler refilled or if we can do anything to help you.

## 2022-04-22 ENCOUNTER — Encounter: Payer: Self-pay | Admitting: Gastroenterology

## 2022-07-01 ENCOUNTER — Ambulatory Visit: Payer: Medicare PPO

## 2022-07-01 ENCOUNTER — Ambulatory Visit: Payer: Medicare PPO | Attending: Neurology | Admitting: Physical Therapy

## 2022-07-01 ENCOUNTER — Ambulatory Visit: Payer: Medicare PPO | Admitting: Occupational Therapy

## 2022-07-01 DIAGNOSIS — R2689 Other abnormalities of gait and mobility: Secondary | ICD-10-CM | POA: Insufficient documentation

## 2022-07-01 DIAGNOSIS — R29818 Other symptoms and signs involving the nervous system: Secondary | ICD-10-CM

## 2022-07-01 DIAGNOSIS — R471 Dysarthria and anarthria: Secondary | ICD-10-CM | POA: Insufficient documentation

## 2022-07-01 NOTE — Therapy (Signed)
[place] [facility] [address], [address] [place], [place], [address] Phone: [phone]   Fax:  [phone]  Patient Details  Name: Sally Diaz MRN: [id number] Date of Birth: 1945-08-15 Referring Provider:  Kerin Salen, DO  Encounter Date: 07/01/2022 Speech Therapy Parkinson's Disease Screen   Decibel Level today: low 70s dB  (WNL=70-72 dB) with sound level meter 30cm away from pt's masked mouth. Pt's conversational volume has remained unchanged since last screen. People tell me to quiet down," pt stated.  Pt does not not report difficulty with swallowing, which does not warrant further evaluation  Pt does does not require speech therapy services at this time. Recommend ST screen in another 6-8 months   Meosha Castanon, CCC-SLP 07/01/2022, 12:13 PM  [place] [facility] [address], [address] [place], [place], [address] Phone: [phone]   Fax:  [phone]

## 2022-07-01 NOTE — Therapy (Signed)
[place] [facility] [address] [place], [place], [address] Phone: [phone]   Fax:  [phone]  Patient Details  Name: Sally Diaz MRN: [id number] Date of Birth: 1945-07-12 Referring Provider:  Worthy Rancher, MD  Encounter Date: 07/01/2022  Occupational Therapy Parkinson's Disease Screen  Hand dominance:  right   Physical Performance Test item #2 (simulated eating):  13.0 sec  Fastening/unfastening 3 buttons in:  24.22 sec  9-hole peg test:    RUE  26.38 sec        LUE  24.03 sec  Box & Blocks Test:   RUE  50 blocks        LUE  57 blocks  Other Comments:  reports sometimes handwriting is not as fluid as it once was, but overall no concerns.  Did discuss continued engagement in [employer]! [employer]oves program and focus on quality of movement with with functional tasks, as noticing some tremors in R with box and blocks, hitting barrier 3-4x.  Pt does not require occupational therapy services at this time.  Recommended occupational therapy screen in   6-9 months   [place], [place], OT 07/01/2022, 11:53 AM  [place] [facility] [address], [address] [place], [place], [address] Phone: [phone]   Fax:  [phone]

## 2022-07-01 NOTE — Therapy (Signed)
[place] [facility] [address], [address] [place], [place], [address] Phone: [phone]   Fax:  [phone]  Patient Details  Name: Sally Diaz MRN: [id number] Date of Birth: 1945-08-21 Referring Provider:  Worthy Rancher, MD  Encounter Date: 07/01/2022  Physical Therapy Parkinson's Disease Screen   Timed Up and Go test: 9.27 sec (compared to 10.25 sec)  10 meter walk test:7.5 sec = 4.37 ft/sec (compared to 3.36 ft/sec)  5 time sit to stand test:8 sec (compared to 7.59 sec)   Patient does not require Physical Therapy services at this time.  Recommend Physical Therapy screen in 6-9 months.  No falls.  Doing PWR! Moves at home.     Arsenio Schnorr W., PT 07/01/2022, 11:55 AM  [place]  [facility] [address], [address] [place], [place], [address] Phone: [phone]   Fax:  [phone]

## 2022-07-30 ENCOUNTER — Telehealth: Payer: Self-pay | Admitting: Gastroenterology

## 2022-07-30 NOTE — Telephone Encounter (Signed)
Patient called requesting a call back to discuss her omeprazole medication. She would like to discuss if there is an alternative she could take. Please advise, thank you.

## 2022-08-02 NOTE — Telephone Encounter (Signed)
Patient has "osteoporosis" and her doctor told her the omeprazole is to blame. She does not know what to do. She had tried decreasing the dosage to omeprazole 20 mg previously and had become symptomatic with dyspepsia. She returned to the 40 mg dosage.  Patient is asking for recommendations on changing medication.  Follow appointment was previously scheduled.

## 2022-08-02 NOTE — Telephone Encounter (Signed)
Patient called regarding previous message, requesting a call back. Please advise, thank you.

## 2022-08-02 NOTE — Telephone Encounter (Signed)
All acid reducers have same potential side effects. We have to weigh in benefits vs risk, given her current symptoms continue current dosage of Omeprazole 40mg  daily and I will discuss with her at follow up visit further management. Thanks

## 2022-08-02 NOTE — Telephone Encounter (Signed)
Called the patient back. No answer. Left a message of my call. 

## 2022-08-03 NOTE — Telephone Encounter (Signed)
Patient advised.

## 2022-08-17 IMAGING — NM NM DATSCAN
2 series · 12 of 12 positions shown · non-contrast
Comparison: Brain MRI 02 07 21

CLINICAL DATA: 75-year-old female with bilateral hand tremors,
RIGHT greater than LEFT.

EXAM:
NUCLEAR MEDICINE BRAIN IMAGING WITH SPECT  (DaTscan )
TECHNIQUE: SPECT images of the brain were obtained after intravenous injection
of radiopharmaceutical. 4 hour post injection imaging. Appropriate
positioning.
130 mg RIAZ STAT given orally for thyroid blockade.
RADIOPHARMACEUTICALS:  5.1 millicuries I 123 Ioflupane

[Series 1: spect - 123 iodine_(id)_cor · 4.1mm · 4.14mm/px · 6 of 128 frames shown]
[frame 11/128]
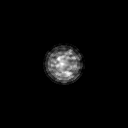
[frame 32/128]
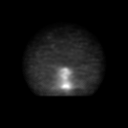
[frame 54/128]
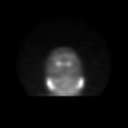
[frame 75/128]
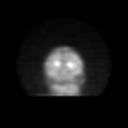
[frame 96/128]
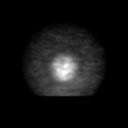
[frame 118/128]
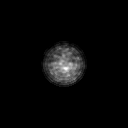

[Series 1: spect - 123 iodine_(id)_tra · 4.1mm · 4.14mm/px · 6 of 128 frames shown]
[frame 11/128]
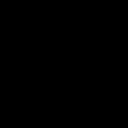
[frame 32/128]
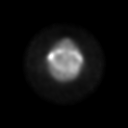
[frame 54/128]
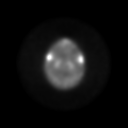
[frame 75/128]
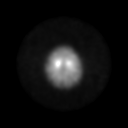
[frame 96/128]
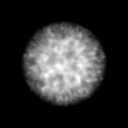
[frame 118/128]
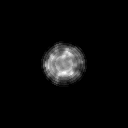

[12 of 12 positions shown; findings below may reference images not displayed]

FINDINGS: Decreased relative radiotracer activity in the posterior LEFT
putamen. Normal activity in the head of the LEFT caudate nucleus.
Normal relative activity in the head of the RIGHT caudate nucleus
and RIGHT putamen.
IMPRESSION: Asymmetric decreased radiotracer activity in the LEFT putamen.
Recommend clinical correlation for Parkinsonian asyndrome pathology.

Of note, DaTSCAN is not diagnostic of Parkinsonian syndromes, which
remains a clinical diagnosis. DaTscan is an adjuvant test to aid in
the clinical diagnosis of Parkinsonian syndromes.

## 2022-09-13 NOTE — Progress Notes (Unsigned)
Assessment/Plan:   1.  ET/PD  -DaTscan correlates with symptoms.  DaTscan demonstrated decreased activity in the left putamen.  -Patient's father diagnosed with Parkinson's disease in his later life as well.  Discussed Parkinsons Disease GENEration study via PF.  Discussed this today again as she is more interested in it now.  Showed her how to sign up  -Continue carbidopa/levodopa 1 po tid -discussed levodopa and protein.  -We discussed that it used to be thought that levodopa would increase risk of melanoma but now it is believed that Parkinsons itself likely increases risk of melanoma. she is to get regular skin checks.  She is doing that.  Next skin check was in sept 2024 -exercise    Subjective:   Sally Diaz was seen today in follow up for ET/PD. This patient is accompanied in the office by her spouse who supplements the history.  Pt denies falls.  She had therapy screenings in April and did not require therapy at the time.  Current movement disorder medications: Carbidopa/levodopa 25/100, 1 tablet 3 times per day  ALLERGIES:   Allergies  Allergen Reactions   Latex Rash   Methotrexate Rash   Methotrexate Derivatives Rash   Neomycin-Bacitracin Zn-Polymyx Rash   Tetracycline Swelling    CURRENT MEDICATIONS:  Current Meds  Medication Sig   Caraway Oil-Levomenthol (FDGARD PO) Take 1 tablet by mouth daily.   carbidopa-levodopa (SINEMET IR) 25-100 MG tablet 1 at 7am/11am/4pm   celecoxib (CELEBREX) 200 MG capsule Take 200 mg by mouth 2 (two) times daily as needed.   Cholecalciferol (VITAMIN D3) 5000 units TBDP Take 1 tablet by mouth daily.   clobetasol ointment (TEMOVATE) 0.05 % as needed.    Co-Enzyme Q-10 30 MG CAPS Take 100 mg by mouth.   diclofenac Sodium (VOLTAREN) 1 % GEL as needed.   fish oil-omega-3 fatty acids 1000 MG capsule Take 2 capsules by mouth daily. 1 daily   Golimumab (SIMPONI ARIA IV) Inject into the vein. Every 8 weeks   hydroxychloroquine  (PLAQUENIL) 200 MG tablet TAKE 2 TABLETS MONDAY THROUGH FRIDAY BUT NONE ON SATURDAY OR SUNDAY. (Patient taking differently: Take 400 mg by mouth 2 (two) times daily. Every day even weekends)   irbesartan (AVAPRO) 75 MG tablet Take 300 mg by mouth daily. MD recommendation to go up to 300 mg   LIOTHYRONINE SODIUM PO Take 1 tablet by mouth daily. T4, T3 compounded medication   Nutritional Supplements (DHEA) 15-50 MG CAPS Take 15 mg by mouth daily.   omeprazole (PRILOSEC) 20 MG capsule Take 1 capsule (20 mg total) by mouth daily. Take 30 minutes before lunch   pravastatin (PRAVACHOL) 40 MG tablet 1 tablet   tiZANidine (ZANAFLEX) 2 MG tablet Take by mouth as needed.   vitamin E 400 UNIT capsule Take by mouth.   Current Facility-Administered Medications for the 09/14/22 encounter (Office Visit) with Bethania Schlotzhauer, Octaviano Batty, DO  Medication   0.9 %  sodium chloride infusion     Objective:   PHYSICAL EXAMINATION:    VITALS:   Vitals:   09/14/22 1045  BP: 131/86  Pulse: 71  SpO2: 96%  Weight: 193 lb 6.4 oz (87.7 kg)  Height: 5\' 2"  (1.575 m)     GEN:  The patient appears stated age and is in NAD. HEENT:  Normocephalic, atraumatic.  The mucous membranes are moist. The superficial temporal arteries are without ropiness or tenderness. CV:  rrr Lungs:  CTAB   Neurological examination:  Orientation: The patient is  alert and oriented x3. Cranial nerves: There is good facial symmetry without facial hypomimia. The speech is fluent and clear. Soft palate rises symmetrically and there is no tongue deviation. Hearing is intact to conversational tone. Sensation: Sensation is intact to light touch throughout Motor: Strength is at least antigravity x4.  Movement examination: Tone: There is normal tone in the bilateral upper extremities.  The tone in the lower extremities is normal.  Abnormal movements: There is no tremor today Coordination:  There is no decremation, with any form of RAMS, including  alternating supination and pronation of the forearm, hand opening and closing, finger taps, heel taps and toe taps. Gait and Station: The patient has no difficulty arising out of a deep-seated chair without the use of the hands. The patient's stride length is good.      Cc:  Corliss Blacker, MD

## 2022-09-14 ENCOUNTER — Encounter: Payer: Self-pay | Admitting: Neurology

## 2022-09-14 ENCOUNTER — Ambulatory Visit: Payer: Medicare PPO | Admitting: Neurology

## 2022-09-14 VITALS — BP 131/86 | HR 71 | Ht 62.0 in | Wt 193.4 lb

## 2022-09-14 DIAGNOSIS — G20A1 Parkinson's disease without dyskinesia, without mention of fluctuations: Secondary | ICD-10-CM

## 2022-09-14 MED ORDER — CARBIDOPA-LEVODOPA 25-100 MG PO TABS: ORAL_TABLET | ORAL | 1 refills | Status: DC

## 2022-09-14 NOTE — Patient Instructions (Signed)
Local and Online Resources for Power over Parkinson's Group  June 2024   LOCAL [place] PARKINSON'S GROUPS   Power over Parkinson's Group:    Power Over Parkinson's Patient Education Group will be Wednesday, JULY 10th-*PLEASE NOTE:  We will not be having a June meeting.  Our next meeting will be in July.  We apologize for the inconvenience.   Power over Parkinson's and Care Partner Groups will meet together, with plans for separate break out session for caregivers, depending on topic/speaker Upcoming Power over Parkinson's Meetings/Care Partner Support:  2nd Wednesdays of the month at 2 pm:   No regular June meeting, July 10th  Contact Amy Marriott at [email or web address] or Sarah Chambers at [email or web address] if interested in participating in this group    LOCAL EVENTS AND NEW OFFERINGS  PLEASE NOTE:  There will be no June Power over Parkinson's meeting (NO MEETING June 12th) Picnic Party for Parkinson's.  [facility] Movement Disorders Program Celebration.  June 19th 1-3 pm.  Contact Sarah Chambers at [email or web address] Parkinson's Social Game Night.  First Thursday of each month, 2:00-4:00 pm.  *Next date is June 6th*.  [facility], [place].  Contact [email or web address] if interested. Parkinson's CarePartner Group for Men is in the works, if interested email Sarah  [email or web address] ACT [employer] Chair Yoga classes "Train and Gain", Fridays 10 am, [employer].  Contact Gina at [phone].  Community Fitness Instructor-Led Parkinson's Exercises Classes offering at [employer]!  TUESDAYS (Chair Yoga)  and Wednesdays (PWR! Moves)  1:00 pm.   Contact Christy Weaver at  [email or web address]  or [phone]  [place] PWR! Moves classes.  Thursdays at 11:45, [employer].  Free to participate through Parkinson Foundation grant.  Contact Sarah Chambers at [email or web address] or  [phone] to register Drumming for Parkinson's will be held on 2nd and 4th Mondays at 11:00 am.   Located at the [employer] ([address] [place].)  Contact Jane Maydian at [email or web address] or [phone]  [employer] Parkinson's Tai Chi Class, Mondays at 11 am.  Call [phone] for details   ONLINE EDUCATION AND SUPPORT  Parkinson Foundation:  www.parkinson.org  PD Health at Home continues:  Mindfulness Mondays, Wellness Wednesdays, Fitness Fridays  (PWR! Moves as part of Fitness Fridays March 22nd, 1-1:45 pm) Upcoming Education:   Current and Emerging Methods to Aid Parkinson's Diagnosis.  Wednesday, May 29th, 1-2 pm Recognize and Respond to Parkinson's Psychosis.  Wednesday, June 5th, 1-2 pm Parkinsonisms.  Wednesday, June 12th, 1-2 pm Expert Briefing:  Addressing the Challenge of Apathy in Parkinson's.  Wednesday, September 11th, 1-2 pm. Register for virtual education and expert briefings (webinars) at www.parkinson.org/resources-support/online-education Please check out their website to sign up for emails and see their full online offerings     Michael [employer] Foundation:  www.michaeljfox.org   Third Thursday Webinars:  On the third Thursday of every month at 12 p.m. ET, join our free live webinars to learn about various aspects of living with Parkinson's disease and our work to speed medical breakthroughs.  Upcoming Webinar:  You Want to Volunteer for Parkinson's Research: Now What?  Thursday, June 20th at 12 noon. Check out additional information on their website to see their full online offerings    Davis Phinney Foundation:  www.davisphinneyfoundation.org  Upcoming Webinar:  Living Safely with Parkinson's Inside and Outside of your Home.  Thursday, June 13th , 3 pm Series:  Living with Parkinson's Meetup.   Third Thursdays each month, 3 pm    Care Partner Monthly Meetup.  With Connie Carpenter Phinney.  First Tuesday of each month, 2  pm  Check out additional information to Live Well Today on their website    Parkinson and Movement Disorders (PMD) Alliance:  www.pmdalliance.org  [employer]:  Online Education Events  Sign up for emails, which are sent weekly to give you updates on programming and online offerings    Parkinson's Association of [place]:  www.parkinsonassociation.org  Information on online support groups, education events, and online exercises including Yoga, Parkinson's exercises and more-LOTS of information on links to PD resources and online events  Virtual Support Group through Parkinson's Association of [place]; next one is scheduled for Wednesday, June 5th   MOVEMENT AND EXERCISE OPPORTUNITIES  Parkinson's Exercise Class offerings at [employer]. *TUESDAYS* (Chair yoga) and Wednesdays (PWR! Moves)  1:00 pm.   *Please note that PWR! Moves [place] class has ended.  Please consider trying PWR! Moves on Wednesdays at 1 pm at [employer].  Contact Christy Weaver at [email or web address]    Parkinson's Wellness Recovery (PWR! Moves)  www.pwr4life.org  Info on the PWR! Virtual Experience:  You will have access to our expertise?through self-assessment, guided plans that start with the PD-specific fundamentals, educational content, tips, Q&A with an expert, and a growing library of PD-specific pre-recorded and live exercise classes of varying types and intensity - both physical and cognitive! If that is not enough, we offer 1:1 wellness consultations (in-person or virtual) to personalize your PWR! Virtual Experience.   Parkinson Foundation Fitness Fridays:   As part of the PD Health @ Home program, this free video series focuses each week on one aspect of fitness designed to support people living with Parkinson's.? These weekly videos highlight the Parkinson Foundation fitness guidelines for people with Parkinson's disease.   www.parkinson.org/resources-support/online-education/pdhealth#ff  Dance for PD website is offering free, live-stream classes throughout the week, as well as links to digital library of classes:  https://danceforparkinsons.org/  Virtual dance and Pilates for Parkinson's classes: Click on the Community Tab> Parkinson's Movement Initiative Tab.  To register for classes and for more information, visit www.americandancefestival.org and click the "community" tab.   [employer] Parkinson's Cycling Classes   [employer]:  Thursdays @ Noon-Live classes at [employer] (Contact Margaret Hazen at [email or web address]?or [phone])  [employer]: Classes Tuesday, Wednesday and Thursday (contact Marlee at [email or web address] ?or [phone])  [place] [employer] Boxing  Varied levels of classes are offered Tuesdays and Thursdays at [employer] [facility] [facility].   Stretching with Maria weekly class is also offered for people with Parkinson's  To observe a class or for more information, call [phone] or email Hillary Savage at [email or web address]   ADDITIONAL SUPPORT AND RESOURCES  [employer]:  Online Caregiver Education Opportunities:  www.well-springsolutions.org/caregiver-education/caregiver-support-group.  You may also contact Jodi Kolada at [email or web address] or [phone].     [employer] Navigator:  Just1Navigator program, a?free service to help individuals and families through the journey of determining care for older adults.  The "Navigator" is a social worker, Nicole Reynolds, who will speak with a prospective client and/or loved ones to provide an assessment of the situation and a set of recommendations for a personalized care plan -- all free of charge, and whether?[employer] offers the needed service or not. If the need is not a service we provide, we are well-connected with reputable programs in town that we can refer you to.   www.well-springsolutions.org or to speak with the Navigator, call [phone].     

## 2022-10-06 ENCOUNTER — Ambulatory Visit: Payer: Medicare PPO | Admitting: Physician Assistant

## 2023-01-22 ENCOUNTER — Emergency Department (HOSPITAL_COMMUNITY)
Admission: EM | Admit: 2023-01-22 | Discharge: 2023-01-22 | Disposition: A | Discharge status: Home / Self Care | Payer: Medicare PPO | Attending: Emergency Medicine | Admitting: Emergency Medicine

## 2023-01-22 ENCOUNTER — Encounter (HOSPITAL_COMMUNITY): Payer: Self-pay

## 2023-01-22 ENCOUNTER — Other Ambulatory Visit: Payer: Self-pay

## 2023-01-22 DIAGNOSIS — R55 Syncope and collapse: Secondary | ICD-10-CM | POA: Diagnosis present

## 2023-01-22 DIAGNOSIS — Z91040 Latex allergy status: Secondary | ICD-10-CM | POA: Insufficient documentation

## 2023-01-22 DIAGNOSIS — I10 Essential (primary) hypertension: Secondary | ICD-10-CM | POA: Insufficient documentation

## 2023-01-22 DIAGNOSIS — Z79899 Other long term (current) drug therapy: Secondary | ICD-10-CM | POA: Insufficient documentation

## 2023-01-22 LAB — COMPREHENSIVE METABOLIC PANEL
ALT: 7 U/L (ref 0–44)
AST: 23 U/L (ref 15–41)
Albumin: 4.2 g/dL (ref 3.5–5.0)
Alkaline Phosphatase: 58 U/L (ref 38–126)
Anion gap: 10 (ref 5–15)
BUN: 21 mg/dL (ref 8–23)
CO2: 24 mmol/L (ref 22–32)
Calcium: 9.1 mg/dL (ref 8.9–10.3)
Chloride: 100 mmol/L (ref 98–111)
Creatinine, Ser: 1.26 mg/dL — ABNORMAL HIGH (ref 0.44–1.00)
GFR, Estimated: 44 mL/min — ABNORMAL LOW (ref >60)
Glucose, Bld: 105 mg/dL — ABNORMAL HIGH (ref 70–99)
Potassium: 3.6 mmol/L (ref 3.5–5.1)
Sodium: 134 mmol/L — ABNORMAL LOW (ref 135–145)
Total Bilirubin: 0.8 mg/dL (ref 0.3–1.2)
Total Protein: 7.8 g/dL (ref 6.5–8.1)

## 2023-01-22 LAB — CBC WITH DIFFERENTIAL/PLATELET
Abs Immature Granulocytes: 0.01 10*3/uL (ref 0.00–0.07)
Basophils Absolute: 0 10*3/uL (ref 0.0–0.1)
Basophils Relative: 1 %
Eosinophils Absolute: 0.1 10*3/uL (ref 0.0–0.5)
Eosinophils Relative: 3 %
HCT: 34.5 % — ABNORMAL LOW (ref 36.0–46.0)
Hemoglobin: 11.4 g/dL — ABNORMAL LOW (ref 12.0–15.0)
Immature Granulocytes: 0 %
Lymphocytes Relative: 24 %
Lymphs Abs: 1.3 10*3/uL (ref 0.7–4.0)
MCH: 31.4 pg (ref 26.0–34.0)
MCHC: 33 g/dL (ref 30.0–36.0)
MCV: 95 fL (ref 80.0–100.0)
Monocytes Absolute: 0.5 10*3/uL (ref 0.1–1.0)
Monocytes Relative: 10 %
Neutro Abs: 3.2 10*3/uL (ref 1.7–7.7)
Neutrophils Relative %: 62 %
Platelets: 188 10*3/uL (ref 150–400)
RBC: 3.63 MIL/uL — ABNORMAL LOW (ref 3.87–5.11)
RDW: 13.6 % (ref 11.5–15.5)
WBC: 5.1 10*3/uL (ref 4.0–10.5)
nRBC: 0 % (ref 0.0–0.2)

## 2023-01-22 MED ORDER — PANTOPRAZOLE SODIUM 40 MG IV SOLR
40.0000 mg | Freq: Once | INTRAVENOUS | Status: AC
  Administered 2023-01-22: 40 mg via INTRAVENOUS
  Filled 2023-01-22: qty 10

## 2023-01-22 MED ORDER — SODIUM CHLORIDE 0.9 % IV BOLUS
1000.0000 mL | Freq: Once | INTRAVENOUS | Status: AC
  Administered 2023-01-22: 1000 mL via INTRAVENOUS

## 2023-01-22 MED ORDER — ONDANSETRON HCL 4 MG/2ML IJ SOLN
4.0000 mg | Freq: Once | INTRAMUSCULAR | Status: AC
Start: 2023-01-22 — End: 2023-01-22
  Administered 2023-01-22: 4 mg via INTRAVENOUS
  Filled 2023-01-22: qty 2

## 2023-01-22 NOTE — ED Provider Notes (Signed)
[facility]GENC[facility] DEPART[facility] AT [facility] Provider Note   CSN: [id number] Arrival date & time: 01/22/23  1722     History {Add pertinent medical, surgical, social history, OB history to HPI:1} Chief Complaint  Patient presents with   Loss of Consciousness    Sally Diaz is a 77 y.o. female.  Patient has a history of hypertension and GERD.  She states she has been standing outside for an hour and then felt dizzy and weak but did not pass out   Loss of Consciousness      Home Medications Prior to Admission medications   Medication Sig Start Date End Date Taking? Authorizing Provider  Caraway Oil-Levomenthol (FDGARD PO) Take 1 tablet by mouth daily.    [provider]  carbidopa-levodopa (SINEMET IR) 25-100 MG tablet 1 at 7am/11am/4pm 09/14/22   Tat, Octaviano Batty, DO  celecoxib (CELEBREX) 200 MG capsule Take 200 mg by mouth 2 (two) times daily as needed. 07/15/21   [provider]  Cholecalciferol (VITAMIN D3) 5000 units TBDP Take 1 tablet by mouth daily.    [provider]  clobetasol ointment (TEMOVATE) 0.05 % as needed.  07/06/16   [provider]  Co-Enzyme Q-10 30 MG CAPS Take 100 mg by mouth.    [provider]  diclofenac Sodium (VOLTAREN) 1 % GEL as needed. 07/15/21   [provider]  fish oil-omega-3 fatty acids 1000 MG capsule Take 2 capsules by mouth daily. 1 daily    [provider]  Golimumab (SIMPONI ARIA IV) Inject into the vein. Every 8 weeks    [provider]  hydroxychloroquine (PLAQUENIL) 200 MG tablet TAKE 2 TABLETS MONDAY THROUGH FRIDAY BUT NONE ON SATURDAY OR SUNDAY. Patient taking differently: Take 400 mg by mouth 2 (two) times daily. Every day even weekends 04/20/17   Pollyann Savoy, MD  irbesartan (AVAPRO) 75 MG tablet Take 300 mg by mouth daily. MD recommendation to go up to 300 mg 01/17/19   [provider]  LIOTHYRONINE SODIUM PO Take 1 tablet by mouth  daily. T4, T3 compounded medication    [provider]  Nutritional Supplements (DHEA) 15-50 MG CAPS Take 15 mg by mouth daily.    [provider]  omeprazole (PRILOSEC) 20 MG capsule Take 1 capsule (20 mg total) by mouth daily. Take 30 minutes before lunch 04/05/22   Napoleon Form, MD  pravastatin (PRAVACHOL) 40 MG tablet 1 tablet    [provider]  sucralfate (CARAFATE) 1 g tablet Take 1 tablet (1 g total) by mouth 2 (two) times daily. Patient not taking: Reported on 09/14/2022 03/25/20   Napoleon Form, MD  tiZANidine (ZANAFLEX) 2 MG tablet Take by mouth as needed. 08/25/21   [provider]  vitamin E 400 UNIT capsule Take by mouth.    [provider]      Allergies    Latex, Methotrexate, Methotrexate derivatives, Neomycin-bacitracin zn-polymyx, and Tetracycline    Review of Systems   Review of Systems  Cardiovascular:  Positive for syncope.    Physical Exam Updated Vital Signs BP (!) 157/63   Pulse 66   Temp 97.6 F (36.4 C) (Oral)   Resp 16   Ht 5\' 2"  (1.575 m)   Wt 87 kg   SpO2 100%   BMI 35.08 kg/m  Physical Exam  ED Results / Procedures / Treatments   Labs (all labs ordered are listed, but only abnormal results are displayed) Labs Reviewed  CBC WITH  DIFFERENTIAL/PLATELET - Abnormal; Notable for the following components:      Result Value   RBC 3.63 (*)    Hemoglobin 11.4 (*)    HCT 34.5 (*)    All other components within normal limits  COMPREHENSIVE METABOLIC PANEL - Abnormal; Notable for the following components:   Sodium 134 (*)    Glucose, Bld 105 (*)    Creatinine, Ser 1.26 (*)    GFR, Estimated 44 (*)    All other components within normal limits    EKG None  Radiology No results found.  Procedures Procedures  {Document cardiac monitor, telemetry assessment procedure when appropriate:1}  Medications Ordered in ED Medications  sodium chloride 0.9 % bolus 1,000 mL (1,000 mLs Intravenous New  Bag/Given 01/22/23 1802)  ondansetron (ZOFRAN) injection 4 mg (4 mg Intravenous Given 01/22/23 1800)  pantoprazole (PROTONIX) injection 40 mg (40 mg Intravenous Given 01/22/23 1758)    ED Course/ Medical Decision Making/ A&P   {   Click here for ABCD2, HEART and other calculatorsREFRESH Note before signing :1}                              Medical Decision Making Amount and/or Complexity of Data Reviewed Labs: ordered. ECG/medicine tests: ordered.  Risk Prescription drug management.   Near syncopal episode secondary to dehydration.  Patient improved with IV fluids will be discharged home to follow-up with her PCP  {Document critical care time when appropriate:1} {Document review of labs and clinical decision tools ie heart score, Chads2Vasc2 etc:1}  {Document your independent review of radiology images, and any outside records:1} {Document your discussion with family members, caretakers, and with consultants:1} {Document social determinants of health affecting pt's care:1} {Document your decision making why or why not admission, treatments were needed:1} Final Clinical Impression(s) / ED Diagnoses Final diagnoses:  Near syncope    Rx / DC Orders ED Discharge Orders     None

## 2023-01-22 NOTE — ED Triage Notes (Signed)
Pt BIBA for near syncopal episode.  Pt states " [place] became [place], dizzy and eyes felt odd.  She sat down, vomited"  Pt denies recent illness, use of blood thinners  Pt A&O x 4

## 2023-01-22 NOTE — Discharge Instructions (Signed)
Drink plenty of fluids and follow-up with your doctor if any problems. °

## 2023-03-01 ENCOUNTER — Other Ambulatory Visit: Payer: Self-pay | Admitting: *Deleted

## 2023-03-01 DIAGNOSIS — I7121 Aneurysm of the ascending aorta, without rupture: Secondary | ICD-10-CM

## 2023-03-02 ENCOUNTER — Encounter: Payer: Self-pay | Admitting: Cardiology

## 2023-03-22 NOTE — Progress Notes (Signed)
 Assessment/Plan:   1.  ET/PD  -DaTscan  correlates with symptoms.  DaTscan  demonstrated decreased activity in the left putamen.  -Patient's father diagnosed with Parkinson's disease in his later life as well.    -Continue carbidopa /levodopa  1 po tid.  No SE.  Sally Diaz taking.  -She follows Sally Diaz with dermatology. -she looks good today -increase exercise    Subjective:   Sally Diaz was seen today in follow up for ET/PD.  She has had no falls.  She was in the emergency room November 2 with near syncope.  Records are reviewed.  ER records state that patient had been standing outside for an hour waiting in a line and then felt weak and dizzy and told her husband that she needed to sit or she would fall.  There was no loss of consciousness.  She did throw up right when she was helped to the ground.  Emergency room blood pressure was actually elevated.  They felt symptoms were due to dehydration.  She is taking her levodopa  Sally Diaz.  Current movement disorder medications: Carbidopa /levodopa  25/100, 1 tablet 3 times per day  ALLERGIES:   Allergies  Allergen Reactions   Latex Rash   Methotrexate  Rash   Methotrexate  Derivatives Rash   Neomycin-Bacitracin Zn-Polymyx Rash   Tetracycline Swelling    CURRENT MEDICATIONS:  Current Meds  Medication Sig   Caraway Oil-Levomenthol (FDGARD PO) Take 1 tablet by mouth daily.   carbidopa -levodopa  (SINEMET  IR) 25-100 MG tablet 1 at 7am/11am/4pm   celecoxib (CELEBREX) 200 MG capsule Take 200 mg by mouth 2 (two) times daily as needed.   Cholecalciferol (VITAMIN D3) 5000 units TBDP Take 1 tablet by mouth daily.   clobetasol ointment (TEMOVATE) 0.05 % as needed.    Co-Enzyme Q-10 30 MG CAPS Take 100 mg by mouth.   diclofenac  Sodium (VOLTAREN ) 1 % GEL as needed.   fish oil-omega-3 fatty acids 1000 MG capsule Take 2 capsules by mouth daily. 1 daily   Golimumab (SIMPONI ARIA IV) Inject into the vein. Every 8 weeks   hydroxychloroquine   (PLAQUENIL ) 200 MG tablet TAKE 2 TABLETS MONDAY THROUGH FRIDAY BUT NONE ON SATURDAY OR SUNDAY. (Patient taking differently: Take 400 mg by mouth 2 (two) times daily. Every day even weekends)   irbesartan (AVAPRO) 75 MG tablet Take 300 mg by mouth daily. MD recommendation to go up to 300 mg   LIOTHYRONINE SODIUM PO Take 1 tablet by mouth daily. T4, T3 compounded medication   omeprazole  (PRILOSEC ) 20 MG capsule Take 1 capsule (20 mg total) by mouth daily. Take 30 minutes before lunch (Patient taking differently: Take 40 mg by mouth daily. Take 30 minutes before lunch)   pravastatin (PRAVACHOL) 40 MG tablet 1 tablet   sucralfate  (CARAFATE ) 1 g tablet Take 1 tablet (1 g total) by mouth 2 (two) times daily. (Patient taking differently: Take 1 g by mouth as needed.)   tiZANidine  (ZANAFLEX ) 2 MG tablet Take by mouth as needed.   Current Facility-Administered Medications for the 03/25/23 encounter (Office Visit) with Mikayah Joy, Asberry RAMAN, DO  Medication   0.9 %  sodium chloride  infusion     Objective:   PHYSICAL EXAMINATION:    VITALS:   Vitals:   03/25/23 1108  BP: (!) 142/72  Pulse: 85  SpO2: 98%  Weight: 184 lb 9.6 oz (83.7 kg)  Height: 5' 3 (1.6 m)   Wt Readings from Last 3 Encounters:  03/25/23 184 lb 9.6 oz (83.7 kg)  01/22/23 191 lb 12.8 oz (87  kg)  09/14/22 193 lb 6.4 oz (87.7 kg)     GEN:  The patient appears stated age and is in NAD. HEENT:  Normocephalic, atraumatic.  The mucous membranes are moist. The superficial temporal arteries are without ropiness or tenderness. CV:  rrr Lungs:  CTAB   Neurological examination:  Orientation: The patient is alert and oriented x3. Cranial nerves: There is good facial symmetry without facial hypomimia. The speech is fluent and clear. Soft palate rises symmetrically and there is no tongue deviation. Hearing is intact to conversational tone. Sensation: Sensation is intact to light touch throughout Motor: Strength is at least antigravity  x4.  Movement examination: Tone: There is normal tone in the bilateral upper extremities.  The tone in the lower extremities is normal.  Abnormal movements: There is no tremor today Coordination:  There is no decremation, with any form of RAMS, including alternating supination and pronation of the forearm, hand opening and closing, finger taps, heel taps and toe taps. Gait and Station: The patient has no difficulty arising out of a deep-seated chair without the use of the hands. The patient's stride length is good.    (Exam is fully stable)   Cc:  Sigrid Deidra Fox, MD

## 2023-03-25 ENCOUNTER — Ambulatory Visit: Payer: Medicare PPO | Admitting: Neurology

## 2023-03-25 ENCOUNTER — Encounter: Payer: Self-pay | Admitting: Neurology

## 2023-03-25 VITALS — BP 142/72 | HR 85 | Ht 63.0 in | Wt 184.6 lb

## 2023-03-25 DIAGNOSIS — G20A1 Parkinson's disease without dyskinesia, without mention of fluctuations: Secondary | ICD-10-CM | POA: Diagnosis not present

## 2023-03-25 MED ORDER — CARBIDOPA-LEVODOPA 25-100 MG PO TABS: ORAL_TABLET | ORAL | 1 refills | Status: DC

## 2023-03-25 NOTE — Patient Instructions (Signed)

## 2023-03-29 ENCOUNTER — Ambulatory Visit
Admission: RE | Admit: 2023-03-29 | Discharge: 2023-03-29 | Disposition: A | Discharge status: Home / Self Care | Payer: Medicare PPO | Source: Ambulatory Visit | Attending: Cardiology | Admitting: Cardiology

## 2023-03-29 DIAGNOSIS — I7121 Aneurysm of the ascending aorta, without rupture: Secondary | ICD-10-CM

## 2023-03-29 MED ORDER — IOPAMIDOL (ISOVUE-370) INJECTION 76%
500.0000 mL | Freq: Once | INTRAVENOUS | Status: AC | PRN
  Administered 2023-03-29: 75 mL via INTRAVENOUS

## 2023-04-12 ENCOUNTER — Ambulatory Visit: Payer: Medicare PPO | Attending: Neurology | Admitting: Physical Therapy

## 2023-04-12 ENCOUNTER — Ambulatory Visit: Payer: Medicare PPO | Admitting: Occupational Therapy

## 2023-04-12 ENCOUNTER — Ambulatory Visit: Payer: Medicare PPO

## 2023-04-12 DIAGNOSIS — R2689 Other abnormalities of gait and mobility: Secondary | ICD-10-CM | POA: Insufficient documentation

## 2023-04-12 DIAGNOSIS — R29818 Other symptoms and signs involving the nervous system: Secondary | ICD-10-CM

## 2023-04-12 DIAGNOSIS — R471 Dysarthria and anarthria: Secondary | ICD-10-CM | POA: Insufficient documentation

## 2023-04-12 NOTE — Therapy (Signed)
[facility] [address] [place], [place], [address] Phone: [phone]   Fax:  [phone]  Patient Details  Name: Sally Diaz MRN: [id number] Date of Birth: Sep 21, 1945 Referring Provider:  Corliss Blacker, *  Encounter Date: 04/12/2023  Physical Therapy Parkinson's Disease Screen   Timed Up and Go test:10.85 sec (compared to 9.27 sec)  10 meter walk test: 8.69 sec (3.77 ft/sec) (compared to 4.37 ft/sec )  5 time sit to stand test:7.81 sec (compared to 8 sec)   Patient does not require Physical Therapy services at this time.  Recommend Physical Therapy screen in 6-9 months.  No falls, continuing to exercise.  Siah Steely W., PT 04/12/2023, 10:21 AM  [place]  [facility] [address], [address] [place], [place], [address] Phone: [phone]   Fax:  [phone]

## 2023-04-12 NOTE — Therapy (Signed)
[facility] [address] [place], [place], [address] Phone: [phone]   Fax:  [phone]  Patient Details  Name: Sally Diaz MRN: [id number] Date of Birth: [birth year] Referring Provider:  Kerin Salen, DO   Encounter Date: 04/12/23 Speech Therapy Parkinson's Disease Screen         Decibel Level today: 70dB  (WNL=70-72 dB) with sound level meter 30cm away from pt's mouth (last measured speech volume in April 2024 was low 70s dB). Pt's conversational volume has remained unchanged since last screen.    Pt does not not report difficulty with swallowing, which does not warrant further evaluation   Pt does does not require speech therapy services at this time. Recommend ST screen in another 6-8 months.  Bravery Ketcham, CCC-SLP 04/12/2023, 11:58 AM  [place]  [facility] [address] [place], [place], [address] Phone: [phone]   Fax:  [phone]

## 2023-04-12 NOTE — Therapy (Signed)
[place] [facility] [address] [place], [place], [address] Phone: [phone]   Fax:  [phone]  Patient Details  Name: Sally Diaz MRN: [id number] Date of Birth: 07-13-1945 Referring Provider:  Corliss Blacker, *  Encounter Date: 04/12/2023  Occupational Therapy Parkinson's Disease Screen  Hand dominance:  right   Physical Performance Test item #1: 19.62 sec (in cursive).  Pt reports writing in print more often, therefore completed again in print in 15.40 sec.  100% legible with good sizing.  Physical Performance Test item #2 (simulated eating):  12.97 sec  9-hole peg test:    RUE  27.18 sec        LUE  25.87 sec  Box & Blocks Test:   RUE  53 blocks        LUE  52 blocks  Other Comments:  Pt reports occasional difficulty opening a little bottle.  Pt does not require occupational therapy services at this time.  Recommended occupational therapy screen in   6-9 months   Tahjir Silveria, OTR/L 04/12/2023, 11:44 AM  [place] [facility] [address], [address] [place], [place], [address] Phone: [phone]   Fax:  [phone]

## 2023-04-19 NOTE — Progress Notes (Addendum)
HPI F never smoker followed for Chronic Cough, complicated by HTN, Allergic Rhinitis, Asthma, GERD, Diverticulosis, Hypothyroid, Parkinson's, Rheumatoid Arthritis,Degenerative Disc Disease,  Obesity, Hypercholesterolemia, Osteoarthritis, Aortic Aneurysm,  ===============================================================    04/19/22-  76 yoF never smoker followed for Chronic Cough, complicated by HTN, Allergic Rhinitis, Asthma, GERD, Diverticulosis, Hypothyroid, Parkinson's, Rheumatoid Arthritis,Degenerative Disc Disease,  Obesity, Hypercholesterolemia, Osteoarthritis, Aortic Aneurysm,  -loratadine,  albuterol hfa, omeprazole,  Covid vax-4 Phizer Flu vax- had  Has seen GI for GERD. Working with Neurology for [employer]. Says she is doing well with asthmatic bronchitis/ cough. Has only rescue inhaler- rarely used. Trelegy sample ok but not needed. Learned to wear mask if raking leaves.  04/21/23- 77 yoF never smoker followed for Chronic Cough, complicated by HTN, Allergic Rhinitis, Asthma, GERD, Diverticulosis, Hypothyroid, Parkinson's, Rheumatoid Arthritis,Degenerative Disc Disease,  Obesity, Hypercholesterolemia, Osteoarthritis, Aortic Aneurysm,  -loratadine,  albuterol hfa, omeprazole,  Discussed the use of AI scribe software for clinical note transcription with the patient, who gave verbal consent to proceed.  History of Present Illness   The patient, with a history of COPD, presents with morning congestion and difficulty breathing. She describes the sensation as 'hard to breathe' and 'stuffy,' which she attributes to nasal congestion. She denies associated headaches. She has not used her albuterol inhaler since last year and has not needed it. She has previously used Flonase for nasal symptoms and is considering restarting it.  The patient also reports difficulty singing due to 'junk' in her throat. She is unsure if this is related to her COPD or another issue. She has not noticed this issue  before and does not regularly sing.  The patient's breathing has been feeling good. She has a slight heart murmur, but it is not considered significant.        CT aorta 03/29/23 Lungs/Pleura: Lungs are clear. No pleural effusion or pneumothorax.   ROS-see HPI Constitutional:   No-   weight loss, night sweats, fevers, chills, fatigue, lassitude. HEENT:   + headaches, difficulty swallowing, tooth/dental problems, sore throat,       No-  sneezing, itching, ear ache, +nasal congestion, post nasal drip,  CV:  No-   chest pain, orthopnea, PND, swelling in lower extremities, anasarca,                                                         dizziness, palpitations Resp: No-   shortness of breath with exertion or at rest.              No-productive cough,  + non-productive cough,  No- coughing up of blood.              No-   change in color of mucus.  +-wheezing.   Skin: No-   rash or lesions. GI:  + heartburn, indigestion, abdominal pain, nausea, vomiting, GU:  MS:  No-   joint pain or swelling.   Neuro-     nothing unusual Psych:  No- change in mood or affect. No depression or anxiety.  No memory loss.  OBJ- Physical Exam General- Alert, Oriented, Affect-appropriate, Distress- none acute, +overweight Skin- rash-none, lesions- none, excoriation- none Lymphadenopathy- none Head- atraumatic            Eyes- Gross vision intact, PERRLA, conjunctivae and secretions clear  Ears- Hearing, canals-normal            Nose- n, no-Septal dev, mucus, polyps, erosion, perforation ,             Throat- Mallampati II , mucosa, drainage- none, tonsils- atrophic. +hoarse Neck- flexible , trachea midline, no stridor , thyroid nl, carotid no bruit Chest - symmetrical excursion , unlabored           Heart/CV- RRR , + murmur +1/6S , no gallop  , no rub, nl s1 s2                           - JVD- none , edema- none, stasis changes- none, varices- none           Lung-  no wheeze, cough -none ,  dullness-none, rub- none           Chest wall-  Abd-  Br/ Gen/ Rectal- Not done, not indicated Extrem- cyanosis- none, clubbing, none, atrophy- none, strength- nl Neuro- +tremor  Assessment and Plan    Chronic Obstructive Pulmonary Disease (COPD) No recent use of albuterol inhaler. Reports morning congestion and difficulty breathing, possibly related to nasal congestion. No wheezing, coughing, or shortness of breath. No abnormal lung sounds on examination. -Continue current management. -Consider use of albuterol inhaler prior to activities that may exacerbate symptoms (e.g., singing). -Consider use of Mucinex for mucus loosening.  Nasal Congestion Reports morning nasal congestion, possibly related to dry indoor heat. Previously used [employer] with success. -Start Flonase, 2 squirts in each nostril at bedtime. -Consider over-the-counter saline nasal sprays or rinses. -Stay hydrated, particularly in the evening.  Heart Murmur New finding on examination. Likely benign and not hemodynamically significant. -Track for changes in future examinations.

## 2023-04-21 ENCOUNTER — Ambulatory Visit: Payer: Medicare PPO | Admitting: Internal Medicine

## 2023-04-21 ENCOUNTER — Encounter: Payer: Self-pay | Admitting: Internal Medicine

## 2023-04-21 VITALS — BP 130/80 | HR 80 | Temp 97.7°F | Ht 63.0 in | Wt 186.8 lb

## 2023-04-21 DIAGNOSIS — J449 Chronic obstructive pulmonary disease, unspecified: Secondary | ICD-10-CM | POA: Diagnosis not present

## 2023-04-21 DIAGNOSIS — R011 Cardiac murmur, unspecified: Secondary | ICD-10-CM

## 2023-04-21 DIAGNOSIS — R0981 Nasal congestion: Secondary | ICD-10-CM

## 2023-04-21 DIAGNOSIS — J4520 Mild intermittent asthma, uncomplicated: Secondary | ICD-10-CM

## 2023-04-21 MED ORDER — ALBUTEROL SULFATE HFA 108 (90 BASE) MCG/ACT IN AERS: 2.0000 | INHALATION_SPRAY | Freq: Four times a day (QID) | RESPIRATORY_TRACT | 6 refills | Status: DC | PRN

## 2023-04-21 MED ORDER — ALBUTEROL SULFATE HFA 108 (90 BASE) MCG/ACT IN AERS: 2.0000 | INHALATION_SPRAY | Freq: Four times a day (QID) | RESPIRATORY_TRACT | Status: AC | PRN

## 2023-04-21 NOTE — Patient Instructions (Addendum)
Albuterol inhaler renewed- call if you need it sent to drug store  Try Mucinex  otc to loosen mucus in your airways  Try using Flonase at bedtime, 2 puffs each nostril. After a few days, see if this helps the morning stuffy nose.  Please call if we can help

## 2023-04-25 ENCOUNTER — Telehealth: Payer: Self-pay

## 2023-04-25 ENCOUNTER — Other Ambulatory Visit: Payer: Self-pay

## 2023-04-25 DIAGNOSIS — I7121 Aneurysm of the ascending aorta, without rupture: Secondary | ICD-10-CM

## 2023-04-25 NOTE — Telephone Encounter (Signed)
Left vmm for call back. Letter sent to with results and new order for repeat CT.

## 2023-05-09 ENCOUNTER — Other Ambulatory Visit: Payer: Self-pay | Admitting: Gastroenterology

## 2023-08-04 ENCOUNTER — Other Ambulatory Visit: Payer: Self-pay | Admitting: Gastroenterology

## 2023-09-16 NOTE — Progress Notes (Signed)
 Cardiology Consult Note  Cardiology office attending: Aretta Race, MD    Referring Physician:  Pura Alm BRAVO, MD  Chief Complaint/Reason for Consult:  ER follow up for chest pain    Problem List Items Addressed This Visit     Hyperlipidemia   She is on Pravastatin.       Relevant Medications   pravastatin (PRAVACHOL) 40 MG tablet   Other Relevant Orders   Echocardiogram W Colorflow Spectral Doppler   Hypertension, benign   BP is stable on her current regimen.       Relevant Medications   irbesartan (AVAPRO) 300 MG tablet   Other Relevant Orders   Echocardiogram W Colorflow Spectral Doppler   Rheumatoid arthritis      Stable according to her report. In light of her recent chest pain symptoms, will get an echo to rule out pericarditis.        Relevant Medications   celecoxib (CELEBREX) 200 MG capsule   Chest pain - Primary   Recent ER visit for left sided chest pain that worsened with inspiration, but has been ongoing for several weeks. She also reports feeling winded at times.  Positive calcium score of 226 Symptoms sound musculoskeletal in origin, but I will schedule her for an exercise nuclear stress test for further evaluation.       Relevant Orders   ECG 12 Lead (Completed)   Echocardiogram W Colorflow Spectral Doppler   NM Myocardial Perfusion Spect Multiple   Coronary artery disease   Positive calcium score of 226 in 2024.       Relevant Medications   celecoxib (CELEBREX) 200 MG capsule   alendronate (FOSAMAX) 70 MG tablet   irbesartan (AVAPRO) 300 MG tablet   pravastatin (PRAVACHOL) 40 MG tablet   Other Relevant Orders   Echocardiogram W Colorflow Spectral Doppler   NM Myocardial Perfusion Spect Multiple   Ascending aorta dilation   4.2cm on CT in 2024. Echo ordered today        Relevant Orders   Echocardiogram W Colorflow Spectral Doppler      History of Present Illness:  This is a pleasant 78 year old female who I am seeing today for  an initial consult. She has a past medical history significant for hypertension, dyslipidemia, hypothyroidism, rheumatoid arthritis, Parkinson's disease, and GERD. She denies DM. 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   She presented to [facility] ER on 09/06/2023 with c/o chest pain. 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 She tells me she has been experiencing chest pain the last month, but the pain increased around the time she began doing more than she typically does such as lifting things. Night prior to the ER the pain kept her awake and worsened with inspiration and laying on left side.   She tells me the chest pain does not occur with rest, but if she experiences the pain it does not stop her from doing her activities. Pain is still occurring today. She has trouble describing the pain. She also reports feeling winded at times.   She has a family hx of stoke and CHF.     Past Medical History[1]  Past Surgical History[2]  Family History[3]  Social Connections: Unknown (08/02/2021)   Received from [facility]   Social  Network   . Social Network: Not on file   Allergies[4]  Prior to Admission medications  Medication Sig Start Date End Date Taking? Authorizing Provider  alendronate (FOSAMAX) 70 MG tablet Take 1 tablet (70 mg total) by mouth every seven (7) days. 09/01/23  Yes [provider]  celecoxib (CELEBREX) 200 MG capsule Take 1 capsule (200 mg total) by mouth daily as needed for pain or arthritis. 07/15/21  Yes [provider]  hydroxychloroquine  (PLAQUENIL ) 200 mg tablet 200 mg every morning.  Patient taking differently: Take 1 tablet (200 mg total) by mouth two (2) times a day.   Yes [provider]  omeprazole  (PRILOSEC ) 40 MG capsule Take 1 capsule (40 mg total)  by mouth daily. 12/27/18  Yes [provider]  tizanidine  (ZANAFLEX ) 2 MG tablet Take 1 tablet (2 mg total) by mouth daily as needed. 08/25/21  Yes [provider]  albuterol  (PROVENTIL  HFA;VENTOLIN  HFA) 90 mcg/actuation inhaler Inhale 2 puffs every six (6) hours as needed for wheezing.    [provider]  calcium carbonate (OS-CAL) 600 mg calcium (1,500 mg) tablet Take 1 tablet by mouth Two (2) times a day.    [provider]  carbidopa -levodopa  (SINEMET ) 25-100 mg per tablet Take 1 tablet by mouth Three (3) times a day.    [provider]  chlorthalidone (HYGROTON) 25 MG tablet Take 25 mg by mouth daily as needed. 02/09/16   [provider]  CHOLECALCIFEROL, VITAMIN D3, (CHOLECALCIFEROL, VIT D3,,BULK, MISC) 5,000 mg by Miscellaneous route daily at 0600.    [provider]  clobetasol (TEMOVATE) 0.05 % ointment two (2) times a day as needed.  07/06/16   [provider]  co-enzyme Q-10 30 mg capsule Take 100 mg by mouth daily.     [provider]  GLUTATHIONE ORAL Take by mouth.    [provider]  ibuprofen (ADVIL,MOTRIN) 200 MG tablet Take 4 tablets (800 mg total) by mouth every eight (8) hours as needed for pain or pain,mild (1-3) (pain). 02/17/15   Jeffory Ronal Rummage, MD  irbesartan (AVAPRO) 300 MG tablet Take 1 tablet (300 mg total) by mouth daily.    [provider]  LIOTHYRONINE SODIUM (LIOTHYRONINE, BULK, MISC) Take 11-36 mcg by mouth daily at 0600.    [provider]  omega-3 fatty acids-vitamin E (FISH OIL) 1,000 mg cap 1,000 mg once daily.    [provider]  pravastatin (PRAVACHOL) 40 MG tablet Take 1 tablet (40 mg total) by mouth daily.    [provider]  ranitidine  (ZANTAC ) 75 MG tablet Take 75 mg by mouth Two (2) times a day. 02/11/16   [provider]  vitamin E 400 UNIT capsule Take 400 Units by mouth daily.    [provider]    Review of  Systems GENERAL:  Negative for night sweats, fevers, chills. EYES:  Negative for itchy eyes, retinopathy. GI:  Negative for abdominal pain, constipation. MS:  Negative for muscle pain or bone fractures.     IINTEGUMENT:  Negative for skin cancers, rashes.    NEUROLOGIC:  Negative for headache, seizures, and head trauma.     PSYCHIATRIC:  Negative for depression and anxiety.     ENDOCRINE:  Negative for goiter.     HEMATOLOGIC/LYMPHATIC:  No swollen lymph nodes.     All other systems were reviewed and were negative in detail except as noted in the HPI.  Objective: Vital Signs: BP 132/68 (BP Site: L Arm, BP  Position: Sitting)   Pulse 80   SpO2 100% Comment: room air   I&O Net IO: No IO data has been entered for this period [09/16/23 0933]  LABS: Recent Results (from the past 48 hours)  ECG 12 Lead   Collection Time: 09/16/23  8:42 AM  Result Value Ref Range   EKG Systolic BP  mmHg   EKG Diastolic BP  mmHg   EKG Ventricular Rate 72 BPM   EKG Atrial Rate 72 BPM   EKG P-R Interval 160 ms   EKG QRS Duration 98 ms   EKG Q-T Interval 418 ms   EKG QTC Calculation 457 ms   EKG Calculated P Axis 51 degrees   EKG Calculated R Axis -39 degrees   EKG Calculated T Axis 56 degrees   QTC Fredericia 444 ms     PHYSICAL EXAMINATION: GENERAL: The patient is in no acute distress. EYES: Pupils are equal, round, and reactive to light and accommodation.   NECK:  There is no JVD, or carotid bruits.  HEART: The heart has a regular rate and rhythm. No murmur. No rub.  Normal S1/S2.   LUNGS: Clear to auscultation. There are no rales, rhonchi, or wheezes. ABDOMEN:  The abdomen is soft, nontender, and nondistended. No pulsatile aorta palpated. EXTREMITIES:  No cyanosis. No edema. PULSES:  Pulses are 2+ bilaterally. NEUROLOGIC: No overt neurologic deficits were detected. PSYCH:  Normal judgment and insight, mood is appropriate.  Scribe: I am acting scribe for this note for Dr. Aretta. I have  reviewed the above scribe's documentation for accuracy and completeness and I agree with the above documentation.    Electronically signed by: Katrinka CHRISTELLA Blackwood, CMA 09/16/2023 9:33 AM       [1] Past Medical History: Diagnosis Date  . Abnormal Pap smear of cervix   . Anemia   . 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 Disease of thyroid gland    hypothyroidism  . Hiatal hernia   . HPV (human papilloma virus) infection   . Hypertension    controlled with medication  . Osteoarthritis   . RA (rheumatoid arthritis)      . Raynaud's disease   . Shortness of breath    with activity  [2] Past Surgical History: Procedure Laterality Date  . CATARACT EXTRACTION, BILATERAL    . ENDOMETRIAL ABLATION    . PR CONIZATION CERVIX,KNIFE/LASER N/A 02/17/2015   Procedure: DILATION AND CURETTAGE, CONE BIOPSY, POSSIBLE LOOP ELECTROSURGICAL EXCISION PROCEDURE;  Surgeon: Ronal Estanislado Creed, MD;  Location: [facility];  Service: Gynecology  . PR ENDOCERVICAL CURETTAGE N/A 02/17/2015   Procedure: ENDOCERVICAL CURETTAGE (NOT DONE AS PART OF A DILATION AND CURETTAGE);  Surgeon: Ronal Estanislado Creed, MD;  Location: [facility];  Service: Gynecology  . TONSILLECTOMY    . TONSILLECTOMY AND ADENOIDECTOMY    [3] Family History Problem Relation Age of Onset  . Cancer Mother        Bladder  . Heart failure Mother   . Stroke Father   . Cancer Brother        Lung  [4] Allergies Allergen Reactions  . Tetracyclines Swelling  . Latex Rash  . Methotrexate  Rash  . Neomycin-Bacitracnzn-Polymyxnb Itching

## 2023-09-26 ENCOUNTER — Other Ambulatory Visit: Payer: Self-pay | Admitting: Neurology

## 2023-09-26 DIAGNOSIS — G20A1 Parkinson's disease without dyskinesia, without mention of fluctuations: Secondary | ICD-10-CM

## 2023-09-27 ENCOUNTER — Ambulatory Visit: Payer: Medicare PPO | Admitting: Neurology

## 2023-09-30 ENCOUNTER — Telehealth: Payer: Self-pay

## 2023-09-30 NOTE — Telephone Encounter (Signed)
 Patient has refill in [place] and Dr. Evonnie would like to see her sooner. Called patein and left message we could see her 07/25 VV at 9:00 AM , 10:30 AM , 3:00 PM

## 2023-10-03 ENCOUNTER — Other Ambulatory Visit: Payer: Self-pay

## 2023-10-03 NOTE — Telephone Encounter (Signed)
 Patient called and wanted to tell us  that her husband had been in a car accident and she could not make her appointment. We had tried to make her virtual and she said that she did not get good service at her house so she could not do that. I sent in enough medication to get her to her August 15th appointment one month from now and this is what she told Danna up front who was scheduling the patient

## 2023-10-03 NOTE — Telephone Encounter (Signed)
 [facility] [address] [place]  She said she will try her best to come to her 8/15 appt. She is aware if she doesn't do a VV or come she will not get anymore. She said well that's fine, if Dr.Tat wants to stop the carbi/levo that's fine too.

## 2023-10-04 NOTE — Telephone Encounter (Signed)
 noted

## 2023-11-02 NOTE — Progress Notes (Deleted)
    Assessment/Plan:   1.  ET/PD  -DaTscan  correlates with symptoms.  DaTscan  demonstrated decreased activity in the left putamen.  -Patient's father diagnosed with Parkinson's disease in his later life as well.    -Continue carbidopa /levodopa  1 po tid.  No SE.  Faithfully taking.  -She follows faithfully with dermatology. -she looks good today -increase exercise  2.  Hypertension  - On irbesartan  - Will closely monitor blood pressure given propensity for Parkinsons to cause dysautonomia.  Subjective:   Sally Diaz was seen today in follow up for ET/PD.  Patient continues to take her levodopa  faithfully.  She denies falls.  She denies lightheadedness or near syncope.  She has been under a lot of stress.  Her husband fell off a ladder and has been requiring extensive surgery.  She is a full-time caregiver.  She saw cardiology June 27.  Notes are reviewed.  Current movement disorder medications: Carbidopa /levodopa  25/100, 1 tablet 3 times per day  ALLERGIES:   Allergies  Allergen Reactions   Latex Rash   Methotrexate  Rash   Methotrexate  Derivatives Rash   Neomycin-Bacitracin Zn-Polymyx Rash   Tetracycline Swelling    CURRENT MEDICATIONS:  No outpatient medications have been marked as taking for the 11/04/23 encounter (Appointment) with Shenoa Hattabaugh, Asberry RAMAN, DO.   Current Facility-Administered Medications for the 11/04/23 encounter (Appointment) with Taelor Moncada, Asberry RAMAN, DO  Medication   0.9 %  sodium chloride  infusion     Objective:   PHYSICAL EXAMINATION:    VITALS:   There were no vitals filed for this visit.  Wt Readings from Last 3 Encounters:  04/21/23 186 lb 12.8 oz (84.7 kg)  03/25/23 184 lb 9.6 oz (83.7 kg)  01/22/23 191 lb 12.8 oz (87 kg)     GEN:  The patient appears stated age and is in NAD. HEENT:  Normocephalic, atraumatic.  The mucous membranes are moist. The superficial temporal arteries are without ropiness or tenderness. CV:  rrr Lungs:   CTAB   Neurological examination:  Orientation: The patient is alert and oriented x3. Cranial nerves: There is good facial symmetry without facial hypomimia. The speech is fluent and clear. Soft palate rises symmetrically and there is no tongue deviation. Hearing is intact to conversational tone. Sensation: Sensation is intact to light touch throughout Motor: Strength is at least antigravity x4.  Movement examination: Tone: There is normal tone in the bilateral upper extremities.  The tone in the lower extremities is normal.  Abnormal movements: There is no tremor today Coordination:  There is no decremation, with any form of RAMS, including alternating supination and pronation of the forearm, hand opening and closing, finger taps, heel taps and toe taps. Gait and Station: The patient has no difficulty arising out of a deep-seated chair without the use of the hands. The patient's stride length is good.      Total time spent on today's visit was *** minutes, including both face-to-face time and nonface-to-face time.  Time included that spent on review of records (prior notes available to me/labs/imaging if pertinent), discussing treatment and goals, answering patient's questions and coordinating care.    Cc:  Sigrid Deidra Fox, MD

## 2023-11-02 NOTE — Telephone Encounter (Signed)
 Just noting to myself that pt cx her 8/15 appt

## 2023-11-04 ENCOUNTER — Ambulatory Visit: Admitting: Neurology

## 2023-12-01 ENCOUNTER — Ambulatory Visit: Payer: Medicare PPO | Admitting: Physical Therapy

## 2023-12-01 ENCOUNTER — Ambulatory Visit: Payer: Medicare PPO

## 2023-12-01 ENCOUNTER — Ambulatory Visit: Payer: Medicare PPO | Admitting: Occupational Therapy

## 2024-01-06 ENCOUNTER — Telehealth: Payer: Self-pay | Admitting: Gastroenterology

## 2024-01-06 MED ORDER — OMEPRAZOLE 40 MG PO CPDR: DELAYED_RELEASE_CAPSULE | ORAL | 3 refills | Status: AC

## 2024-01-06 NOTE — Telephone Encounter (Signed)
 Sent Omeprazole  to [facility] today

## 2024-01-06 NOTE — Telephone Encounter (Signed)
 Inbound call from patient requesting a refill for omeprazole . States if cannot be filled today [facility].Please advise, thank you

## 2024-03-27 ENCOUNTER — Encounter: Payer: Self-pay | Admitting: Internal Medicine

## 2024-04-20 ENCOUNTER — Ambulatory Visit: Payer: Medicare PPO | Admitting: Internal Medicine

## 2024-05-07 ENCOUNTER — Encounter
